# Patient Record
Sex: Female | Born: 1970
Health system: Southern US, Community
[De-identification: ages and names within clinical notes are randomized; demographics above are authoritative.]

## PROBLEM LIST (undated history)

## (undated) DIAGNOSIS — C801 Malignant (primary) neoplasm, unspecified: Secondary | ICD-10-CM

## (undated) DIAGNOSIS — Z1371 Encounter for nonprocreative screening for genetic disease carrier status: Secondary | ICD-10-CM

## (undated) DIAGNOSIS — I10 Essential (primary) hypertension: Secondary | ICD-10-CM

## (undated) DIAGNOSIS — K219 Gastro-esophageal reflux disease without esophagitis: Secondary | ICD-10-CM

## (undated) DIAGNOSIS — C50919 Malignant neoplasm of unspecified site of unspecified female breast: Secondary | ICD-10-CM

## (undated) DIAGNOSIS — Z720 Tobacco use: Secondary | ICD-10-CM

## (undated) HISTORY — DX: Essential (primary) hypertension: I10

## (undated) HISTORY — PX: MASTECTOMY: SHX3

## (undated) HISTORY — DX: Encounter for nonprocreative screening for genetic disease carrier status: Z13.71

## (undated) HISTORY — DX: Malignant (primary) neoplasm, unspecified: C80.1

---

## 2013-10-03 HISTORY — PX: BREAST EXCISIONAL BIOPSY: SUR124

## 2014-02-21 HISTORY — PX: BREAST BIOPSY: SHX20

## 2017-04-14 ENCOUNTER — Emergency Department: Payer: Self-pay

## 2017-04-14 ENCOUNTER — Inpatient Hospital Stay
Admission: EM | Admit: 2017-04-14 | Discharge: 2017-04-16 | DRG: 305 | Disposition: A | Discharge status: Home / Self Care | Payer: Self-pay | Attending: Internal Medicine | Admitting: Internal Medicine

## 2017-04-14 ENCOUNTER — Encounter: Payer: Self-pay | Admitting: Emergency Medicine

## 2017-04-14 DIAGNOSIS — R0600 Dyspnea, unspecified: Secondary | ICD-10-CM | POA: Diagnosis present

## 2017-04-14 DIAGNOSIS — I10 Essential (primary) hypertension: Secondary | ICD-10-CM | POA: Diagnosis present

## 2017-04-14 DIAGNOSIS — I200 Unstable angina: Secondary | ICD-10-CM

## 2017-04-14 DIAGNOSIS — F17200 Nicotine dependence, unspecified, uncomplicated: Secondary | ICD-10-CM | POA: Diagnosis present

## 2017-04-14 DIAGNOSIS — I161 Hypertensive emergency: Principal | ICD-10-CM | POA: Diagnosis present

## 2017-04-14 DIAGNOSIS — Z8249 Family history of ischemic heart disease and other diseases of the circulatory system: Secondary | ICD-10-CM

## 2017-04-14 DIAGNOSIS — Z823 Family history of stroke: Secondary | ICD-10-CM

## 2017-04-14 DIAGNOSIS — N289 Disorder of kidney and ureter, unspecified: Secondary | ICD-10-CM | POA: Diagnosis present

## 2017-04-14 LAB — BASIC METABOLIC PANEL
Anion gap: 6 (ref 5–15)
BUN: 22 mg/dL — AB (ref 6–20)
CHLORIDE: 103 mmol/L (ref 101–111)
CO2: 28 mmol/L (ref 22–32)
CREATININE: 1.24 mg/dL — AB (ref 0.44–1.00)
Calcium: 9.7 mg/dL (ref 8.9–10.3)
GFR calc Af Amer: 59 mL/min — ABNORMAL LOW (ref >60)
GFR, EST NON AFRICAN AMERICAN: 51 mL/min — AB (ref >60)
GLUCOSE: 94 mg/dL (ref 65–99)
POTASSIUM: 4.2 mmol/L (ref 3.5–5.1)
Sodium: 137 mmol/L (ref 135–145)

## 2017-04-14 LAB — CBC
HEMATOCRIT: 39.8 % (ref 35.0–47.0)
Hemoglobin: 13.3 g/dL (ref 12.0–16.0)
MCH: 29.6 pg (ref 26.0–34.0)
MCHC: 33.5 g/dL (ref 32.0–36.0)
MCV: 88.4 fL (ref 80.0–100.0)
Platelets: 188 10*3/uL (ref 150–440)
RBC: 4.5 MIL/uL (ref 3.80–5.20)
RDW: 13.9 % (ref 11.5–14.5)
WBC: 5.2 10*3/uL (ref 3.6–11.0)

## 2017-04-14 LAB — APTT: aPTT: >160 seconds (ref 24–36)

## 2017-04-14 LAB — GLUCOSE, CAPILLARY: Glucose-Capillary: 147 mg/dL — ABNORMAL HIGH (ref 65–99)

## 2017-04-14 LAB — MRSA PCR SCREENING: MRSA by PCR: NEGATIVE

## 2017-04-14 LAB — PROTIME-INR
INR: 1.19
Prothrombin Time: 15.2 seconds (ref 11.4–15.2)

## 2017-04-14 LAB — TROPONIN I: Troponin I: <0.03 ng/mL (ref <0.03)

## 2017-04-14 LAB — POCT PREGNANCY, URINE: PREG TEST UR: NEGATIVE

## 2017-04-14 LAB — HCG, QUANTITATIVE, PREGNANCY: hCG, Beta Chain, Quant, S: 1 m[IU]/mL (ref <5)

## 2017-04-14 MED ORDER — ASPIRIN 81 MG PO CHEW
324.0000 mg | CHEWABLE_TABLET | Freq: Once | ORAL | Status: AC
  Administered 2017-04-14: 324 mg via ORAL

## 2017-04-14 MED ORDER — NITROGLYCERIN IN D5W 200-5 MCG/ML-% IV SOLN
0.0000 ug/min | Freq: Once | INTRAVENOUS | Status: AC
  Administered 2017-04-14: 5 ug/min via INTRAVENOUS
  Filled 2017-04-14: qty 250

## 2017-04-14 MED ORDER — METOPROLOL TARTRATE 25 MG PO TABS: 25.0000 mg | ORAL_TABLET | Freq: Two times a day (BID) | ORAL | Status: DC

## 2017-04-14 MED ORDER — TRAZODONE HCL 50 MG PO TABS
25.0000 mg | ORAL_TABLET | Freq: Every evening | ORAL | Status: DC | PRN
  Administered 2017-04-14 – 2017-04-15 (×2): 25 mg via ORAL
  Filled 2017-04-14 (×2): qty 1

## 2017-04-14 MED ORDER — ACETAMINOPHEN 650 MG RE SUPP: 650.0000 mg | Freq: Four times a day (QID) | RECTAL | Status: DC | PRN

## 2017-04-14 MED ORDER — ACETAMINOPHEN 500 MG PO TABS
1000.0000 mg | ORAL_TABLET | Freq: Once | ORAL | Status: AC
  Administered 2017-04-14: 1000 mg via ORAL

## 2017-04-14 MED ORDER — ROSUVASTATIN CALCIUM 10 MG PO TABS
10.0000 mg | ORAL_TABLET | Freq: Every day | ORAL | Status: DC
  Administered 2017-04-14: 10 mg via ORAL
  Filled 2017-04-14: qty 1

## 2017-04-14 MED ORDER — MORPHINE SULFATE (PF) 2 MG/ML IV SOLN
INTRAVENOUS | Status: AC
  Filled 2017-04-14: qty 1

## 2017-04-14 MED ORDER — DOCUSATE SODIUM 100 MG PO CAPS
100.0000 mg | ORAL_CAPSULE | Freq: Two times a day (BID) | ORAL | Status: DC
  Administered 2017-04-15 – 2017-04-16 (×2): 100 mg via ORAL
  Filled 2017-04-14 (×2): qty 1

## 2017-04-14 MED ORDER — HYDRALAZINE HCL 20 MG/ML IJ SOLN
10.0000 mg | INTRAMUSCULAR | Status: DC | PRN
  Administered 2017-04-14: 10 mg via INTRAVENOUS
  Filled 2017-04-14: qty 1

## 2017-04-14 MED ORDER — ACETAMINOPHEN 325 MG PO TABS
650.0000 mg | ORAL_TABLET | Freq: Four times a day (QID) | ORAL | Status: DC | PRN
  Administered 2017-04-14 – 2017-04-15 (×2): 650 mg via ORAL
  Filled 2017-04-14 (×2): qty 2

## 2017-04-14 MED ORDER — ACETAMINOPHEN 500 MG PO TABS
ORAL_TABLET | ORAL | Status: AC
  Filled 2017-04-14: qty 2

## 2017-04-14 MED ORDER — NITROGLYCERIN 0.4 MG SL SUBL
0.4000 mg | SUBLINGUAL_TABLET | SUBLINGUAL | Status: DC | PRN
  Administered 2017-04-14: 0.4 mg via SUBLINGUAL

## 2017-04-14 MED ORDER — HEPARIN (PORCINE) IN NACL 100-0.45 UNIT/ML-% IJ SOLN
750.0000 [IU]/h | INTRAMUSCULAR | Status: DC
  Administered 2017-04-14: 650 [IU]/h via INTRAVENOUS
  Filled 2017-04-14: qty 250

## 2017-04-14 MED ORDER — ASPIRIN EC 81 MG PO TBEC
81.0000 mg | DELAYED_RELEASE_TABLET | Freq: Every day | ORAL | Status: DC
  Administered 2017-04-15 – 2017-04-16 (×2): 81 mg via ORAL
  Filled 2017-04-14 (×2): qty 1

## 2017-04-14 MED ORDER — KETOROLAC TROMETHAMINE 15 MG/ML IJ SOLN
15.0000 mg | Freq: Four times a day (QID) | INTRAMUSCULAR | Status: DC | PRN
  Administered 2017-04-14: 15 mg via INTRAVENOUS
  Filled 2017-04-14: qty 1

## 2017-04-14 MED ORDER — NITROGLYCERIN IN D5W 200-5 MCG/ML-% IV SOLN: 0.0000 ug/min | Freq: Once | INTRAVENOUS | Status: DC

## 2017-04-14 MED ORDER — MORPHINE SULFATE (PF) 2 MG/ML IV SOLN
2.0000 mg | Freq: Once | INTRAVENOUS | Status: AC
  Administered 2017-04-14: 2 mg via INTRAVENOUS

## 2017-04-14 MED ORDER — HEPARIN SODIUM (PORCINE) 1000 UNIT/ML IJ SOLN
4000.0000 [IU] | Freq: Once | INTRAMUSCULAR | Status: AC
  Administered 2017-04-14: 4000 [IU] via INTRAVENOUS

## 2017-04-14 MED ORDER — AMLODIPINE BESYLATE 10 MG PO TABS
10.0000 mg | ORAL_TABLET | Freq: Every day | ORAL | Status: DC
  Administered 2017-04-14: 10 mg via ORAL
  Filled 2017-04-14: qty 1

## 2017-04-14 MED ORDER — IOPAMIDOL (ISOVUE-370) INJECTION 76%
75.0000 mL | Freq: Once | INTRAVENOUS | Status: AC | PRN
  Administered 2017-04-14: 75 mL via INTRAVENOUS

## 2017-04-14 MED ORDER — HYDRALAZINE HCL 10 MG PO TABS
10.0000 mg | ORAL_TABLET | Freq: Three times a day (TID) | ORAL | Status: DC
  Administered 2017-04-14 – 2017-04-15 (×2): 10 mg via ORAL
  Filled 2017-04-14 (×2): qty 1

## 2017-04-14 MED ORDER — HEPARIN SODIUM (PORCINE) 5000 UNIT/ML IJ SOLN: 4000.0000 [IU] | Freq: Once | INTRAMUSCULAR | Status: DC

## 2017-04-14 NOTE — ED Notes (Addendum)
Repeat EKG performed by this tech and signed by MD Alfred Levins.

## 2017-04-14 NOTE — H&P (Signed)
[facility] [place] NAME: Cassandra Tran    MR#:  [id number]  DATE OF BIRTH:  October 16, 1970  DATE OF ADMISSION:  04/14/2017  PRIMARY CARE PHYSICIAN: Patient, No Pcp Per   REQUESTING/REFERRING PHYSICIAN:Dr,Veronese  CHIEF COMPLAINT: Chest pain    Chief Complaint  Patient presents with  . Chest Pain  . Shortness of Breath    HISTORY OF PRESENT ILLNESS:  Cassandra Tran  is a 46 y.o. female withPast medical history comes in with left-sided chest pain went to the left arm started this afternoon. Patient also has shortness of breath as well as with chest pain. Had some nausea but no spreading. No cough or fever. Patient has premature history of coronary artery disease, mother died at the age of 27 with massive heart attack. Patient's blood pressure very elevated when she came it was 210/129, ER physician did EKG and it was concerning for ST elevations in the anterior leads so code STEMI was activated, Dr.Arida s not convinced about MI, patient blood pressure is improved with treatment, and EKG repeated showed normal sinus rhythm, resolution of ST elevations in V2 and persistent in v3 a with normal T waves. Shunt is on nitro drip, heparin drip and is being admitted to ICU for chest pain, hypertensive emergency, possible acute coronary syndrome. First set of troponins are negative and second set of troponin needs to be drawn at around 8 PM. Patient is chest pain-free but has headache. No shortness of breath. Otherwise has no complaints now.  PAST MEDICAL HISToRY:  History reviewed. No pertinent past medical history.  PAST SURGICAL HISTOIRY:  History reviewed. No pertinent surgical history.  SOCIAL HISTORY:   Social History  Substance Use Topics  . Smoking status: Current Some Day Smoker  . Smokeless tobacco: Never Used  . Alcohol use No    FAMILY HISTORY:  No family history on file.  DRUG ALLERGIES:  No Known Allergies  REVIEW OF  SYSTEMS:  CONSTITUTIONAL: No fever, fatigue or weakness.  EYES: No blurred or double vision.  EARS, NOSE, AND THROAT: No tinnitus or ear pain.  RESPIRATORY: Left-sided chest pain today. CARDIOVASCULAR: No chest pain, orthopnea, edema.  GASTROINTESTINAL: No nausea, vomiting, diarrhea or abdominal pain.  GENITOURINARY: No dysuria, hematuria.  ENDOCRINE: No polyuria, nocturia,  HEMATOLOGY: No anemia, easy bruising or bleeding SKIN: No rash or lesion. MUSCULOSKELETAL: No joint pain or arthritis.   NEUROLOGIC: No tingling, numbness, weakness.  PSYCHIATRY: No anxiety or depression.   MEDICATIONS AT HOME:   Prior to Admission medications   Not on File      VITAL SIGNS:  Blood pressure (!) 151/113, pulse 65, temperature 99.8 F (37.7 C), temperature source Oral, resp. rate 13, height 5\' 3"  (1.6 m), weight 54.4 kg (120 lb), last menstrual period 04/03/2017, SpO2 100 %.  PHYSICAL EXAMINATION:  GENERAL:  46 y.o.-year-old patient lying in the bed with no acute distress.  EYES: Pupils equal, round, reactive to light and accommodation. No scleral icterus. Extraocular muscles intact.  HEENT: Head atraumatic, normocephalic. Oropharynx and nasopharynx clear.  NECK:  Supple, no jugular venous distention. No thyroid enlargement, no tenderness.  LUNGS: Normal breath sounds bilaterally, no wheezing, rales,rhonchi or crepitation. No use of accessory muscles of respiration.  CARDIOVASCULAR: S1, S2 normal. No murmurs, rubs, or gallops.  ABDOMEN: Soft, nontender, nondistended. Bowel sounds present. No organomegaly or mass.  EXTREMITIES: No pedal edema, cyanosis, or clubbing.  NEUROLOGIC: Cranial nerves II through XII are intact. Muscle strength  5/5 in all extremities. Sensation intact. Gait not checked.  PSYCHIATRIC: The patient is alert and oriented x 3.  SKIN: No obvious rash, lesion, or ulcer.   LABORATORY PANEL:   CBC  Recent Labs Lab 04/14/17 1604  WBC 5.2  HGB 13.3  HCT 39.8  PLT 188    ------------------------------------------------------------------------------------------------------------------  Chemistries   Recent Labs Lab 04/14/17 1604  NA 137  K 4.2  CL 103  CO2 28  GLUCOSE 94  BUN 22*  CREATININE 1.24*  CALCIUM 9.7   ------------------------------------------------------------------------------------------------------------------  Cardiac Enzymes  Recent Labs Lab 04/14/17 1604  TROPONINI <0.03   ------------------------------------------------------------------------------------------------------------------  RADIOLOGY:  Dg Chest Portable 1 View  Result Date: 04/14/2017 CLINICAL DATA:  Sharp chest pain, began this morning while watching television, worsened when taking a deep breath, pain radiating down LEFT arm, smoker EXAM: PORTABLE CHEST 1 VIEW COMPARISON:  Portable exam 1645 hours without priors for comparison FINDINGS: External pacing leads project over chest. Normal heart size, mediastinal contours, and pulmonary vascularity. Lungs clear. No pleural effusion or pneumothorax. Bones unremarkable. IMPRESSION: No acute abnormalities. Electronically Signed   By: Lavonia Dana M.D.   On: 04/14/2017 16:54   Ct Angio Chest Aorta W And/or Wo Contrast  Result Date: 04/14/2017 CLINICAL DATA:  Onset of severe chest pain this morning while watching television worse with taking a deep breath and radiating down into LEFT arm, severely hypertensive, smoker EXAM: CT ANGIOGRAPHY CHEST WITH CONTRAST TECHNIQUE: Multidetector CT imaging of the chest was performed using the standard protocol during bolus administration of intravenous contrast. Multiplanar CT image reconstructions and MIPs were obtained to evaluate the vascular anatomy. CONTRAST:  75 cc Isovue 370 IV COMPARISON:  None FINDINGS: Cardiovascular: On precontrast images, minimal atherosclerotic calcification of aorta is noted. No high attenuation mural crest since seen within aorta to suggest intramural  hemorrhage. Aorta normal caliber. Normal aortic enhancement following contrast without aneurysm or dissection. Pulmonary arteries appear patent without gross evidence of pulmonary embolism. Mediastinum/Nodes: Esophagus unremarkable. No thoracic adenopathy. Base of cervical region normal appearance. Lungs/Pleura: Lungs clear. No pulmonary infiltrate, pleural effusion or pneumothorax. Upper Abdomen: Hypervascular focus medial segment LEFT lobe liver 22 x 11 mm nonspecific but potentially an atypical hemangioma. Musculoskeletal: No acute osseous findings. Review of the MIP images confirms the above findings. IMPRESSION: No acute intrathoracic abnormalities. Hypervascular focus in the liver on arterial phase imaging, 22 x 11 mm, nonspecific but potentially could represent an atypical hemangioma ; recommend followup non emergent MR imaging with and without contrast to characterize. This recommendation follows ACR consensus guidelines: Management of Incidental Liver Lesions on CT: A White Paper of the ACR Incidental Findings Committee. J Am Coll Radiol 2017; 62:2297-9892. Aortic Atherosclerosis (ICD10-I70.0). Electronically Signed   By: Lavonia Dana M.D.   On: 04/14/2017 17:52    EKG:   Orders placed or performed during the hospital encounter of 04/14/17  . EKG 12-Lead  . EKG 12-Lead  . ED EKG within 10 minutes  . ED EKG within 10 minutes  . EKG 12-Lead  . EKG 12-Lead   EKG at 4 PM showed normal sinus rhythm with a rate of 62 bpm, incomplete right bundle branch block, right axis deviation, ST elevation in anterior leads with flattening of the inferior leads  Repeat EKG at 4:15 PM showed normal sinus rhythm with a rate of 87 bpm, right axis deviation, resolution of ST elevations in V2, persistent in V3 with normal T waves now in the inferior leads.  IMPRESSION AND PLAN:  46 year old female patient with no past medical history and has history of premature coronary artery disease comes in with chest pain,  shortness of breath with evidence of hypertensiveemergency: Code STEMI was called but the non-convincing EKG as per cardiology, admitted to ICU started on heparin drip, nitro drip, alert troponins. Continue aspirin, beta blockers, high intensity statins, check fasting LIPIDS. CT angio chest is done to rule out dissection because of elevated high blood pressure, patient CT chest is negative for dissection.  D/w husband  All the records are reviewed and case discussed with ED provider. Management plans discussed with the patient, family and they are in agreement.  CODE STATUS: full  TOTAL TIME TAKING CARE OF THIS PATIENT: 55 minutes.    Epifanio Lesches M.D on 04/14/2017 at 6:32 PM  Between 7am to 6pm - Pager - [phone]  After 6pm go to [email or web address] - password [email or web address] [facility]  Office  [phone]  CC: Primary care physician; Patient, No Pcp Per  Note: This dictation was prepared with Dragon dictation along with smaller phrase technology. Any transcriptional errors that result from this process are unintentional.

## 2017-04-14 NOTE — ED Triage Notes (Signed)
Pt with sharp chest pain started this am while watching TV worse when taking a deep breath in. Also radiates down into left arm.

## 2017-04-14 NOTE — ED Notes (Signed)
Patient has been determined to be telemetry appropriate.  Will recall report to telemetry when the room is ready.

## 2017-04-14 NOTE — ED Notes (Signed)
Patient transported to CT by this RN 

## 2017-04-14 NOTE — Progress Notes (Signed)
Chaplin received a page Code Stemi.  Chaplin Talked with patient and family patient complained of headache. Code stemi was canceled patient Was going to be admitted to the hospital and she stated she would call the [facility] if needed.

## 2017-04-14 NOTE — ED Provider Notes (Addendum)
[facility] Emergency Department Provider Note  ____________________________________________  Time seen: Approximately 4:25 PM  I have reviewed the triage vital signs and the nursing notes.   HISTORY  Chief Complaint Chest Pain and Shortness of Breath   HPI Cassandra Tran is a 46 y.o. female with a history of smoking or family history of MI who presents for evaluation of chest pain and shortness of breath. Patient reports the chest pain started this morning after she woke up she describes as a sharp pain located in the center of her chest that radiates down to her left arm. The pain has been constant and currently 7 out of 10. She is also complaining of shortness of breath. Patient reports that her mother had a heart attack in her 30s. Patient reports that she smokes. She denies drug use. She denies personal or family history blood clots, recent travel or immobilization, leg pain or swelling, hemoptysis, or exogenous hormones. She denies paresthesias, weakness or numbness of her extremities. She denies diaphoresis, nausea, vomiting, dizziness. She hasn't taken anything at home for the pain.  History reviewed. No pertinent past medical history.  There are no active problems to display for this patient.   History reviewed. No pertinent surgical history.  Prior to Admission medications   Not on File    Allergies Patient has no known allergies.  No family history on file.  Social History Social History  Substance Use Topics  . Smoking status: Current Some Day Smoker  . Smokeless tobacco: Never Used  . Alcohol use No    Review of Systems  Constitutional: Negative for fever. Eyes: Negative for visual changes. ENT: Negative for sore throat. Neck: No neck pain  Cardiovascular: + chest pain. Respiratory: + shortness of breath. Gastrointestinal: Negative for abdominal pain, vomiting or diarrhea. Genitourinary: Negative for dysuria. Musculoskeletal:  Negative for back pain. Skin: Negative for rash. Neurological: Negative for headaches, weakness or numbness. Psych: No SI or HI  ____________________________________________   PHYSICAL EXAM:  VITAL SIGNS: ED Triage Vitals  Enc Vitals Group     BP 04/14/17 1600 (!) 210/129     Pulse Rate 04/14/17 1600 66     Resp 04/14/17 1600 18     Temp 04/14/17 1600 99.8 F (37.7 C)     Temp Source 04/14/17 1600 Oral     SpO2 04/14/17 1600 100 %     Weight 04/14/17 1601 120 lb (54.4 kg)     Height 04/14/17 1601 5\' 3"  (1.6 m)     Head Circumference --      Peak Flow --      Pain Score 04/14/17 1604 7     Pain Loc --      Pain Edu? --      Excl. in [place]? --     Constitutional: Alert and oriented. Well appearing and in no apparent distress. HEENT:      Head: Normocephalic and atraumatic.         Eyes: Conjunctivae are normal. Sclera is non-icteric.       Mouth/Throat: Mucous membranes are moist.       Neck: Supple with no signs of meningismus. Cardiovascular: Regular rate and rhythm. No murmurs, gallops, or rubs. 2+ symmetrical distal pulses are present in all extremities. No JVD. Respiratory: Normal respiratory effort. Lungs are clear to auscultation bilaterally. No wheezes, crackles, or rhonchi.  Gastrointestinal: Soft, non tender, and non distended with positive bowel sounds. No rebound or guarding. Musculoskeletal: Nontender with normal range  of motion in all extremities. No edema, cyanosis, or erythema of extremities. Neurologic: Normal speech and language. Face is symmetric. Moving all extremities. No gross focal neurologic deficits are appreciated. Skin: Skin is warm, dry and intact. No rash noted. Psychiatric: Mood and affect are normal. Speech and behavior are normal.  ____________________________________________   LABS (all labs ordered are listed, but only abnormal results are displayed)  Labs Reviewed  BASIC METABOLIC PANEL - Abnormal; Notable for the following:        Result Value   BUN 22 (*)    Creatinine, Ser 1.24 (*)    GFR calc non Af Amer 51 (*)    GFR calc Af Amer 59 (*)    All other components within normal limits  APTT - Abnormal; Notable for the following:    aPTT >160 (*)    All other components within normal limits  CBC  TROPONIN I  PROTIME-INR  HCG, QUANTITATIVE, PREGNANCY  HEPARIN LEVEL (UNFRACTIONATED)   ____________________________________________  EKG  ED ECG REPORT I, Rudene Re, the attending physician, personally viewed and interpreted this ECG.  15:59 - normal sinus rhythm, rate of 62, incomplete right bundle branch block, normal QTc interval, right axis deviation, ST elevations on anterior leads with flattening on inferior leads.   16:14 - normal sinus rhythm, rate of 87, normal intervals, right axis deviation, resolution of ST elevation in V2 persistent in V3 with normal T waves now in inferior leads. ____________________________________________  RADIOLOGY  CXR: negative  CTA chest:  No acute intrathoracic abnormalities.  Hypervascular focus in the liver on arterial phase imaging, 22 x 11 mm, nonspecific but potentially could represent an atypical hemangioma ; recommend followup non emergent MR imaging with and without contrast to characterize. ____________________________________________   PROCEDURES  Procedure(s) performed: None Procedures Critical Care performed: yes  CRITICAL CARE Performed by: Rudene Re  ?  Total critical care time: 40 min  Critical care time was exclusive of separately billable procedures and treating other patients.  Critical care was necessary to treat or prevent imminent or life-threatening deterioration.  Critical care was time spent personally by me on the following activities: development of treatment plan with patient and/or surrogate as well as nursing, discussions with consultants, evaluation of patient's response to treatment, examination of patient,  obtaining history from patient or surrogate, ordering and performing treatments and interventions, ordering and review of laboratory studies, ordering and review of radiographic studies, pulse oximetry and re-evaluation of patient's condition.  ____________________________________________   INITIAL IMPRESSION / ASSESSMENT AND PLAN / ED COURSE  46 y.o. female with a history of smoking or family history of MI who presents for evaluation of chest pain and shortness of breath. Patient EKG upon arrival concerning for a STEMI with a ST elevations in V1 through V3 and flattening in the inferior leads. Patient severely hypertensive with systolics in the 299M. Code STEMI was called upon arrival. PERC negative. Intact pulses with no neuro deficits making dissection less likely.  Clinical Course as of Apr 14 1804  Fri Apr 14, 2017  1612 Spoke with Dr. Fletcher Anon who looked at EKGs and does not believe patient meets STEMI criteria. He recommended starting patient on heparin, patient had already received bolus and is waiting drip, control BP and as long as patient is pain free he will hold off on cath at this time. Patient pain free after 2 sublingual nitro. Will start on nitro drip due to significant elevated BP. Patient given ASA  [CV]  Clinical Course User Index [CV] Alfred Levins, [place], MD    _________________________ 6:04 PM on 04/14/2017 -----------------------------------------  Patient was sent for CT angiogram to rule out dissection due to her severely elevated blood pressure. Patient is on nitro drip at 25 g and has no pain at this time. First troponin is negative. CT is negative. Patient's inability admitted to the hospitalist service. Patient is currently on nitro drip and heparin.  Pertinent labs & imaging results that were available during my care of the patient were reviewed by me and considered in my medical decision making (see chart for  details).    ____________________________________________   FINAL CLINICAL IMPRESSION(S) / ED DIAGNOSES  Final diagnoses:  Unstable angina ([place])  Hypertensive emergency      NEW MEDICATIONS STARTED DURING THIS VISIT:  New Prescriptions   No medications on file     Note:  This document was prepared using Dragon voice recognition software and may include unintentional dictation errors.    Alfred Levins, [place], MD 04/14/17 [place], [place], [place] 04/14/17 [id number]

## 2017-04-14 NOTE — ED Notes (Signed)
Returned from CT.

## 2017-04-14 NOTE — Consult Note (Signed)
PULMONARY / CRITICAL CARE MEDICINE   Name: Cassandra Tran MRN: [id number] DOB: June 28, 1971    ADMISSION DATE:  04/14/2017   CONSULTATION DATE:  04/14/17  REFERRING MD: Vianne Bulls  REASON: Hypertensive emergency  CHIEF COMPLAINT:  Chest pain  HISTORY OF PRESENT ILLNESS:   This is a 46 year old African-American female with a history of tobacco use disorder and no other past medical history who presented to the ED with complaints of chest pain and shortness of breath. She was found to be severely hypertensive with a blood pressure of 210/129 and a heart rate of 66. She was admitted to the ICU for management of hypertensive emergency. Patient is currently awake and complaining of a headache which she rates as a 10 on 10, no associated symptoms. Reports significant improvement in chest pain and shortness of breath.   PAST MEDICAL HISTORY :  She  has no past medical history on file.  PAST SURGICAL HISTORY: She  has no past surgical history on file.  No Known Allergies  No current facility-administered medications on file prior to encounter.    No current outpatient prescriptions on file prior to encounter.    FAMILY HISTORY:  Her has no family status information on file.    SOCIAL HISTORY: She  reports that she has been smoking.  She has never used smokeless tobacco. She reports that she uses drugs, including Marijuana. She reports that she does not drink alcohol.  REVIEW OF SYSTEMS:   Constitutional: Negative for fever and chills.  HENT: Negative for congestion and rhinorrhea.  Eyes: Negative for redness and visual disturbance.  Respiratory: Negative for shortness of breath and wheezing.  Cardiovascular: Negative for chest pain and palpitations.  Gastrointestinal: Negative  for nausea , vomiting and abdominal pain and  Loose stools Genitourinary: Negative for dysuria and urgency.  Endocrine: Denies polyuria, polyphagia and heat intolerance Musculoskeletal: Negative for myalgias  and arthralgias.  Skin: Negative for pallor and wound.  Neurological: Negative for dizziness but positive for headaches   SUBJECTIVE:   VITAL SIGNS: BP (!) 149/108   Pulse 66   Temp 99.8 F (37.7 C) (Oral)   Resp (!) 21   Ht 5\' 3"  (1.6 m)   Wt 120 lb (54.4 kg)   LMP 04/03/2017   SpO2 99%   BMI 21.26 kg/m   HEMODYNAMICS:    VENTILATOR SETTINGS:    INTAKE / OUTPUT: No intake/output data recorded.  PHYSICAL EXAMINATION: General: No acute distress Neuro:  Alert and oriented 4, speech is normal, cranial nerves intact, grip strength in upper and lower extremities intact HEENT:  PERRLA, trachea midline, no JVD Cardiovascular:  RRR, S1, S2, no murmur, regurg or gallop Lungs:  Clear to auscultation bilaterally Abdomen: Nondistended, normal bowel sounds in all 4 quadrants, palpation reveals no organomegaly Musculoskeletal: Positive range of motion in upper and lower extremities Skin:  Skin is warm and dry  LABS:  BMET  Recent Labs Lab 04/14/17 1604  NA 137  K 4.2  CL 103  CO2 28  BUN 22*  CREATININE 1.24*  GLUCOSE 94    Electrolytes  Recent Labs Lab 04/14/17 1604  CALCIUM 9.7    CBC  Recent Labs Lab 04/14/17 1604  WBC 5.2  HGB 13.3  HCT 39.8  PLT 188    Coag's  Recent Labs Lab 04/14/17 1618  APTT >160*  INR 1.19    Sepsis Markers No results for input(s): LATICACIDVEN, PROCALCITON, O2SATVEN in the last 168 hours.  ABG No results for input(s): PHART,  PCO2ART, PO2ART in the last 168 hours.  Liver Enzymes No results for input(s): AST, ALT, ALKPHOS, BILITOT, ALBUMIN in the last 168 hours.  Cardiac Enzymes  Recent Labs Lab 04/14/17 1604 04/14/17 1956  TROPONINI <0.03 <0.03    Glucose  Recent Labs Lab 04/14/17 2050  GLUCAP 147*    Imaging Dg Chest Portable 1 View  Result Date: 04/14/2017 CLINICAL DATA:  Sharp chest pain, began this morning while watching television, worsened when taking a deep breath, pain radiating down  LEFT arm, smoker EXAM: PORTABLE CHEST 1 VIEW COMPARISON:  Portable exam 1645 hours without priors for comparison FINDINGS: External pacing leads project over chest. Normal heart size, mediastinal contours, and pulmonary vascularity. Lungs clear. No pleural effusion or pneumothorax. Bones unremarkable. IMPRESSION: No acute abnormalities. Electronically Signed   By: Lavonia Dana M.D.   On: 04/14/2017 16:54   Ct Angio Chest Aorta W And/or Wo Contrast  Result Date: 04/14/2017 CLINICAL DATA:  Onset of severe chest pain this morning while watching television worse with taking a deep breath and radiating down into LEFT arm, severely hypertensive, smoker EXAM: CT ANGIOGRAPHY CHEST WITH CONTRAST TECHNIQUE: Multidetector CT imaging of the chest was performed using the standard protocol during bolus administration of intravenous contrast. Multiplanar CT image reconstructions and MIPs were obtained to evaluate the vascular anatomy. CONTRAST:  75 cc Isovue 370 IV COMPARISON:  None FINDINGS: Cardiovascular: On precontrast images, minimal atherosclerotic calcification of aorta is noted. No high attenuation mural crest since seen within aorta to suggest intramural hemorrhage. Aorta normal caliber. Normal aortic enhancement following contrast without aneurysm or dissection. Pulmonary arteries appear patent without gross evidence of pulmonary embolism. Mediastinum/Nodes: Esophagus unremarkable. No thoracic adenopathy. Base of cervical region normal appearance. Lungs/Pleura: Lungs clear. No pulmonary infiltrate, pleural effusion or pneumothorax. Upper Abdomen: Hypervascular focus medial segment LEFT lobe liver 22 x 11 mm nonspecific but potentially an atypical hemangioma. Musculoskeletal: No acute osseous findings. Review of the MIP images confirms the above findings. IMPRESSION: No acute intrathoracic abnormalities. Hypervascular focus in the liver on arterial phase imaging, 22 x 11 mm, nonspecific but potentially could represent  an atypical hemangioma ; recommend followup non emergent MR imaging with and without contrast to characterize. This recommendation follows ACR consensus guidelines: Management of Incidental Liver Lesions on CT: A White Paper of the ACR Incidental Findings Committee. J Am Coll Radiol 2017; 05:3976-7341. Aortic Atherosclerosis (ICD10-I70.0). Electronically Signed   By: Lavonia Dana M.D.   On: 04/14/2017 17:52     STUDIES:  2-D echo  CULTURES: None  ANTIBIOTICS: None  SIGNIFICANT EVENTS: 04/14/2017: ED with hypertensive crisis  LINES/TUBES: Peripheral IVs  DISCUSSION: 46 year old African-American female with an extensive family history of hypertension and sudden cardiac death presenting with new onset hypertension and hypertensive emergency  ASSESSMENT  Acute dyspnea.-No signs of volume overload Hypertensive emergency Chest pain-resolved PLAN Discontinue nitroglycerin infusion. Stat hydralazine 10 mg by mouth every 8 hours, hold for systolic blood pressure less than 120. Hydralazine 10 mg IV when necessary for systolic blood pressure greater than 170. Norvasc 10 mg by mouth daily 2-D echo Serial cardiac enzymes and EKG Smoking cessation and lifestyle modification advice given Check lipid panel and hemoglobin A1c. Hemodynamic monitoring per ICU protocol If blood pressure improved; Patient will be transferred out of the ICU in the morning  FAMILY  - Updates: Husband at bedside. Plan of care reviewed with patient and husband  - Inter-disciplinary family meet or Palliative Care meeting due by:  day [place]  Abran Duke ANP-BC Pulmonary and [place] Pager [phone] or [phone]  04/14/2017, 10:18 PM   Merton Border, MD PCCM service Mobile [phone] Pager [phone] 04/15/2017 10:26 AM

## 2017-04-14 NOTE — Progress Notes (Signed)
ANTICOAGULATION CONSULT NOTE - Initial Consult  Pharmacy Consult for heparin drip dosing and monitoring  Indication: chest pain/ACS  No Known Allergies  Patient Measurements: Height: 5\' 3"  (160 cm) Weight: 120 lb (54.4 kg) IBW/kg (Calculated) : 52.4  Vital Signs: Temp: 99.8 F (37.7 C) (07/13 1600) Temp Source: Oral (07/13 1600) BP: 210/129 (07/13 1600) Pulse Rate: 66 (07/13 1600)  Labs:  Recent Labs  04/14/17 1604  HGB 13.3  HCT 39.8  PLT 188    CrCl cannot be calculated (No order found.).   Medical History: History reviewed. No pertinent past medical history.   Assessment: 46 yo female with sharp chest pain radiating to left arm. Pharmacy consulted for heparin drip dosing and monitoring for ACS.   Goal of Therapy:  Heparin level 0.3-0.7 units/ml Monitor platelets by anticoagulation protocol: Yes   Plan:  4000 units bolus x 1 already given  Ordered baseline labs Start heparin infusion at 650 units/hr Check anti-Xa level in 6 hours and daily while on heparin Continue to monitor H&H and platelets  Pernell Dupre, PharmD, BCPS Clinical Pharmacist 04/14/2017 4:24 PM

## 2017-04-14 NOTE — ED Notes (Signed)
BP retaken 220/128. Monica,RN aware.

## 2017-04-14 NOTE — ED Notes (Signed)
Patient has been deemed ICU appropriate and is being taken there now.

## 2017-04-15 ENCOUNTER — Inpatient Hospital Stay (HOSPITAL_COMMUNITY)
Admit: 2017-04-15 | Discharge: 2017-04-15 | Disposition: A | Discharge status: Home / Self Care | Payer: Self-pay | Attending: Adult Health | Admitting: Adult Health

## 2017-04-15 ENCOUNTER — Encounter: Payer: Self-pay | Admitting: Adult Health

## 2017-04-15 DIAGNOSIS — I361 Nonrheumatic tricuspid (valve) insufficiency: Secondary | ICD-10-CM

## 2017-04-15 DIAGNOSIS — I161 Hypertensive emergency: Principal | ICD-10-CM

## 2017-04-15 DIAGNOSIS — I10 Essential (primary) hypertension: Secondary | ICD-10-CM

## 2017-04-15 LAB — MAGNESIUM: MAGNESIUM: 2 mg/dL (ref 1.7–2.4)

## 2017-04-15 LAB — CBC
HEMATOCRIT: 36.1 % (ref 35.0–47.0)
Hemoglobin: 12 g/dL (ref 12.0–16.0)
MCH: 28.9 pg (ref 26.0–34.0)
MCHC: 33.2 g/dL (ref 32.0–36.0)
MCV: 87.1 fL (ref 80.0–100.0)
PLATELETS: 171 10*3/uL (ref 150–440)
RBC: 4.14 MIL/uL (ref 3.80–5.20)
RDW: 14.1 % (ref 11.5–14.5)
WBC: 5.6 10*3/uL (ref 3.6–11.0)

## 2017-04-15 LAB — BASIC METABOLIC PANEL
Anion gap: 7 (ref 5–15)
Anion gap: 6 (ref 5–15)
BUN: 15 mg/dL (ref 6–20)
BUN: 13 mg/dL (ref 6–20)
CHLORIDE: 103 mmol/L (ref 101–111)
CO2: 24 mmol/L (ref 22–32)
CO2: 27 mmol/L (ref 22–32)
Calcium: 9.2 mg/dL (ref 8.9–10.3)
Calcium: 9.4 mg/dL (ref 8.9–10.3)
Chloride: 103 mmol/L (ref 101–111)
Creatinine, Ser: 0.91 mg/dL (ref 0.44–1.00)
Creatinine, Ser: 1 mg/dL (ref 0.44–1.00)
GFR calc non Af Amer: >60 mL/min (ref >60)
Glucose, Bld: 126 mg/dL — ABNORMAL HIGH (ref 65–99)
Glucose, Bld: 96 mg/dL (ref 65–99)
POTASSIUM: 3.8 mmol/L (ref 3.5–5.1)
POTASSIUM: 3.5 mmol/L (ref 3.5–5.1)
SODIUM: 136 mmol/L (ref 135–145)
SODIUM: 134 mmol/L — AB (ref 135–145)

## 2017-04-15 LAB — ECHOCARDIOGRAM COMPLETE
Height: 63 in
Weight: 2127 oz

## 2017-04-15 LAB — TROPONIN I: Troponin I: <0.03 ng/mL (ref <0.03)

## 2017-04-15 LAB — HEPARIN LEVEL (UNFRACTIONATED)
HEPARIN UNFRACTIONATED: 0.31 [IU]/mL (ref 0.30–0.70)
HEPARIN UNFRACTIONATED: 0.23 [IU]/mL — AB (ref 0.30–0.70)

## 2017-04-15 LAB — GLUCOSE, CAPILLARY: GLUCOSE-CAPILLARY: 91 mg/dL (ref 65–99)

## 2017-04-15 LAB — LIPID PANEL
CHOL/HDL RATIO: 2.1 ratio
Cholesterol: 116 mg/dL (ref 0–200)
HDL: 54 mg/dL (ref >40)
LDL Cholesterol: 45 mg/dL (ref 0–99)
TRIGLYCERIDES: 84 mg/dL (ref <150)
VLDL: 17 mg/dL (ref 0–40)

## 2017-04-15 MED ORDER — HEPARIN BOLUS VIA INFUSION
800.0000 [IU] | Freq: Once | INTRAVENOUS | Status: AC
  Administered 2017-04-15: 800 [IU] via INTRAVENOUS
  Filled 2017-04-15: qty 800

## 2017-04-15 MED ORDER — METOPROLOL TARTRATE 25 MG PO TABS
25.0000 mg | ORAL_TABLET | Freq: Two times a day (BID) | ORAL | Status: DC
  Administered 2017-04-15 – 2017-04-16 (×3): 25 mg via ORAL
  Filled 2017-04-15 (×3): qty 1

## 2017-04-15 MED ORDER — AMLODIPINE BESYLATE 5 MG PO TABS: 5.0000 mg | ORAL_TABLET | Freq: Every day | ORAL | Status: DC

## 2017-04-15 MED ORDER — SODIUM CHLORIDE 0.9% FLUSH: 3.0000 mL | Freq: Two times a day (BID) | INTRAVENOUS | Status: DC

## 2017-04-15 MED ORDER — AMLODIPINE BESYLATE 5 MG PO TABS
5.0000 mg | ORAL_TABLET | Freq: Every day | ORAL | Status: DC
  Administered 2017-04-15 – 2017-04-16 (×2): 5 mg via ORAL
  Filled 2017-04-15 (×2): qty 1

## 2017-04-15 NOTE — Progress Notes (Signed)
ANTICOAGULATION CONSULT NOTE - Initial Consult  Pharmacy Consult for heparin drip dosing and monitoring  Indication: chest pain/ACS  No Known Allergies  Patient Measurements: Height: 5\' 3"  (160 cm) Weight: 120 lb (54.4 kg) IBW/kg (Calculated) : 52.4  Vital Signs: Temp: 98.3 F (36.8 C) (07/13 2200) Temp Source: Oral (07/13 2200) BP: 115/73 (07/14 0100) Pulse Rate: 73 (07/14 0100)  Labs:  Recent Labs  04/14/17 1604 04/14/17 1618 04/14/17 1956 04/15/17 0043  HGB 13.3  --   --  12.0  HCT 39.8  --   --  36.1  PLT 188  --   --  171  APTT  --  >160*  --   --   LABPROT  --  15.2  --   --   INR  --  1.19  --   --   HEPARINUNFRC  --   --   --  0.23*  CREATININE 1.24*  --   --   --   TROPONINI <0.03  --  <0.03  --     Estimated Creatinine Clearance: 46.4 mL/min (A) (by C-G formula based on SCr of 1.24 mg/dL (H)).   Medical History: History reviewed. No pertinent past medical history.   Assessment: 46 yo female with sharp chest pain radiating to left arm. Pharmacy consulted for heparin drip dosing and monitoring for ACS.   Goal of Therapy:  Heparin level 0.3-0.7 units/ml Monitor platelets by anticoagulation protocol: Yes   Plan:  4000 units bolus x 1 already given  Ordered baseline labs Start heparin infusion at 650 units/hr Check anti-Xa level in 6 hours and daily while on heparin Continue to monitor H&H and platelets  7/13 0100 heparin level 0.23. 800 unit bolus and increase rate to 750 units/hr. Recheck in 6 hours.  Eloise Harman, PharmD, BCPS Clinical Pharmacist 04/15/2017 1:16 AM

## 2017-04-15 NOTE — Progress Notes (Signed)
Pt new to antihypertensives. Metoprolol 25 given at 0800. With good response.  Norvasc 5mg  po at 1050. Pt and her husband have automatic bp cuff at home. Instructed to keep a log of her bp  and HR twice a day for a week. Instructed to get up slowly and sit for a few seconds before getting up from lying down. Pt needs a PCP for follow up. She currently does not have PCP. Iv right AC removed per pt request that" it hurts". Good blood return on #20 in LAC and flushes easily.  Report called to Richardson Landry RN on 2A.  Prepare for transfer per wch.

## 2017-04-15 NOTE — Progress Notes (Signed)
[facility] [place] NAME: Cassandra Tran    MR#:  [id number]  DATE OF BIRTH:  December 24, 1970  SUBJECTIVE: Admitted yesterday for chest pain, hypertensive emergency. Was on nitro drip, heparin drip, admitted to ICU. This morning she is off the nitro drip, denies any chest pain or headache. No shortness of breath. Blood pressure is better.   CHIEF COMPLAINT:   Chief Complaint  Patient presents with  . Chest Pain  . Shortness of Breath    REVIEW OF SYSTEMS:    Review of Systems  Constitutional: Negative for chills and fever.  HENT: Negative for hearing loss.   Eyes: Negative for blurred vision, double vision and photophobia.  Respiratory: Negative for cough, hemoptysis and shortness of breath.   Cardiovascular: Negative for palpitations, orthopnea and leg swelling.  Gastrointestinal: Negative for abdominal pain, diarrhea and vomiting.  Genitourinary: Negative for dysuria and urgency.  Musculoskeletal: Negative for myalgias and neck pain.  Skin: Negative for rash.  Neurological: Negative for dizziness, focal weakness, seizures, weakness and headaches.  Psychiatric/Behavioral: Negative for memory loss. The patient does not have insomnia.     Nutrition:  Tolerating Diet: Tolerating PT:      DRUG ALLERGIES:  No Known Allergies  VITALS:  Blood pressure (!) 157/93, pulse 72, temperature 97.7 F (36.5 C), temperature source Oral, resp. rate 12, height 5\' 3"  (1.6 m), weight 60.3 kg (132 lb 15 oz), last menstrual period 04/03/2017, SpO2 96 %.  PHYSICAL EXAMINATION:   Physical Exam  GENERAL:  46 y.o.-year-old patient lying in the bed with no acute distress.  EYES: Pupils equal, round, reactive to light and accommodation. No scleral icterus. Extraocular muscles intact.  HEENT: Head atraumatic, normocephalic. Oropharynx and nasopharynx clear.  NECK:  Supple, no jugular venous distention. No thyroid enlargement, no tenderness.  LUNGS:  Normal breath sounds bilaterally, no wheezing, rales,rhonchi or crepitation. No use of accessory muscles of respiration.  CARDIOVASCULAR: S1, S2 normal. No murmurs, rubs, or gallops.  ABDOMEN: Soft, nontender, nondistended. Bowel sounds present. No organomegaly or mass.  EXTREMITIES: No pedal edema, cyanosis, or clubbing.  NEUROLOGIC: Cranial nerves II through XII are intact. Muscle strength 5/5 in all extremities. Sensation intact. Gait not checked.  PSYCHIATRIC: The patient is alert and oriented x 3.  SKIN: No obvious rash, lesion, or ulcer.    LABORATORY PANEL:   CBC  Recent Labs Lab 04/15/17 0043  WBC 5.6  HGB 12.0  HCT 36.1  PLT 171   ------------------------------------------------------------------------------------------------------------------  Chemistries   Recent Labs Lab 04/15/17 0752  NA 136  K 3.8  CL 103  CO2 27  GLUCOSE 96  BUN 13  CREATININE 1.00  CALCIUM 9.4  MG 2.0   ------------------------------------------------------------------------------------------------------------------  Cardiac Enzymes  Recent Labs Lab 04/15/17 0752  TROPONINI <0.03   ------------------------------------------------------------------------------------------------------------------  RADIOLOGY:  Dg Chest Portable 1 View  Result Date: 04/14/2017 CLINICAL DATA:  Sharp chest pain, began this morning while watching television, worsened when taking a deep breath, pain radiating down LEFT arm, smoker EXAM: PORTABLE CHEST 1 VIEW COMPARISON:  Portable exam 1645 hours without priors for comparison FINDINGS: External pacing leads project over chest. Normal heart size, mediastinal contours, and pulmonary vascularity. Lungs clear. No pleural effusion or pneumothorax. Bones unremarkable. IMPRESSION: No acute abnormalities. Electronically Signed   By: Lavonia Dana M.D.   On: 04/14/2017 16:54   Ct Angio Chest Aorta W And/or Wo Contrast  Result Date: 04/14/2017 CLINICAL DATA:  Onset  of severe chest  pain this morning while watching television worse with taking a deep breath and radiating down into LEFT arm, severely hypertensive, smoker EXAM: CT ANGIOGRAPHY CHEST WITH CONTRAST TECHNIQUE: Multidetector CT imaging of the chest was performed using the standard protocol during bolus administration of intravenous contrast. Multiplanar CT image reconstructions and MIPs were obtained to evaluate the vascular anatomy. CONTRAST:  75 cc Isovue 370 IV COMPARISON:  None FINDINGS: Cardiovascular: On precontrast images, minimal atherosclerotic calcification of aorta is noted. No high attenuation mural crest since seen within aorta to suggest intramural hemorrhage. Aorta normal caliber. Normal aortic enhancement following contrast without aneurysm or dissection. Pulmonary arteries appear patent without gross evidence of pulmonary embolism. Mediastinum/Nodes: Esophagus unremarkable. No thoracic adenopathy. Base of cervical region normal appearance. Lungs/Pleura: Lungs clear. No pulmonary infiltrate, pleural effusion or pneumothorax. Upper Abdomen: Hypervascular focus medial segment LEFT lobe liver 22 x 11 mm nonspecific but potentially an atypical hemangioma. Musculoskeletal: No acute osseous findings. Review of the MIP images confirms the above findings. IMPRESSION: No acute intrathoracic abnormalities. Hypervascular focus in the liver on arterial phase imaging, 22 x 11 mm, nonspecific but potentially could represent an atypical hemangioma ; recommend followup non emergent MR imaging with and without contrast to characterize. This recommendation follows ACR consensus guidelines: Management of Incidental Liver Lesions on CT: A White Paper of the ACR Incidental Findings Committee. J Am Coll Radiol 2017; 35:5732-2025. Aortic Atherosclerosis (ICD10-I70.0). Electronically Signed   By: Lavonia Dana M.D.   On: 04/14/2017 17:52     ASSESSMENT AND PLAN:   Active Problems:   Hypertensive emergency   #1 chest  pain secondary to hypertensive emergency: Started on nitro drip and heparin drip, admitted to ICU. Patient troponins are negative for 3 times. Chest pain and headache are resolved. Blood pressure improved. Discontinue nitro drip, heparin drip, continue oral amlodipine, hydralazine, add small dose beta blockers, possible  discharge  next 24 hours, follow echocardiogram, also to telemetry, cardiology follow-up today, possible discharge tomorrow.   D/w husband,ICU RN  All the records are reviewed and case discussed with Care Management/Social Workerr. Management plans discussed with the patient, family and they are in agreement.  CODE STATUS: full  TOTAL TIME TAKING CARE OF THIS PATIENT: 35 minutes.   POSSIBLE D/C IN 1-2 DAYS, DEPENDING ON CLINICAL CONDITION.   Epifanio Lesches M.D on 04/15/2017 at 9:42 AM  Between 7am to 6pm - Pager - [phone]  After 6pm go to [email or web address] - password [email or web address] [facility]  Office  [phone]  CC: Primary care physician; Patient, No Pcp Per

## 2017-04-15 NOTE — Progress Notes (Signed)
PULMONARY / CRITICAL CARE MEDICINE   Name: Cassandra Tran MRN: [id number] DOB: March 21, 1971    ADMISSION DATE:  04/14/2017   CONSULTATION DATE:  04/14/17  REFERRING MD: Vianne Bulls  REASON: Hypertensive emergency  CHIEF COMPLAINT:  Chest pain  HISTORY OF PRESENT ILLNESS:   This is a 46 year old African-American female with a history of tobacco use disorder and no other past medical history who presented to the ED with complaints of chest pain and shortness of breath. She was found to be severely hypertensive with a blood pressure of 210/129 and a heart rate of 66. She was admitted to the ICU for management of hypertensive emergency. Patient is currently awake and complaining of a headache which she rates as a 10 on 10, no associated symptoms. Reports significant improvement in chest pain and shortness of breath.    SUBJECTIVE: Headache resolved. Blood pressure much controlled. Systolic blood pressure currently less than 865 and diastolic less than 784. Denies chest pain, palpitations, nausea, vomiting, diarrhea, dizziness and headache  REVIEW OF SYSTEMS:   Constitutional: Negative for fever and chills.  HENT: Negative for congestion and rhinorrhea.  Eyes: Negative for redness and visual disturbance.  Respiratory: Negative for shortness of breath and wheezing.  Cardiovascular: Negative for chest pain and palpitations.  Gastrointestinal: Negative  for nausea , vomiting and abdominal pain and  Loose stools Genitourinary: Negative for dysuria and urgency.  Endocrine: Denies polyuria, polyphagia and heat intolerance Musculoskeletal: Negative for myalgias and arthralgias.  Skin: Negative for pallor and wound.  Neurological: Negative for dizziness, headache resolved   VITAL SIGNS: BP (!) 129/91   Pulse 60   Temp 98.3 F (36.8 C) (Oral)   Resp 11   Ht 5\' 3"  (1.6 m)   Wt 132 lb 15 oz (60.3 kg)   LMP 04/03/2017   SpO2 98%   BMI 23.55 kg/m   HEMODYNAMICS:    VENTILATOR SETTINGS:     INTAKE / OUTPUT: I/O last 3 completed shifts: In: 155.5 [I.V.:155.5] Out: 950 [Urine:950]  PHYSICAL EXAMINATION: General: No acute distress Neuro:  Alert and oriented 4, speech is normal, cranial nerves intact, grip strength in upper and lower extremities intact HEENT:  PERRLA, trachea midline, no JVD Cardiovascular:  RRR, S1, S2, no murmur, regurg or gallop Lungs:  Clear to auscultation bilaterally Abdomen: Nondistended, normal bowel sounds in all 4 quadrants, palpation reveals no organomegaly Musculoskeletal: Positive range of motion in upper and lower extremities Skin:  Skin is warm and dry  LABS:  BMET  Recent Labs Lab 04/14/17 1604 04/15/17 0043  NA 137 134*  K 4.2 3.5  CL 103 103  CO2 28 24  BUN 22* 15  CREATININE 1.24* 0.91  GLUCOSE 94 126*    Electrolytes  Recent Labs Lab 04/14/17 1604 04/15/17 0043  CALCIUM 9.7 9.2    CBC  Recent Labs Lab 04/14/17 1604 04/15/17 0043  WBC 5.2 5.6  HGB 13.3 12.0  HCT 39.8 36.1  PLT 188 171    Coag's  Recent Labs Lab 04/14/17 1618  APTT >160*  INR 1.19    Sepsis Markers No results for input(s): LATICACIDVEN, PROCALCITON, O2SATVEN in the last 168 hours.  ABG No results for input(s): PHART, PCO2ART, PO2ART in the last 168 hours.  Liver Enzymes No results for input(s): AST, ALT, ALKPHOS, BILITOT, ALBUMIN in the last 168 hours.  Cardiac Enzymes  Recent Labs Lab 04/14/17 1604 04/14/17 1956 04/15/17 0043  TROPONINI <0.03 <0.03 <0.03    Glucose  Recent Labs Lab 04/14/17 2050  GLUCAP 147*    Imaging Dg Chest Portable 1 View  Result Date: 04/14/2017 CLINICAL DATA:  Sharp chest pain, began this morning while watching television, worsened when taking a deep breath, pain radiating down LEFT arm, smoker EXAM: PORTABLE CHEST 1 VIEW COMPARISON:  Portable exam 1645 hours without priors for comparison FINDINGS: External pacing leads project over chest. Normal heart size, mediastinal contours, and  pulmonary vascularity. Lungs clear. No pleural effusion or pneumothorax. Bones unremarkable. IMPRESSION: No acute abnormalities. Electronically Signed   By: Lavonia Dana M.D.   On: 04/14/2017 16:54   Ct Angio Chest Aorta W And/or Wo Contrast  Result Date: 04/14/2017 CLINICAL DATA:  Onset of severe chest pain this morning while watching television worse with taking a deep breath and radiating down into LEFT arm, severely hypertensive, smoker EXAM: CT ANGIOGRAPHY CHEST WITH CONTRAST TECHNIQUE: Multidetector CT imaging of the chest was performed using the standard protocol during bolus administration of intravenous contrast. Multiplanar CT image reconstructions and MIPs were obtained to evaluate the vascular anatomy. CONTRAST:  75 cc Isovue 370 IV COMPARISON:  None FINDINGS: Cardiovascular: On precontrast images, minimal atherosclerotic calcification of aorta is noted. No high attenuation mural crest since seen within aorta to suggest intramural hemorrhage. Aorta normal caliber. Normal aortic enhancement following contrast without aneurysm or dissection. Pulmonary arteries appear patent without gross evidence of pulmonary embolism. Mediastinum/Nodes: Esophagus unremarkable. No thoracic adenopathy. Base of cervical region normal appearance. Lungs/Pleura: Lungs clear. No pulmonary infiltrate, pleural effusion or pneumothorax. Upper Abdomen: Hypervascular focus medial segment LEFT lobe liver 22 x 11 mm nonspecific but potentially an atypical hemangioma. Musculoskeletal: No acute osseous findings. Review of the MIP images confirms the above findings. IMPRESSION: No acute intrathoracic abnormalities. Hypervascular focus in the liver on arterial phase imaging, 22 x 11 mm, nonspecific but potentially could represent an atypical hemangioma ; recommend followup non emergent MR imaging with and without contrast to characterize. This recommendation follows ACR consensus guidelines: Management of Incidental Liver Lesions on CT:  A White Paper of the ACR Incidental Findings Committee. J Am Coll Radiol 2017; 58:8502-7741. Aortic Atherosclerosis (ICD10-I70.0). Electronically Signed   By: Lavonia Dana M.D.   On: 04/14/2017 17:52     STUDIES:  2-D echo  CULTURES: None  ANTIBIOTICS: None  SIGNIFICANT EVENTS: 04/14/2017: ED with hypertensive crisis  LINES/TUBES: Peripheral IVs  DISCUSSION: 46 year old African-American female with an extensive family history of hypertension and sudden cardiac death presenting with new onset hypertension, hypertensive emergency, chest pain, and acute dyspnea. Lipid panel normal  ASSESSMENT  Acute dyspnea.-No signs of volume overload Hypertensive emergency Chest pain-resolved PLAN Continue hydralazine 10 mg by mouth every 8 hours, hold for systolic blood pressure less than 120. Continue Hydralazine 10 mg IV when necessary for systolic blood pressure greater than 170. Continue Norvasc 10 mg by mouth daily Follow-up 2-D echo Serial cardiac enzymes and EKG Smoking cessation and lifestyle modification advice given Follow up hemoglobin A1c. Discontinue statin as lipid panel is normal Hemodynamic monitoring per ICU protocol If blood pressure improved; Patient will be transferred out of the ICU in the morning Patient is stable for transfer out of the ICU  FAMILY  - Updates: Husband at bedside. Plan of care reviewed with patient and husband  - Inter-disciplinary family meet or Palliative Care meeting due by:  day 7   [facility] ANP-BC Pulmonary and [place] Pager [phone] or [phone]  04/15/2017, 7:22 AM   BP is well controlled on oral meds. Will continue  metoprolol, amlodipine and PRN hydralazine. Transfer to med-surg. After transfer, PCCM will sign off. Please call if we can be of further assistance   Merton Border, MD PCCM service Mobile [phone] Pager [phone] 04/15/2017 10:29 AM

## 2017-04-15 NOTE — Consult Note (Signed)
Cardiology Consult    Patient ID: Cassandra Tran MRN: [id number], DOB/AGE: Feb 13, 1971   Admit date: 04/14/2017 Date of Consult: 04/15/2017  Primary Physician: Patient, No Pcp Per Primary Cardiologist: New Requesting Provider: Governor Specking MD  Patient Profile    Cassandra Tran is a 46 y.o. female with a history of Presenting with hypertensive emergency, get about left-sided chest pain and dyspnea. She is being seen today for the evaluation of these symptoms at the request of Dr. Vianne Bulls.  Past Medical History   -Her only previous diagnosis was hypertension that was noted during a breast surgery several years ago. Was not diagnosed after. History reviewed. No pertinent past medical history.  History reviewed. No pertinent surgical history.   Allergies  No Known Allergies  History of Present Illness    Cassandra Tran is a 46 year old woman with no listed past medical history who was admitted on July 13 of roughly 634 left sided chest pain and dyspnea. In the emergency room she was noted to be hypertensive with blood pressure 210/129 mmHg.   She does have a family history of premature CAD with her mother having an MI at age 71 and died as a result. Her EKG on arrival was concerning for possible anterior ST elevations, Dr. Fletcher Anon did not lose persistent with STEMI. She was started on nitroglycerin drip as well as heparin and admitted to the ICU for likely hypertensive emergency and possible ACS. She is ruled out for MI now with 3 negative troponin levels. She had mild renal insufficiency upon arrival and is now resolved.  Nitro drip discontinued. No more chest pain or headache. No dyspnea. Blood pressure notably improved. She was started on amlodipine 5 mg as well as metoprolol 25 mg twice a day.  Cardiovascular ROS: positive for - chest pain, dyspnea on exertion and Chest discomfort with deep inspiration. Difficult to take a deep breath. negative for - edema, irregular heartbeat, loss of  consciousness, orthopnea, palpitations, paroxysmal nocturnal dyspnea, rapid heart rate, shortness of breath or No symptoms until presentation.   Inpatient Medications    . amLODipine  5 mg Oral Daily  . aspirin EC  81 mg Oral Daily  . docusate sodium  100 mg Oral BID  . metoprolol tartrate  25 mg Oral BID    Family History    Family History  Problem Relation Age of Onset  . Sudden Cardiac Death Mother 18       at age 54  . Stroke Father 64    Social History    Social History   Social History  . Marital status: Married    Spouse name: N/A  . Number of children: N/A  . Years of education: N/A   Occupational History  . Not on file.   Social History Main Topics  . Smoking status: Current Some Day Smoker  . Smokeless tobacco: Never Used  . Alcohol use No  . Drug use: Yes    Types: Marijuana  . Sexual activity: Not on file   Other Topics Concern  . Not on file   Social History Narrative  . No narrative on file     Review of Systems    General:  No chills, fever, night sweats or weight changes.  Cardiovascular:  ++ chest pain & Dyspnea = worse with exertion & deep inspiration.  No edema, orthopnea, palpitations, paroxysmal nocturnal dyspnea. Dermatological: No rash, lesions/masses Respiratory: No cough, dyspnea Urologic: No hematuria, dysuria Abdominal:   No nausea, vomiting, diarrhea, bright  red blood per rectum, melena, or hematemesis Neurologic:  No visual changes, wkns, changes in mental status. ** ? HA All other systems reviewed and are otherwise negative except as noted above.  Physical Exam    Blood pressure 136/89, pulse 70, temperature 97.8 F (36.6 C), resp. rate 14, height 5\' 3"  (1.6 m), weight 132 lb 15 oz (60.3 kg), last menstrual period 04/03/2017, SpO2 100 %.  General: Pleasant, NAD Psych: Normal affect. Neuro: Alert and oriented X 3. Moves all extremities spontaneously. CN II-XII grossly intact HEENT: [place]/AT, EOMI.  OP clear  Neck: Supple  without bruits or JVD. Lungs:  Resp regular and unlabored, CTAB Heart: RRR. Normal S1 and S2. No M/R/G. Nondisplaced PMI.  Abdomen: Soft, non-tender, non-distended, BS + x 4.  Extremities: No clubbing, cyanosis or edema. DP/PT/Radials 2+ and equal bilaterally.  Labs    Troponin (Point of Care Test) No results for input(s): TROPIPOC in the last 72 hours.  Recent Labs  04/14/17 1604 04/14/17 1956 04/15/17 0043 04/15/17 0752  TROPONINI <0.03 <0.03 <0.03 <0.03   Lab Results  Component Value Date   WBC 5.6 04/15/2017   HGB 12.0 04/15/2017   HCT 36.1 04/15/2017   MCV 87.1 04/15/2017   PLT 171 04/15/2017     Recent Labs Lab 04/15/17 0752  NA 136  K 3.8  CL 103  CO2 27  BUN 13  CREATININE 1.00  CALCIUM 9.4  GLUCOSE 96   Lab Results  Component Value Date   CHOL 116 04/15/2017   HDL 54 04/15/2017   LDLCALC 45 04/15/2017   TRIG 84 04/15/2017   No results found for: [facility]   Radiology Studies    Dg Chest Portable 1 View  Result Date: 04/14/2017 CLINICAL DATA:  Sharp chest pain, began this morning while watching television, worsened when taking a deep breath, pain radiating down LEFT arm, smoker EXAM: PORTABLE CHEST 1 VIEW COMPARISON:  Portable exam 1645 hours without priors for comparison FINDINGS: External pacing leads project over chest. Normal heart size, mediastinal contours, and pulmonary vascularity. Lungs clear. No pleural effusion or pneumothorax. Bones unremarkable. IMPRESSION: No acute abnormalities. Electronically Signed   By: Lavonia Dana M.D.   On: 04/14/2017 16:54   Ct Angio Chest Aorta W And/or Wo Contrast  Result Date: 04/14/2017 CLINICAL DATA:  Onset of severe chest pain this morning while watching television worse with taking a deep breath and radiating down into LEFT arm, severely hypertensive, smoker EXAM: CT ANGIOGRAPHY CHEST WITH CONTRAST TECHNIQUE: Multidetector CT imaging of the chest was performed using the standard protocol during bolus  administration of intravenous contrast. Multiplanar CT image reconstructions and MIPs were obtained to evaluate the vascular anatomy. CONTRAST:  75 cc Isovue 370 IV COMPARISON:  None FINDINGS: Cardiovascular: On precontrast images, minimal atherosclerotic calcification of aorta is noted. No high attenuation mural crest since seen within aorta to suggest intramural hemorrhage. Aorta normal caliber. Normal aortic enhancement following contrast without aneurysm or dissection. Pulmonary arteries appear patent without gross evidence of pulmonary embolism. Mediastinum/Nodes: Esophagus unremarkable. No thoracic adenopathy. Base of cervical region normal appearance. Lungs/Pleura: Lungs clear. No pulmonary infiltrate, pleural effusion or pneumothorax. Upper Abdomen: Hypervascular focus medial segment LEFT lobe liver 22 x 11 mm nonspecific but potentially an atypical hemangioma. Musculoskeletal: No acute osseous findings. Review of the MIP images confirms the above findings. IMPRESSION: No acute intrathoracic abnormalities. Hypervascular focus in the liver on arterial phase imaging, 22 x 11 mm, nonspecific but potentially could represent an atypical hemangioma ; recommend  followup non emergent MR imaging with and without contrast to characterize. This recommendation follows ACR consensus guidelines: Management of Incidental Liver Lesions on CT: A White Paper of the ACR Incidental Findings Committee. J Am Coll Radiol 2017; 99:2426-8341. Aortic Atherosclerosis (ICD10-I70.0). Electronically Signed   By: Lavonia Dana M.D.   On: 04/14/2017 17:52    ECG & Cardiac Imaging    EKG #1: Sinus rhythm. Rate 60 BPM. This 93 (rightward), nonspecific ST and T-wave abnormalities abnormalities with subtle ST changes in V2 and V3 (not consistent with STEMI). RSR prime in precordial leads suggestive of either incomplete RBBB or delayed RV repolarization.  EKG #2: Sinus rhythm with biatrial enlargement. Possible RVH. Rate 87 bpm -  nonischemic  Echo Pending:  Assessment & Plan    Principal Problem:   Hypertensive emergency  I suspect that the patient's chest tightness and dyspnea was related to hypertensive emergency/accelerated hypertension. She is ruled out for MI. Unfortunately echocardiogram is pending so we cannot determine any potential hypertensive heart disease. - No suggestion of significant coronary atherosclerosis or calcification on CT scan.  Blood pressure is notably improved on amlodipine. With a concern of potential hypertensive heart disease, would recommend converting from metoprolol tartrate to carvedilol at a dose of 6.25 twice a day.  In the absence continued chest pain and negative cardiac enzymes, would consider inpatient ischemic evaluation if the echocardiogram is abnormal. However, if it is normal, we can consider an outpatient Myoview Stress Test if she is otherwise ready for discharge.  We will also order renal artery Dopplers to evaluate for renal artery stenosis.   Signed, Leonie Man, M.D., M.S.  [employer]  [address] [place], [place] [address] [phone] Fax [phone] 04/15/2017, 5:37 PM

## 2017-04-15 NOTE — Progress Notes (Signed)
ANTICOAGULATION CONSULT NOTE - Follow up [place] for heparin drip dosing and monitoring  Indication: chest pain/ACS  No Known Allergies  Patient Measurements: Height: 5\' 3"  (160 cm) Weight: 132 lb 15 oz (60.3 kg) IBW/kg (Calculated) : 52.4  Vital Signs: Temp: 97.7 F (36.5 C) (07/14 0800) Temp Source: Oral (07/14 0800) BP: 157/93 (07/14 0800) Pulse Rate: 72 (07/14 0800)  Labs:  Recent Labs  04/14/17 1604 04/14/17 1618 04/14/17 1956 04/15/17 0043 04/15/17 0752  HGB 13.3  --   --  12.0  --   HCT 39.8  --   --  36.1  --   PLT 188  --   --  171  --   APTT  --  >160*  --   --   --   LABPROT  --  15.2  --   --   --   INR  --  1.19  --   --   --   HEPARINUNFRC  --   --   --  0.23* 0.31  CREATININE 1.24*  --   --  0.91 1.00  TROPONINI <0.03  --  <0.03 <0.03 <0.03    Estimated Creatinine Clearance: 57.5 mL/min (by C-G formula based on SCr of 1 mg/dL).   Medical History: History reviewed. No pertinent past medical history.   Assessment: 46 yo female with sharp chest pain radiating to left arm. Pharmacy consulted for heparin drip dosing and monitoring for ACS.   Goal of Therapy:  Heparin level 0.3-0.7 units/ml Monitor platelets by anticoagulation protocol: Yes   Plan:  4000 units bolus x 1 already given  Ordered baseline labs Start heparin infusion at 650 units/hr Check anti-Xa level in 6 hours and daily while on heparin Continue to monitor H&H and platelets  7/13 0100 heparin level 0.23. 800 unit bolus and increase rate to 750 units/hr. Recheck in 6 hours.  07/14 ~ 08:00   HL = 0.31.  Continue current drip rate.  Will recheck HL in 6 hours at 14:00.  Olivia Canter [facility] Clinical Pharmacist 04/15/2017 8:33 AM

## 2017-04-15 NOTE — Progress Notes (Signed)
*  PRELIMINARY RESULTS* Echocardiogram 2D Echocardiogram has been performed.  Cassandra Tran 04/15/2017, 3:38 PM

## 2017-04-15 NOTE — ED Notes (Signed)
Date and time results received: 04/14/2017 1715  Test: aPTT Critical Value: >160  Name of Provider Notified: Alfred Levins, MD  Orders Received? Or Actions Taken?: None

## 2017-04-15 NOTE — Care Management Note (Signed)
Case Management Note  Patient Details  Name: [name] MRN: [id number] Date of Birth: [birth year]  Subjective/Objective:     46yo Mrs Cassandra Tran was admitted with c/o chest pain and HTN. She has recently relocated here from [place] to reside with her husband in [place], [place]. Husband's cell phone is [phone] and he provides transportation. Ms Aundria Mems  Has no insurance, no PCP, no home equipment and no home oxygen. Ms Corrigan pharmacy of choice is [facility] [address], [place], [place]. Discussed discharge planning with Mrs Aundria Mems and husband who chose [facility]. Anticipate discharge home with [facility] program medication assistance. We discussed local clinics which serve the uninsured, and the importance of establishing herself with a local PCP so that she can obtain medical care and medication refills. Case management will follow for discharge planning.             Action/Plan:   Expected Discharge Date:    04/16/17              Expected Discharge Plan:     In-House Referral:     Discharge planning Services     Post Acute Care Choice:   Yes Choice offered to:   Patient and husband  DME Arranged:    DME Agency:     HH Arranged:    [place] Agency:     Status of Service:     If discussed at [employer] of [employer], dates discussed:    Additional Comments:  Serenitee Fuertes A, RN 04/15/2017, 3:29 PM

## 2017-04-15 NOTE — Clinical Social Work Note (Signed)
CSW received consult for "no insurance." CSW will assess when able.  Santiago Bumpers, MSW, Latanya Presser [phone]

## 2017-04-16 LAB — BASIC METABOLIC PANEL
Anion gap: 6 (ref 5–15)
BUN: 14 mg/dL (ref 6–20)
CHLORIDE: 105 mmol/L (ref 101–111)
CO2: 26 mmol/L (ref 22–32)
Calcium: 9.3 mg/dL (ref 8.9–10.3)
Creatinine, Ser: 1.08 mg/dL — ABNORMAL HIGH (ref 0.44–1.00)
GFR calc Af Amer: >60 mL/min (ref >60)
GFR calc non Af Amer: >60 mL/min (ref >60)
GLUCOSE: 95 mg/dL (ref 65–99)
POTASSIUM: 4.5 mmol/L (ref 3.5–5.1)
Sodium: 137 mmol/L (ref 135–145)

## 2017-04-16 LAB — GLUCOSE, CAPILLARY: Glucose-Capillary: 99 mg/dL (ref 65–99)

## 2017-04-16 MED ORDER — CARVEDILOL 3.125 MG PO TABS: 3.1250 mg | ORAL_TABLET | Freq: Two times a day (BID) | ORAL | 1 refills | Status: DC

## 2017-04-16 MED ORDER — AMLODIPINE BESYLATE 5 MG PO TABS: 5.0000 mg | ORAL_TABLET | Freq: Every day | ORAL | 0 refills | Status: DC

## 2017-04-16 MED ORDER — ASPIRIN 81 MG PO TBEC: 81.0000 mg | DELAYED_RELEASE_TABLET | Freq: Every day | ORAL | 0 refills | Status: DC

## 2017-04-16 MED ORDER — CARVEDILOL 6.25 MG PO TABS: 6.2500 mg | ORAL_TABLET | Freq: Two times a day (BID) | ORAL | 1 refills | Status: DC

## 2017-04-16 NOTE — Care Management Note (Signed)
Case Management Note  Patient Details  Name: Cassandra Tran MRN: [id number] Date of Birth: October 13, 1970  Subjective/Objective:    Provided Cassandra Tran with [name] assistance form. Discussed with her yesterday.                Action/Plan:   Expected Discharge Date:  04/16/17               Expected Discharge Plan:   04/16/17  In-House Referral:     Discharge planning Services     Post Acute Care Choice:   Yes Choice offered to:   Patient  DME Arranged:    DME Agency:     HH Arranged:    [place] Agency:     Status of Service:   Completed  If discussed at [employer] of [facility] Meetings, dates discussed:    Additional Comments:  Cassandra Mcgath A, RN 04/16/2017, 8:25 AM

## 2017-04-16 NOTE — Discharge Summary (Signed)
Cassandra Tran, is a 46 y.o. female  DOB 1970/10/10  MRN [id number].  Admission date:  04/14/2017  Admitting Physician  Epifanio Lesches, MD  Discharge Date:  04/16/2017   Primary MD  Patient, No Pcp Per  Recommendations for primary care physician for things to follow:   Follow-up with [facility] cardiology Dr. Glenetta Hew in 2 weeks for outpatient stress test   Admission Diagnosis  Unstable angina ([place]) [I20.0] Hypertensive emergency [I16.1]   Discharge Diagnosis  Unstable angina ([place]) [I20.0] Hypertensive emergency [I16.1]    Principal Problem:   Hypertensive emergency      History reviewed. No pertinent past medical history.  History reviewed. No pertinent surgical history.     History of present illness and  Hospital Course:     Kindly see H&P for history of present illness and admission details, please review complete Labs, Consult reports and Test reports for all details in brief  HPI  from the history and physical done on the day of admission 46 year old female without any past medical history came in because of left-sided chest pain, shortness of breath, found to have severely elevated blood pressure 210/120 in the emergency room. Please  Look at history and physical for full details. Initial EKG concerning for ST elevation in detail leads, code STEMI was called but cardiology felt it is not convincing for MI.   Hospital Course  Left side chest pain, shortness of breath secondary to hypertensive emergency; admitted to intensive care unit, started on nitro drip, heparin drip, continued on aspirin, high intensity statins, beta blockers. Fasting lipids are stable, LDL is less than 60. Patient blood. Improved with nitro drip. Started on Norvasc, metoprolol 25 MG twice a day blood pressures nicely improved,  today 124/70. Seen by cardiology Glenetta Hew, patient did have echocardiogram. Echocardiogram showed EF more than 60% with normal wall motion. Patient will be discharged home today with Norvasc 5 MG daily, Coreg 6.25 mg by mouth twice a day as per cardiology recommendation. Patient will follow with the day with Dr. Glenetta Hew  [facility] cardiology for outpatient stress test, renal ultrasound.     Discharge Condition: Stable   Follow UP      Discharge Instructions  and  Discharge Medications   Low sodium diet.   Allergies as of 04/16/2017   No Known Allergies     Medication List    TAKE these medications   amLODipine 5 MG tablet Commonly known as:  NORVASC Take 1 tablet (5 mg total) by mouth daily.   aspirin 81 MG EC tablet Take 1 tablet (81 mg total) by mouth daily.   carvedilol 6.25 MG tablet Commonly known as:  COREG Take 1 tablet (6.25 mg total) by mouth 2 (two) times daily.         Diet and Activity recommendation: See Discharge Instructions above   Consults obtained -cardiology, pulmonary and critical care   Major procedures and Radiology Reports - PLEASE review detailed and final reports for all details, in brief -      Dg Chest Portable 1 View  Result Date: 04/14/2017 CLINICAL DATA:  Sharp chest pain, began this morning while watching television, worsened when taking a deep breath, pain radiating down LEFT arm, smoker EXAM: PORTABLE CHEST 1 VIEW COMPARISON:  Portable exam 1645 hours without priors for comparison FINDINGS: External pacing leads project over chest. Normal heart size, mediastinal contours, and pulmonary vascularity. Lungs clear. No pleural effusion or pneumothorax. Bones unremarkable. IMPRESSION: No acute abnormalities.  Electronically Signed   By: Lavonia Dana M.D.   On: 04/14/2017 16:54   Ct Angio Chest Aorta W And/or Wo Contrast  Result Date: 04/14/2017 CLINICAL DATA:  Onset of severe chest pain this morning while watching  television worse with taking a deep breath and radiating down into LEFT arm, severely hypertensive, smoker EXAM: CT ANGIOGRAPHY CHEST WITH CONTRAST TECHNIQUE: Multidetector CT imaging of the chest was performed using the standard protocol during bolus administration of intravenous contrast. Multiplanar CT image reconstructions and MIPs were obtained to evaluate the vascular anatomy. CONTRAST:  75 cc Isovue 370 IV COMPARISON:  None FINDINGS: Cardiovascular: On precontrast images, minimal atherosclerotic calcification of aorta is noted. No high attenuation mural crest since seen within aorta to suggest intramural hemorrhage. Aorta normal caliber. Normal aortic enhancement following contrast without aneurysm or dissection. Pulmonary arteries appear patent without gross evidence of pulmonary embolism. Mediastinum/Nodes: Esophagus unremarkable. No thoracic adenopathy. Base of cervical region normal appearance. Lungs/Pleura: Lungs clear. No pulmonary infiltrate, pleural effusion or pneumothorax. Upper Abdomen: Hypervascular focus medial segment LEFT lobe liver 22 x 11 mm nonspecific but potentially an atypical hemangioma. Musculoskeletal: No acute osseous findings. Review of the MIP images confirms the above findings. IMPRESSION: No acute intrathoracic abnormalities. Hypervascular focus in the liver on arterial phase imaging, 22 x 11 mm, nonspecific but potentially could represent an atypical hemangioma ; recommend followup non emergent MR imaging with and without contrast to characterize. This recommendation follows ACR consensus guidelines: Management of Incidental Liver Lesions on CT: A White Paper of the ACR Incidental Findings Committee. J Am Coll Radiol 2017; 61:9509-3267. Aortic Atherosclerosis (ICD10-I70.0). Electronically Signed   By: Lavonia Dana M.D.   On: 04/14/2017 17:52    Micro Results    Recent Results (from the past 240 hour(s))  MRSA PCR Screening     Status: None   Collection Time: 04/14/17  9:23  PM  Result Value Ref Range Status   MRSA by PCR NEGATIVE NEGATIVE Final    Comment:        The GeneXpert MRSA Assay (FDA approved for NASAL specimens only), is one component of a comprehensive MRSA colonization surveillance program. It is not intended to diagnose MRSA infection nor to guide or monitor treatment for MRSA infections.        Today   Subjective:   Cassandra Tran today has no headache,no chest abdominal pain,no new weakness tingling or numbness, feels much better wants to go home today.   Objective:   Blood pressure 128/78, pulse 75, temperature 98.3 F (36.8 C), resp. rate 16, height 5\' 3"  (1.6 m), weight 60.4 kg (133 lb 3.2 oz), last menstrual period 04/03/2017, SpO2 99 %.   Intake/Output Summary (Last 24 hours) at 04/16/17 0808 Last data filed at 04/16/17 0403  Gross per 24 hour  Intake              240 ml  Output              500 ml  Net             -260 ml    Exam Awake Alert, Oriented x 3, No new F.N deficits, Normal affect [place].AT,PERRAL Supple Neck,No JVD, No cervical lymphadenopathy appriciated.  Symmetrical Chest wall movement, Good air movement bilaterally, CTAB RRR,No Gallops,Rubs or new Murmurs, No Parasternal Heave +ve B.Sounds, Abd Soft, Non tender, No organomegaly appriciated, No rebound -guarding or rigidity. No Cyanosis, Clubbing or edema, No new Rash or bruise  Data Review  CBC w Diff: Lab Results  Component Value Date   WBC 5.6 04/15/2017   HGB 12.0 04/15/2017   HCT 36.1 04/15/2017   PLT 171 04/15/2017    CMP: Lab Results  Component Value Date   NA 137 04/16/2017   K 4.5 04/16/2017   CL 105 04/16/2017   CO2 26 04/16/2017   BUN 14 04/16/2017   CREATININE 1.08 (H) 04/16/2017  .   Total Time in preparing paper work, data evaluation and todays exam - 3 minutes  Kristan Votta M.D on 04/16/2017 at 8:08 AM    Note: This dictation was prepared with Dragon dictation along with smaller phrase technology. Any  transcriptional errors that result from this process are unintentional.

## 2017-04-16 NOTE — Progress Notes (Signed)
Pt to be discharged today. Iv and tele removed. disch instructions and prescrips given to her. Discharged via w.c. Accompanied by husband.

## 2017-04-17 LAB — HIV ANTIBODY (ROUTINE TESTING W REFLEX): HIV SCREEN 4TH GENERATION: NONREACTIVE

## 2017-04-18 LAB — HEMOGLOBIN A1C
Hgb A1c MFr Bld: 5.3 % (ref 4.8–5.6)
Mean Plasma Glucose: 105 mg/dL

## 2017-04-25 ENCOUNTER — Ambulatory Visit: Payer: Self-pay | Admitting: Internal Medicine

## 2017-06-15 ENCOUNTER — Ambulatory Visit: Payer: Self-pay | Admitting: Cardiovascular Disease

## 2017-06-21 ENCOUNTER — Ambulatory Visit: Payer: Self-pay | Attending: Oncology

## 2017-10-18 ENCOUNTER — Ambulatory Visit: Payer: BC Managed Care – PPO | Admitting: Family Medicine

## 2017-10-18 ENCOUNTER — Encounter: Payer: Self-pay | Admitting: Family Medicine

## 2017-10-18 VITALS — BP 127/87 | HR 79 | Temp 97.9°F | Ht 65.5 in | Wt 134.0 lb

## 2017-10-18 DIAGNOSIS — Z23 Encounter for immunization: Secondary | ICD-10-CM | POA: Diagnosis not present

## 2017-10-18 DIAGNOSIS — I10 Essential (primary) hypertension: Secondary | ICD-10-CM

## 2017-10-18 DIAGNOSIS — Z87898 Personal history of other specified conditions: Secondary | ICD-10-CM

## 2017-10-18 DIAGNOSIS — Z7689 Persons encountering health services in other specified circumstances: Secondary | ICD-10-CM | POA: Diagnosis not present

## 2017-10-18 MED ORDER — AMLODIPINE BESYLATE 5 MG PO TABS: 5.0000 mg | ORAL_TABLET | Freq: Every day | ORAL | 2 refills | Status: DC

## 2017-10-18 NOTE — Patient Instructions (Addendum)
[place] [id number]     Tdap Vaccine (Tetanus, Diphtheria and Pertussis): What You Need to Know 1. Why get vaccinated? Tetanus, diphtheria and pertussis are very serious diseases. Tdap vaccine can protect [place] from these diseases. And, Tdap vaccine given to pregnant women can protect newborn babies against pertussis. TETANUS (Lockjaw) is rare in the [place] today. It causes painful muscle tightening and stiffness, usually all over the body.  It can lead to tightening of muscles in the head and neck so you can't open your mouth, swallow, or sometimes even breathe. Tetanus kills about 1 out of 10 people who are infected even after receiving the best medical care.  DIPHTHERIA is also rare in the [place] today. It can cause a thick coating to form in the back of the throat.  It can lead to breathing problems, heart failure, paralysis, and death.  PERTUSSIS (Whooping Cough) causes severe coughing spells, which can cause difficulty breathing, vomiting and disturbed sleep.  It can also lead to weight loss, incontinence, and rib fractures. Up to 2 in 100 adolescents and 5 in 100 adults with pertussis are hospitalized or have complications, which could include pneumonia or death.  These diseases are caused by bacteria. Diphtheria and pertussis are spread from person to person through secretions from coughing or sneezing. Tetanus enters the body through cuts, scratches, or wounds. Before vaccines, as many as 200,000 cases of diphtheria, 200,000 cases of pertussis, and hundreds of cases of tetanus, were reported in the [place] each year. Since vaccination began, reports of cases for tetanus and diphtheria have dropped by about 99% and for pertussis by about 80%. 2. Tdap vaccine Tdap vaccine can protect adolescents and adults from tetanus, diphtheria, and pertussis. One dose of Tdap is routinely given at age 13 or 46. People who did not get Tdap at that age should get it  as soon as possible. Tdap is especially important for healthcare professionals and anyone having close contact with a baby younger than 12 months. Pregnant women should get a dose of Tdap during every pregnancy, to protect the newborn from pertussis. Infants are most at risk for severe, life-threatening complications from pertussis. Another vaccine, called Td, protects against tetanus and diphtheria, but not pertussis. A Td booster should be given every 10 years. Tdap may be given as one of these boosters if you have never gotten Tdap before. Tdap may also be given after a severe cut or burn to prevent tetanus infection. Your doctor or the person giving you the vaccine can give you more information. Tdap may safely be given at the same time as other vaccines. 3. Some people should not get this vaccine  A person who has ever had a life-threatening allergic reaction after a previous dose of any diphtheria, tetanus or pertussis containing vaccine, OR has a severe allergy to any part of this vaccine, should not get Tdap vaccine. Tell the person giving the vaccine about any severe allergies.  Anyone who had coma or long repeated seizures within 7 days after a childhood dose of DTP or DTaP, or a previous dose of Tdap, should not get Tdap, unless a cause other than the vaccine was found. They can still get Td.  Talk to your doctor if you: ? have seizures or another nervous system problem, ? had severe pain or swelling after any vaccine containing diphtheria, tetanus or pertussis, ? ever had a condition called Guillain-Barr Syndrome (GBS), ? aren't feeling well on the day the  shot is scheduled. 4. Risks With any medicine, including vaccines, there is a chance of side effects. These are usually mild and go away on their own. Serious reactions are also possible but are rare. Most people who get Tdap vaccine do not have any problems with it. Mild problems following Tdap: (Did not interfere with  activities)  Pain where the shot was given (about 3 in 4 adolescents or 2 in 3 adults)  Redness or swelling where the shot was given (about 1 person in 5)  Mild fever of at least 100.33F (up to about 1 in 25 adolescents or 1 in 100 adults)  Headache (about 3 or 4 people in 10)  Tiredness (about 1 person in 3 or 4)  Nausea, vomiting, diarrhea, stomach ache (up to 1 in 4 adolescents or 1 in 10 adults)  Chills, sore joints (about 1 person in 10)  Body aches (about 1 person in 3 or 4)  Rash, swollen glands (uncommon)  Moderate problems following Tdap: (Interfered with activities, but did not require medical attention)  Pain where the shot was given (up to 1 in 5 or 6)  Redness or swelling where the shot was given (up to about 1 in 16 adolescents or 1 in 12 adults)  Fever over 102F (about 1 in 100 adolescents or 1 in 250 adults)  Headache (about 1 in 7 adolescents or 1 in 10 adults)  Nausea, vomiting, diarrhea, stomach ache (up to 1 or 3 people in 100)  Swelling of the entire arm where the shot was given (up to about 1 in 500).  Severe problems following Tdap: (Unable to perform usual activities; required medical attention)  Swelling, severe pain, bleeding and redness in the arm where the shot was given (rare).  Problems that could happen after any vaccine:  People sometimes faint after a medical procedure, including vaccination. Sitting or lying down for about 15 minutes can help prevent fainting, and injuries caused by a fall. Tell your doctor if you feel dizzy, or have vision changes or ringing in the ears.  Some people get severe pain in the shoulder and have difficulty moving the arm where a shot was given. This happens very rarely.  Any medication can cause a severe allergic reaction. Such reactions from a vaccine are very rare, estimated at fewer than 1 in a million doses, and would happen within a few minutes to a few hours after the vaccination. As with any  medicine, there is a very remote chance of a vaccine causing a serious injury or death. The safety of vaccines is always being monitored. For more information, visit: http://www.aguilar.org/ 5. What if there is a serious problem? What should I look for? Look for anything that concerns you, such as signs of a severe allergic reaction, very high fever, or unusual behavior. Signs of a severe allergic reaction can include hives, swelling of the face and throat, difficulty breathing, a fast heartbeat, dizziness, and weakness. These would usually start a few minutes to a few hours after the vaccination. What should I do?  If you think it is a severe allergic reaction or other emergency that can't wait, call 9-1-1 or get the person to the nearest hospital. Otherwise, call your doctor.  Afterward, the reaction should be reported to the Vaccine Adverse Event Reporting System (VAERS). Your doctor might file this report, or you can do it yourself through the VAERS web site at www.vaers.SamedayNews.es, or by calling (207)885-5937. ? VAERS does not give medical advice.  6. The National Vaccine Injury Compensation Program The [employer] Vaccine Injury Compensation Program (VICP) is a federal program that was created to compensate people who may have been injured by certain vaccines. Persons who believe they may have been injured by a vaccine can learn about the program and about filing a claim by calling 743-730-4178 or visiting the [place] website at GoldCloset.com.ee. There is a time limit to file a claim for compensation. 7. How can I learn more?  Ask your doctor. He or she can give you the vaccine package insert or suggest other sources of information.  Call your local or state health department.  Contact the Centers for Disease Control and Prevention (CDC): ? Call 312-014-3326 (1-800-CDC-INFO) or ? Visit CDC's website at http://hunter.com/ CDC Tdap Vaccine VIS (11/26/13) This information  is not intended to replace advice given to you by your health care provider. Make sure you discuss any questions you have with your health care provider. Document Released: 03/20/2012 Document Revised: 06/09/2016 Document Reviewed: 06/09/2016 Elsevier Interactive Patient Education  2017 [place]. Influenza (Flu) Vaccine (Inactivated or Recombinant): What You Need to Know 1. Why get vaccinated? Influenza ("flu") is a contagious disease that spreads around the [place] every year, usually between October and May. Flu is caused by influenza viruses, and is spread mainly by coughing, sneezing, and close contact. Anyone can get flu. Flu strikes suddenly and can last several days. Symptoms vary by age, but can include:  fever/chills  sore throat  muscle aches  fatigue  cough  headache  runny or stuffy nose  Flu can also lead to pneumonia and blood infections, and cause diarrhea and seizures in children. If you have a medical condition, such as heart or lung disease, flu can make it worse. Flu is more dangerous for some people. Infants and young children, people 3 years of age and older, pregnant women, and people with certain health conditions or a weakened immune system are at greatest risk. Each year thousands of people in the [place] die from flu, and many more are hospitalized. Flu vaccine can:  keep you from getting flu,  make flu less severe if you do get it, and  keep you from spreading flu to your family and other people. 2. Inactivated and recombinant flu vaccines A dose of flu vaccine is recommended every flu season. Children 6 months through 41 years of age may need two doses during the same flu season. Everyone else needs only one dose each flu season. Some inactivated flu vaccines contain a very small amount of a mercury-based preservative called thimerosal. Studies have not shown thimerosal in vaccines to be harmful, but flu vaccines that do not contain  thimerosal are available. There is no live flu virus in flu shots. They cannot cause the flu. There are many flu viruses, and they are always changing. Each year a new flu vaccine is made to protect against three or four viruses that are likely to cause disease in the upcoming flu season. But even when the vaccine doesn't exactly match these viruses, it may still provide some protection. Flu vaccine cannot prevent:  flu that is caused by a virus not covered by the vaccine, or  illnesses that look like flu but are not.  It takes about 2 weeks for protection to develop after vaccination, and protection lasts through the flu season. 3. Some people should not get this vaccine Tell the person who is giving you the vaccine:  If you have any severe,  life-threatening allergies. If you ever had a life-threatening allergic reaction after a dose of flu vaccine, or have a severe allergy to any part of this vaccine, you may be advised not to get vaccinated. Most, but not all, types of flu vaccine contain a small amount of egg protein.  If you ever had Guillain-Barr Syndrome (also called GBS). Some people with a history of GBS should not get this vaccine. This should be discussed with your doctor.  If you are not feeling well. It is usually okay to get flu vaccine when you have a mild illness, but you might be asked to come back when you feel better.  4. Risks of a vaccine reaction With any medicine, including vaccines, there is a chance of reactions. These are usually mild and go away on their own, but serious reactions are also possible. Most people who get a flu shot do not have any problems with it. Minor problems following a flu shot include:  soreness, redness, or swelling where the shot was given  hoarseness  sore, red or itchy eyes  cough  fever  aches  headache  itching  fatigue  If these problems occur, they usually begin soon after the shot and last 1 or 2 days. More serious  problems following a flu shot can include the following:  There may be a small increased risk of Guillain-Barre Syndrome (GBS) after inactivated flu vaccine. This risk has been estimated at 1 or 2 additional cases per million people vaccinated. This is much lower than the risk of severe complications from flu, which can be prevented by flu vaccine.  Young children who get the flu shot along with pneumococcal vaccine (PCV13) and/or DTaP vaccine at the same time might be slightly more likely to have a seizure caused by fever. Ask your doctor for more information. Tell your doctor if a child who is getting flu vaccine has ever had a seizure.  Problems that could happen after any injected vaccine:  People sometimes faint after a medical procedure, including vaccination. Sitting or lying down for about 15 minutes can help prevent fainting, and injuries caused by a fall. Tell your doctor if you feel dizzy, or have vision changes or ringing in the ears.  Some people get severe pain in the shoulder and have difficulty moving the arm where a shot was given. This happens very rarely.  Any medication can cause a severe allergic reaction. Such reactions from a vaccine are very rare, estimated at about 1 in a million doses, and would happen within a few minutes to a few hours after the vaccination. As with any medicine, there is a very remote chance of a vaccine causing a serious injury or death. The safety of vaccines is always being monitored. For more information, visit: http://www.aguilar.org/ 5. What if there is a serious reaction? What should I look for? Look for anything that concerns you, such as signs of a severe allergic reaction, very high fever, or unusual behavior. Signs of a severe allergic reaction can include hives, swelling of the face and throat, difficulty breathing, a fast heartbeat, dizziness, and weakness. These would start a few minutes to a few hours after the vaccination. What  should I do?  If you think it is a severe allergic reaction or other emergency that can't wait, call 9-1-1 and get the person to the nearest hospital. Otherwise, call your doctor.  Reactions should be reported to the Vaccine Adverse Event Reporting System (VAERS). Your doctor should file  this report, or you can do it yourself through the VAERS web site at www.vaers.SamedayNews.es, or by calling 4076153043. ? VAERS does not give medical advice. 6. The National Vaccine Injury Compensation Program The [employer] Vaccine Injury Compensation Program (VICP) is a federal program that was created to compensate people who may have been injured by certain vaccines. Persons who believe they may have been injured by a vaccine can learn about the program and about filing a claim by calling (807) 306-2535 or visiting the [place] website at GoldCloset.com.ee. There is a time limit to file a claim for compensation. 7. How can I learn more?  Ask your healthcare provider. He or she can give you the vaccine package insert or suggest other sources of information.  Call your local or state health department.  Contact the Centers for Disease Control and Prevention (CDC): ? Call 352-301-2673 (1-800-CDC-INFO) or ? Visit CDC's website at https://gibson.com/ Vaccine Information Statement, Inactivated Influenza Vaccine (05/09/2014) This information is not intended to replace advice given to you by your health care provider. Make sure you discuss any questions you have with your health care provider. Document Released: 07/14/2006 Document Revised: 06/09/2016 Document Reviewed: 06/09/2016 Elsevier Interactive Patient Education  2017 [employer].

## 2017-10-18 NOTE — Progress Notes (Signed)
BP 127/87 (BP Location: Left Arm, Patient Position: Sitting, Cuff Size: Normal)   Pulse 79   Temp 97.9 F (36.6 C) (Oral)   Ht 5' 5.5" (1.664 m)   Wt 134 lb (60.8 kg)   SpO2 100%   BMI 21.96 kg/m    Subjective:    Patient ID: Kendra Harrington, female    DOB: [birth year], 47 y.o.   MRN: [id number]  HPI: Kendra Harrington is a 47 y.o. female  Chief Complaint  Patient presents with  . Establish Care  . Hypertension  . Medication Refill   Pt here today to establish care. Has been out of her lisinopril for a little over a month and just taking the amlodipine. BPs have been WNL when checked at home. Denies side effects, CP, HAs, SOB, dizziness. Wanting to see about staying off lisinopril as she's been doing well off of it. Also been watching what she eats and exercising more.   Last CPE was last June, last mammogram was about 3 years ago. Had a cyst removed in right breast at that time at [facility]. Overdue for repeat imaging. Last pap was WNL last year.   Past Medical History:  Diagnosis Date  . Hypertension    Social History   Socioeconomic History  . Marital status: Married    Spouse name: Not on file  . Number of children: Not on file  . Years of education: Not on file  . Highest education level: Not on file  Social Needs  . Financial resource strain: Not on file  . Food insecurity - worry: Not on file  . Food insecurity - inability: Not on file  . Transportation needs - medical: Not on file  . Transportation needs - non-medical: Not on file  Occupational History  . Not on file  Tobacco Use  . Smoking status: Current Some Day Smoker  . Smokeless tobacco: Never Used  . Tobacco comment: Social  Substance and Sexual Activity  . Alcohol use: Yes    Alcohol/week: 1.8 oz    Types: 3 Glasses of wine per week  . Drug use: No  . Sexual activity: Yes  Other Topics Concern  . Not on file  Social History Narrative  . Not on file    Relevant past medical, surgical,  family and social history reviewed and updated as indicated. Interim medical history since our last visit reviewed. Allergies and medications reviewed and updated.  Review of Systems  Constitutional: Negative.   HENT: Negative.   Respiratory: Negative.   Cardiovascular: Negative.   Genitourinary: Negative.   Musculoskeletal: Negative.   Skin: Negative.   Neurological: Negative.   Psychiatric/Behavioral: Negative.    Per HPI unless specifically indicated above     Objective:    BP 127/87 (BP Location: Left Arm, Patient Position: Sitting, Cuff Size: Normal)   Pulse 79   Temp 97.9 F (36.6 C) (Oral)   Ht 5' 5.5" (1.664 m)   Wt 134 lb (60.8 kg)   SpO2 100%   BMI 21.96 kg/m   Wt Readings from Last 3 Encounters:  10/18/17 134 lb (60.8 kg)    Physical Exam  Constitutional: She is oriented to person, place, and time. She appears well-developed and well-nourished. No distress.  HENT:  Head: Atraumatic.  Eyes: Conjunctivae are normal. Pupils are equal, round, and reactive to light.  Neck: Normal range of motion. Neck supple.  Cardiovascular: Normal rate and normal heart sounds.  Pulmonary/Chest: Effort normal and breath sounds normal. No  respiratory distress.  Musculoskeletal: Normal range of motion.  Neurological: She is alert and oriented to person, place, and time.  Skin: Skin is warm and dry.  Psychiatric: She has a normal mood and affect. Her behavior is normal.  Nursing note and vitals reviewed.  No results found for this or any previous visit.    Assessment & Plan:   Problem List Items Addressed This Visit      Cardiovascular and Mediastinum   Essential hypertension - Primary    BP stable and WNL today. Ok to stay off lisinopril and continue 5 mg amlodipine. Continue home monitoring, DASH diet, exercise. Call with any persistent abnormal readings      Relevant Medications   amLODipine (NORVASC) 5 MG tablet    Other Visit Diagnoses    Encounter to establish  care       Pt requesting to get caught up on vaccines and mammogram today. Will schedule CPE and get records from breast procedure and past imaging   Need for influenza vaccination       Relevant Orders   Flu Vaccine QUAD 6+ mos PF IM (Fluarix Quad PF) (Completed)   Need for Tdap vaccination       Hx of abnormal mammogram       Relevant Orders   MM Digital Diagnostic Bilat      Follow up plan: Return in about 6 months (around 04/17/2018) for CPE, BP.

## 2017-10-20 DIAGNOSIS — I10 Essential (primary) hypertension: Secondary | ICD-10-CM | POA: Insufficient documentation

## 2017-10-20 NOTE — Assessment & Plan Note (Signed)
BP stable and WNL today. Ok to stay off lisinopril and continue 5 mg amlodipine. Continue home monitoring, DASH diet, exercise. Call with any persistent abnormal readings

## 2017-10-23 ENCOUNTER — Ambulatory Visit: Payer: Self-pay | Admitting: Family Medicine

## 2017-10-25 ENCOUNTER — Inpatient Hospital Stay
Admission: RE | Admit: 2017-10-25 | Discharge: 2017-10-25 | Disposition: A | Discharge status: Home / Self Care | Payer: Self-pay | Source: Ambulatory Visit | Attending: *Deleted | Admitting: *Deleted

## 2017-10-25 ENCOUNTER — Other Ambulatory Visit: Payer: Self-pay | Admitting: *Deleted

## 2017-10-25 DIAGNOSIS — Z9289 Personal history of other medical treatment: Secondary | ICD-10-CM

## 2017-10-26 ENCOUNTER — Other Ambulatory Visit: Payer: Self-pay | Admitting: *Deleted

## 2017-10-26 ENCOUNTER — Inpatient Hospital Stay
Admission: RE | Admit: 2017-10-26 | Discharge: 2017-10-26 | Disposition: A | Discharge status: Home / Self Care | Payer: Self-pay | Source: Ambulatory Visit | Attending: *Deleted | Admitting: *Deleted

## 2017-10-26 DIAGNOSIS — Z9289 Personal history of other medical treatment: Secondary | ICD-10-CM

## 2017-10-31 ENCOUNTER — Telehealth: Payer: Self-pay | Admitting: Family Medicine

## 2017-10-31 DIAGNOSIS — R921 Mammographic calcification found on diagnostic imaging of breast: Secondary | ICD-10-CM

## 2017-10-31 DIAGNOSIS — Z1239 Encounter for other screening for malignant neoplasm of breast: Secondary | ICD-10-CM

## 2017-10-31 NOTE — Telephone Encounter (Signed)
-----   Message from Kathaleen Bury, [place] sent at 10/30/2017  9:52 AM EST ----- Patient needs Diagnostic Bilateral Tomo Mammogram(IMG 5535) with Left Breast Ultrasound(IMG 5531) and Right Breast Ultrasound(IMG 5532) for Right Breast Calcifications Follow-up and Yearly.  Thanks!

## 2017-10-31 NOTE — Telephone Encounter (Signed)
Orders placed per recommendations

## 2017-11-10 ENCOUNTER — Ambulatory Visit
Admission: RE | Admit: 2017-11-10 | Discharge: 2017-11-10 | Disposition: A | Discharge status: Home / Self Care | Payer: BC Managed Care – PPO | Source: Ambulatory Visit | Attending: Family Medicine | Admitting: Family Medicine

## 2017-11-10 DIAGNOSIS — R921 Mammographic calcification found on diagnostic imaging of breast: Secondary | ICD-10-CM

## 2017-11-10 DIAGNOSIS — Z1231 Encounter for screening mammogram for malignant neoplasm of breast: Secondary | ICD-10-CM | POA: Diagnosis not present

## 2017-11-10 DIAGNOSIS — Z1239 Encounter for other screening for malignant neoplasm of breast: Secondary | ICD-10-CM

## 2017-11-13 ENCOUNTER — Other Ambulatory Visit: Payer: Self-pay | Admitting: Family Medicine

## 2017-11-13 ENCOUNTER — Telehealth: Payer: Self-pay

## 2017-11-13 DIAGNOSIS — R921 Mammographic calcification found on diagnostic imaging of breast: Secondary | ICD-10-CM

## 2017-11-13 DIAGNOSIS — R928 Other abnormal and inconclusive findings on diagnostic imaging of breast: Secondary | ICD-10-CM

## 2017-11-13 DIAGNOSIS — Z111 Encounter for screening for respiratory tuberculosis: Secondary | ICD-10-CM

## 2017-11-13 NOTE — Telephone Encounter (Signed)
Copied from [place]. Topic: Quick Communication - Lab Results >> Nov 13, 2017 12:25 PM Hewitt Shorts wrote: Pt is looking for mammogram results and speak to someone regarding getting a tb skin test   Best number [phone]

## 2017-11-13 NOTE — Telephone Encounter (Signed)
Order entered for quantiferon gold.   I haven't received anything that I'm aware of other than cosigning new orders that were sent over. I am happy to place a referral if they haven't, but it was my understanding that they usually handled all of that comprehensively. Let me know

## 2017-11-13 NOTE — Telephone Encounter (Signed)
I explained to patient the report wasn't ready and patient stated they handed her the result and stated that you will be sent the report to go over and for the referral to get a biopsy done. I explained to patient that we have received that yet but I would send back to [place].  Please enter in order for quantiferon gold lab. Patient is going to come in and get that done.

## 2017-11-13 NOTE — Telephone Encounter (Signed)
Kendra Harrington,  Could you give patient the results of her mammogram?

## 2017-11-13 NOTE — Telephone Encounter (Signed)
The report isn't available yet, they will reach out to her directly when the report is ready.

## 2017-11-14 ENCOUNTER — Other Ambulatory Visit: Payer: Self-pay | Admitting: Family Medicine

## 2017-11-15 NOTE — Telephone Encounter (Signed)
Kendra Harrington,  We're a little confused, patient stated that we were supposed to give her results and schedule biopsy. She stated that [name] just handed her the report and said talk to your PCP. Apolonio Schneiders never got a report. And I know we usually don't give MM results and everything gets done through you guys.  However I see biopsy procedures scheduled by you with patient this month. Have you spoken with patient about these?

## 2017-11-16 NOTE — Telephone Encounter (Signed)
Dellie Burns, [place], [place]        Delton See,  I got your message regarding Ms. Lovena Le. Unfortunately I don't have access to send messages back.   She is already scheduled, but If you don't mind on this patient, could you please give me a call. I think this case may be a little easier to talk about over the phone.   My direct # is [phone].   Thanks so much.

## 2017-11-16 NOTE — Telephone Encounter (Signed)
See below.   Spoke with [employer]. Patient is already scheduled for a biopsy and Aldona Bar has spoken to patient. Aldona Bar said the report was not handed to her to tell her contact her PCP.  Aldona Bar said that she thinks the patient got confused because she wanted sedation for her biopsy. She explained that they only did lidocaine to numb the area and most patients did not need anything else. Patient would have to contact her PCP if she wanted anything else prescribed. That's where patient must have became confused.   Patient was told that she could come in for the lab draw earlier and patient understood.

## 2017-11-20 ENCOUNTER — Ambulatory Visit
Admission: RE | Admit: 2017-11-20 | Discharge: 2017-11-20 | Disposition: A | Discharge status: Home / Self Care | Payer: BC Managed Care – PPO | Source: Ambulatory Visit | Attending: Family Medicine | Admitting: Family Medicine

## 2017-11-20 DIAGNOSIS — R921 Mammographic calcification found on diagnostic imaging of breast: Secondary | ICD-10-CM

## 2017-11-20 DIAGNOSIS — C801 Malignant (primary) neoplasm, unspecified: Secondary | ICD-10-CM

## 2017-11-20 DIAGNOSIS — N6082 Other benign mammary dysplasias of left breast: Secondary | ICD-10-CM | POA: Diagnosis not present

## 2017-11-20 DIAGNOSIS — R928 Other abnormal and inconclusive findings on diagnostic imaging of breast: Secondary | ICD-10-CM

## 2017-11-20 DIAGNOSIS — N6081 Other benign mammary dysplasias of right breast: Secondary | ICD-10-CM | POA: Insufficient documentation

## 2017-11-20 HISTORY — DX: Malignant (primary) neoplasm, unspecified: C80.1

## 2017-11-20 HISTORY — PX: BREAST BIOPSY: SHX20

## 2017-11-22 ENCOUNTER — Ambulatory Visit (INDEPENDENT_AMBULATORY_CARE_PROVIDER_SITE_OTHER): Payer: BC Managed Care – PPO | Admitting: Unknown Physician Specialty

## 2017-11-22 ENCOUNTER — Ambulatory Visit: Payer: BC Managed Care – PPO | Admitting: Family Medicine

## 2017-11-22 ENCOUNTER — Encounter: Payer: Self-pay | Admitting: Unknown Physician Specialty

## 2017-11-22 ENCOUNTER — Telehealth: Payer: Self-pay | Admitting: Family Medicine

## 2017-11-22 DIAGNOSIS — D0512 Intraductal carcinoma in situ of left breast: Secondary | ICD-10-CM

## 2017-11-22 NOTE — Telephone Encounter (Signed)
Received a call from [place] with [name] patients results are ready and listed under labs-pathology report. They are needing to know if Kendra Harrington needs the radiologist to contact the patient or if she would like to contact the patient with the results and discuss surgical referral.    [place] Go [phone]   Thanks

## 2017-11-22 NOTE — Telephone Encounter (Signed)
Routing to provider to advise.  

## 2017-11-22 NOTE — Progress Notes (Signed)
BP (!) 154/98   Pulse 97   Temp 99.3 F (37.4 C) (Oral)   Wt 137 lb 6.4 oz (62.3 kg)   LMP 11/10/2017   SpO2 98%   BMI 22.52 kg/m    Subjective:    Patient ID: Kendra Harrington, female    DOB: 1971/07/12, 47 y.o.   MRN: [id number]  HPI: Kendra Harrington is a 47 y.o. female  Chief Complaint  Patient presents with  . Follow-up   Pt is here to discuss results of breast biopsy.  Diagnosis of atypical ductal carcinoma of left breast.  Right breast with atypia  Relevant past medical, surgical, family and social history reviewed and updated as indicated. Interim medical history since our last visit reviewed. Allergies and medications reviewed and updated.  Review of Systems  Per HPI unless specifically indicated above     Objective:    BP (!) 154/98   Pulse 97   Temp 99.3 F (37.4 C) (Oral)   Wt 137 lb 6.4 oz (62.3 kg)   LMP 11/10/2017   SpO2 98%   BMI 22.52 kg/m   Wt Readings from Last 3 Encounters:  11/22/17 137 lb 6.4 oz (62.3 kg)  10/18/17 134 lb (60.8 kg)    Physical Exam  Results for orders placed or performed during the hospital encounter of 11/20/17  Surgical pathology  Result Value Ref Range   SURGICAL PATHOLOGY      Surgical Pathology CASE: [id number] PATIENT: Kendra Harrington Surgical Pathology Report     SPECIMEN SUBMITTED: A. Breast, right, posterior UOQ B. Breast, right, anterior UOQ C. Breast, left, lower posterior  CLINICAL HISTORY: Left breast post lower calcs-rule out cancer, right breast #1 post UOQ calcs-rule out cancer, right breast #2 ant UOQ calcs-rule out cancer  PRE-OPERATIVE DIAGNOSIS: None provided  POST-OPERATIVE DIAGNOSIS: None provided.     DIAGNOSIS: A. BREAST, RIGHT POSTERIOR UPPER OUTER QUADRANT; STEREOTACTIC BIOPSY: - FLAT EPITHELIAL ATYPIA. - COLUMNAR CELL CHANGE. - PSEUDO-ANGIOMATOUS STROMAL HYPERPLASIA. - CALCIFICATIONS ASSOCIATED WITH FLAT EPITHELIAL ATYPIA AND COLUMNAR CELL CHANGE.  B. BREAST, RIGHT  ANTERIOR UPPER OUTER QUADRANT; STEREOTACTIC BIOPSY: - ATYPICAL DUCTAL HYPERPLASIA AND FLAT EPITHELIAL ATYPIA. - APOCRINE METAPLASIA. - COLUMNAR CELL CHANGE. - PSEUDO-ANGIOMATOUS STROMAL HYPERPLASIA. - CALCIFICATIONS ASSOCIATED WITH ATY PICAL DUCTAL HYPERPLASIA, FLAT EPITHELIAL ATYPIA, AND COLUMNAR CELL CHANGE.  C. BREAST, LEFT LOWER POSTERIOR; STEREOTACTIC BIOPSY: - DUCTAL CARCINOMA IN SITU, LOW GRADE. - SEE COMMENT. - LOBULAR CARCINOMA IN SITU WITH PAGETOID EXTENSION OF DUCT. - COLUMNAR CELL CHANGE WITH HYPERPLASIA. - PSEUDO-ANGIOMATOUS STROMAL HYPERPLASIA. - APOCRINE METAPLASIA. - CALCIFICATIONS ASSOCIATED WITH DCIS.   Comment: DCIS is present in two blocks in specimen C. The largest focus of DCIS measures 4 mm. ER and PR have been ordered and will be reported in an addendum. These findings were communicated to [facility] in Dr. Aron Baba office on 11/22/2017. Read back procedure was performed.    GROSS DESCRIPTION:  A. The specimen is received in a formalin-filled Brevera collection device labeled with the patient's name and right breast stereo calcs upper outer quadrant posterior #1. Core pieces: Multiple Measurement: 2.0 x 1.0 x 0.3 cm, aggregate Comments: Tan-yellow, fibrofatty tis sue fragments, inked blue. The specimen is initially received in a Brevera collection device with the lid loosely floating in the specimen container. The specimen was re-imaged and placed in a CoreTrainer device. Accompanying specimen radiograph: Yes, CoreTrainer marked at 12, 3, and 9 Entirely submitted in cassette(s): 1-section 12 2-section 3 3-section 9 4-remainder of tissue Time/Date in fixative: Collected  at 8:56 AM and placed in formalin at 8:58 AM on 11/20/2017 Total fixation time: 12 hours  B. The specimen is received in a formalin-filled Brevera collection device labeled with the patient's name and right breast stereo upper outer quadrant posterior #2. Core pieces:  Multiple Measurement: 2.0 x 0.6 x 0.3 cm, aggregate Comments: Tan-yellow fibrofatty tissue fragments, inked black Accompanying specimen radiograph: Yes, marked at A, B Entirely submitted in cassette(s): 1-section A 2-section B 3-remainder of tissue Time/Date in fixative: Collected at 9:32 A M and placed in formalin at 9:35 AM on 11/20/2017 Total fixation time: 11.5 hours  C. The specimen is received in a formalin-filled Brevera collection device labeled with the patient's name and left breast stereo calcs left lower posterior. Core pieces: Multiple Measurement: 4.5 x 2.0 x 0.3 cm, aggregate Comments: Tan-yellow fibrofatty tissue fragments, inked green Accompanying specimen radiograph: Yes, marked at A, E, J Entirely submitted in cassette(s): 1-section A 2-section E 3-section J 4-6 -remainder of tissue Time/Date in fixative: Collected at 8:27 AM and placed in formalin at 8:35 AM on 11/20/2017 Total fixation time: 12.5 hours  Final Diagnosis performed by Quay Burow, MD.  Electronically signed 11/22/2017 9:50:59AM    The electronic signature indicates that the named Attending Pathologist has evaluated the specimen  Technical component performed at [facility], [address], [place], [place] [address] Lab: [phone] Dir: Rush Farmer, MD, MMM   Professional component performed at [facility], [facility], [place], [place], [place] [address] Lab: [phone] Dir: Dellia Nims. Reuel Derby, MD        Assessment & Plan:   Problem List Items Addressed This Visit      Unprioritized   Ductal carcinoma in situ (DCIS) of left breast    Discussed path report to best of my ability.  Atypia in right.  Discussed excellent prognosis.  Further treatment with Dr. Fleet Contras.  Appt made Monday at 4 p          Follow up plan: With Dr Fleet Contras

## 2017-11-22 NOTE — Telephone Encounter (Signed)
Patient is in a meeting until about 4- will come in right after that. Spoke to Dr. Jeananne Rama and Malachy Mood. Either will see her to discuss results.   Referral to Dr. Bary Castilla put in today. Please try to get her appointment scheduled so that she can have it when she leaves her appointment today.    FYI to MAC and CW- this is the patient I was telling you about.   Keri- I've got the referral in. Please try to set up appointment so we can give it to patient at her appointment this afternoon.

## 2017-11-22 NOTE — Telephone Encounter (Signed)
Attempting to set up appointment today to discuss results with patient. If we cannot get her in today, Please let Radiologist know that we would like them to discuss results, but I can get the referral placed if they need me to.

## 2017-11-22 NOTE — Telephone Encounter (Signed)
Patient has an appointment scheduled with Dr. Fleet Contras Monday 11/27/2017 at 4:00PM at [facility].

## 2017-11-22 NOTE — Assessment & Plan Note (Signed)
Discussed path report to best of my ability.  Atypia in right.  Discussed excellent prognosis.  Further treatment with Dr. Fleet Contras.  Appt made Monday at 4 p

## 2017-11-22 NOTE — Patient Instructions (Addendum)
We have scheduled an appointment for you at [facility].           That appointment is scheduled for Monday 11/27/2017 at 4:00PM with Dr. Fleet Contras.                              Address: [address] [place], [place] [address]       Phone: [phone]  Atypical ductal carcinoma in situ   Ductal Carcinoma in Situ Ductal carcinoma in situ is a growth of abnormal cells in the breast. The abnormal cells are located in the tubes that carry milk to the nipple (milk ducts) and have not spread to other areas. Ductal carcinoma in situ is the earliest form of breast cancer. What are the causes? The exact cause of ductal carcinoma in situ is not known. What increases the risk? Risk factors for ductal carcinoma in situ include:  Age. Your risk increases as you get older.  Family history of breast cancer.  Having prior radiation treatments to your breasts or chest area.  Being overweight.  Using or having used hormones, such as estrogen.  Prior history of: ? Breast cancer. ? Noncancerous breast conditions. ? Dense breasts.  Drinking more than one alcoholic beverage per day.  Starting menstruation before the age of 57.  Never having given birth.  Giving birth to your first child when you were over the age of 50.  Not breastfeeding, if you have given birth.  Not exercising consistently.  What are the signs or symptoms? Ductal carcinoma in situ does not cause any symptoms. How is this diagnosed? Ductal carcinoma in situ is usually discovered during a routine X-ray study of the breasts (mammogram). To diagnose the condition, your health care provider will take a tissue sample from your breast so it can be examined under a microscope (breast biopsy). How is this treated? Ductal carcinoma in situ treatment may include:  A lumpectomy. This is the removal of the area of abnormal cells, along with a ring of normal tissue.This may also be called breast-conserving  surgery.  A simple mastectomy. This is the removal of breast tissue, the nipple, and the circle of colored tissue around the nipple (areola). Sometimes, one or more lymph nodes from under the arm are also removed.  Preventative mastectomy. This is the removal of both breasts. This is usually done only if you have a very high risk of developing breast cancer.  Radiation. This is a treatment that uses X-rays to kill cancer cells.  Medicines to keep the cancer from spreading.  Follow these instructions at home:  Take medicines only as directed by your health care provider.  Keep all follow-up visits as directed by your health care provider. This is important.  Limit alcohol intake to no more than 1 drink per day for nonpregnant women. One drink equals 12 ounces of beer, 5 ounces of wine, or 1 ounces of hard liquor. Contact a health care provider if: You have a fever. Get help right away if: You have difficulty breathing. This information is not intended to replace advice given to you by your health care provider. Make sure you discuss any questions you have with your health care provider. Document Released: 04/16/2014 Document Revised: 02/25/2016 Document Reviewed: 02/20/2014 Elsevier Interactive Patient Education  [employer].

## 2017-11-23 ENCOUNTER — Encounter: Payer: Self-pay | Admitting: *Deleted

## 2017-11-23 ENCOUNTER — Other Ambulatory Visit: Payer: Self-pay

## 2017-11-23 LAB — SURGICAL PATHOLOGY

## 2017-11-27 ENCOUNTER — Inpatient Hospital Stay: Payer: Self-pay

## 2017-11-27 ENCOUNTER — Ambulatory Visit (INDEPENDENT_AMBULATORY_CARE_PROVIDER_SITE_OTHER): Payer: BC Managed Care – PPO | Admitting: General Surgery

## 2017-11-27 ENCOUNTER — Encounter: Payer: Self-pay | Admitting: General Surgery

## 2017-11-27 VITALS — BP 142/70 | HR 70 | Ht 65.0 in | Wt 137.0 lb

## 2017-11-27 DIAGNOSIS — N6321 Unspecified lump in the left breast, upper outer quadrant: Secondary | ICD-10-CM

## 2017-11-27 DIAGNOSIS — N6311 Unspecified lump in the right breast, upper outer quadrant: Secondary | ICD-10-CM

## 2017-11-27 DIAGNOSIS — N6312 Unspecified lump in the right breast, upper inner quadrant: Secondary | ICD-10-CM

## 2017-11-27 NOTE — Progress Notes (Signed)
Patient ID: Kendra Harrington, female   DOB: [birth year], 47 y.o.   MRN: [id number]  Chief Complaint  Patient presents with  . Breast Cancer    HPI Kendra Harrington is a 47 y.o. female who presents for a breast evaluation. The most recent mammogram was done on 10/26/2017 and added views 2/8/2018and left breast biopsy on 11/20/2017 Patient does perform regular self breast checks and gets regular mammograms done.   Husband of just under 1 year, Kendra Harrington, is present at visit. Sister , Kendra Harrington is on speaker phone.  The patient is a native of [place], [place].   HPI  Past Medical History:  Diagnosis Date  . Hypertension     Past Surgical History:  Procedure Laterality Date  . BREAST BIOPSY Right 02/21/2014   upper-outer calcs=atypical ductal hyperplasia and background of columnar cell change w/ calcifications. cytologic atypia per rad report from [place].  Marland Kitchen BREAST BIOPSY Right 02/21/2014   Central Right calcs=atypical ductal hyperplasia with atypical lobular hyperplasia in a background of columnar cell change with calcifications and papillary apocrine metaplasia per rad report from [place]  . BREAST BIOPSY Left 11/20/2017   Affirm Bx- path pending  . BREAST BIOPSY Right 11/20/2017   Affirm Bx of 2 areas- path pending  . BREAST EXCISIONAL BIOPSY Right 2015   "Benign" per pt  . CESAREAN SECTION      Family History  Problem Relation Age of Onset  . Hypertension Mother   . Stroke Father   . Breast cancer Neg Hx     Social History Social History   Tobacco Use  . Smoking status: Current Some Day Smoker  . Smokeless tobacco: Never Used  . Tobacco comment: Social  Substance Use Topics  . Alcohol use: Yes    Alcohol/week: 1.8 oz    Types: 3 Glasses of wine per week  . Drug use: No    No Known Allergies  Current Outpatient Medications  Medication Sig Dispense Refill  . amLODipine (NORVASC) 5 MG tablet Take 1 tablet (5 mg total) by mouth daily. 90 tablet 2   No current  facility-administered medications for this visit.     Review of Systems Review of Systems  Constitutional: Negative.   Respiratory: Negative.   Cardiovascular: Negative.     Blood pressure (!) 142/70, pulse 70, height 5\' 5"  (1.651 m), weight 137 lb (62.1 kg), last menstrual period 11/10/2017.  Physical Exam Physical Exam  Constitutional: She is oriented to person, place, and time. She appears well-developed and well-nourished.  Eyes: Conjunctivae are normal. No scleral icterus.  Neck: Neck supple.  Cardiovascular: Normal rate, regular rhythm and normal heart sounds.  Pulmonary/Chest: Effort normal and breath sounds normal. Right breast exhibits no inverted nipple, no mass, no nipple discharge, no skin change and no tenderness. Left breast exhibits no inverted nipple, no mass, no nipple discharge, no skin change and no tenderness.    Right breast  Incision upper outer quadrant  well healed.   Lymphadenopathy:    She has no cervical adenopathy.    She has no axillary adenopathy.  Neurological: She is alert and oriented to person, place, and time.  Skin: Skin is warm and dry.    Data Reviewed A. Breast, right, posterior UOQ  B. Breast, right, anterior UOQ  C. Breast, left, lower posterior   CLINICAL HISTORY:  Left breast post lower calcs-rule out cancer, right breast #1 post UOQ  calcs-rule out cancer, right breast #2 ant UOQ calcs-rule out cancer   PRE-OPERATIVE DIAGNOSIS:  None provided   POST-OPERATIVE DIAGNOSIS:  None provided.      DIAGNOSIS:  A. BREAST, RIGHT POSTERIOR UPPER OUTER QUADRANT; STEREOTACTIC BIOPSY:  - FLAT EPITHELIAL ATYPIA.  - COLUMNAR CELL CHANGE.  - PSEUDO-ANGIOMATOUS STROMAL HYPERPLASIA.  - CALCIFICATIONS ASSOCIATED WITH FLAT EPITHELIAL ATYPIA AND COLUMNAR  CELL CHANGE.   B. BREAST, RIGHT ANTERIOR UPPER OUTER QUADRANT; STEREOTACTIC BIOPSY:  - ATYPICAL DUCTAL HYPERPLASIA AND FLAT EPITHELIAL ATYPIA.  - APOCRINE METAPLASIA.  - COLUMNAR CELL  CHANGE.  - PSEUDO-ANGIOMATOUS STROMAL HYPERPLASIA.  - CALCIFICATIONS ASSOCIATED WITH ATYPICAL DUCTAL HYPERPLASIA, FLAT  EPITHELIAL ATYPIA, AND COLUMNAR CELL CHANGE.   C. BREAST, LEFT LOWER POSTERIOR; STEREOTACTIC BIOPSY:  - DUCTAL CARCINOMA IN SITU, LOW GRADE.  - SEE COMMENT.  - LOBULAR CARCINOMA IN SITU WITH PAGETOID EXTENSION OF DUCT.  - COLUMNAR CELL CHANGE WITH HYPERPLASIA.  - PSEUDO-ANGIOMATOUS STROMAL HYPERPLASIA.  - APOCRINE METAPLASIA.  - CALCIFICATIONS ASSOCIATED WITH DCIS.    Ultrasound examination of both breasts were completed to determine if preoperative wire localization would be required if surgical intervention is necessary.  The patient has very dense breast.  Examination of the left breast in the lower inner quadrant 7 o'clock position, 7 cm from the nipple suggest a biopsy cavity somewhat elongated measuring 0.7 x 1.4 x 1.9 cm.  BI-RADS-6.  Examination of the right breast in the upper outer quadrant where there is palpable thickening showed a 0.55 x 0.7 x 0.74 hypoechoic area suggestive of the biopsy site.  In the 10 o'clock position 2 cm the nipple a similar appearing area is noted measuring up to 0.77 cm in diameter.  At present, these findings are fairly soft for identification of the biopsy completed last week.   Assessment    Low-grade DCIS involving the left breast with incidental finding of LCIS.  ER/PR positive.  Atypical changes in the right breast in 2 locations.  Past history right breast biopsy for atypia.  Review of pathology pending.    Plan  The patient's case was presented at the [facility] tumor board this evening.  She is a likely candidate for genetic testing based on the recent diagnosis of DCIS,  Even though she has no family history of breast cancer.  The patient is scheduled to start a new job next week.  She is very concerned about being able to complete her training program and to get established in her new position.  With the findings of  low-grade DCIS I see no problems with the delay of intervention should this be best for her work requirements.  We discussed the COMET trial for low-grade DCIS.  She may or may not be a candidate based on the contralateral breast findings.  The need to review her 2015 biopsy results from [facility] were discussed, and the patient was amenable to signing a record release.  Should she not be a candidate for the clinical trial, she would be best served by wide excision followed by radiation and antiestrogen therapy.  The right breast would warrant reexcision of these areas to confirm no upstaging.  If her genetic testing is positive other treatment options might be more appropriate.  Of note, during the case reviewed this evening the patient did undergo an MRI in 2015 showing diffuse non-mass enhancement involving the diffuse area of microcalcification in the right breast.  Again previous biopsy was negative (by patient report).  It is little unclear why she was not offered chemoprevention if she had ADH in 2015.  We spent approximately 45  minutes reviewing options and establishing a treatment plan.  HPI, Physical Exam, Assessment and Plan have been scribed under the direction and in the presence of Hervey Ard, MD.  Gaspar Cola, CMA   I have completed the exam and reviewed the above documentation for accuracy and completeness.  I agree with the above.  Haematologist has been used and any errors in dictation or transcription are unintentional.  Hervey Ard, M.D., F.A.C.S.  Forest Gleason Traevon Meiring 11/27/2017, 8:57 PM

## 2017-11-28 ENCOUNTER — Telehealth: Payer: Self-pay | Admitting: *Deleted

## 2017-11-28 NOTE — Telephone Encounter (Signed)
-----   Message from Robert Bellow, MD sent at 11/28/2017 10:44 AM EST ----- Patient candidate for genetic testing. See if she is interested while we await records from [place] and determination if she can participate in the COMET trial.

## 2017-11-28 NOTE — Telephone Encounter (Signed)
I talked with the patient and she initially was ok with proceeding with testing but wanted authorization with insurance before having the test done.([employer]) She then called back with her spouse on the line and thought that the genetic testing had something to do with being a participate with the trial study. I explained that it was something separate, she still wanted to think about it before she had the genetic testing completed. Aware to call back for further questions or concerns and if and when she decides to proceed.

## 2017-11-28 NOTE — Telephone Encounter (Signed)
Patient called back and stated that she doesn't want to go through anymore testing or studies, she just wants to proceed with surgery.

## 2017-11-29 ENCOUNTER — Encounter: Payer: Self-pay | Admitting: *Deleted

## 2017-12-07 ENCOUNTER — Telehealth: Payer: Self-pay

## 2017-12-07 NOTE — Telephone Encounter (Signed)
-----   Message from Robert Bellow, MD sent at 12/07/2017  9:29 AM EST ----- Please notify the patient that I received her records from [facility].  We are to have a sit down to review options for management of her breast.  See if she is decided on whether to proceed with genetic testing.  Thank you

## 2017-12-08 ENCOUNTER — Encounter: Payer: Self-pay | Admitting: General Surgery

## 2017-12-08 NOTE — Progress Notes (Signed)
Pathology records been obtained from [facility] regarding the patient's stereotactic biopsy and subsequent surgical excision in spring 2015.  Stereotactic biopsy of 2 locations upper outer quadrant and right central breast showed atypical ductal hyperplasia with columnar cell changes in both locations.  2 sets of samples were submitted for each location the area of calcifications and then the surrounding tissue in 2 separate containers, 4 samples and all.  Three of 4 submitted samples showed atypical ductal hyperplasia.  This biopsy was done on Feb 21, 2014.  The patient subsequently underwent formal excision on March 12, 2014 with 2 samples submitted.  The upper outer quadrant at 10:00 showed benign breast parenchyma with focally hyalinized stroma and dystrophic microcalcifications and benign breast reveals.  Biopsy site was identified.  Columnar cell changes noted.  Of note, no additional atypical ductal hyperplasia.  Right breast lumpectomy from the 8 o'clock position (central) showed resolving biopsy site, dystrophic microcalcifications and benign breast lobules, columnar cell changes and a 0.1 cm foci of atypical ductal hyperplasia with micropapillary hyperplasia.

## 2017-12-12 ENCOUNTER — Ambulatory Visit (INDEPENDENT_AMBULATORY_CARE_PROVIDER_SITE_OTHER): Payer: BC Managed Care – PPO | Admitting: General Surgery

## 2017-12-12 ENCOUNTER — Encounter: Payer: Self-pay | Admitting: General Surgery

## 2017-12-12 VITALS — BP 136/84 | HR 80 | Ht 65.0 in | Wt 136.0 lb

## 2017-12-12 DIAGNOSIS — D0512 Intraductal carcinoma in situ of left breast: Secondary | ICD-10-CM

## 2017-12-12 DIAGNOSIS — N6091 Unspecified benign mammary dysplasia of right breast: Secondary | ICD-10-CM

## 2017-12-12 NOTE — Patient Instructions (Signed)
The patient is aware to call back for any questions or new concerns.  

## 2017-12-12 NOTE — Progress Notes (Signed)
Patient ID: Kendra Harrington, female   DOB: May 15, 1971, 47 y.o.   MRN: [id number]  Chief Complaint  Patient presents with  . Breast Problem    HPI Kendra Harrington is a 47 y.o. female.  Here to discuss right and left breast surgery.    HPI  Past Medical History:  Diagnosis Date  . Cancer ([place]) 11/20/2017   DUCTAL CARCINOMA IN SITU left breast  . Hypertension     Past Surgical History:  Procedure Laterality Date  . BREAST BIOPSY Right 02/21/2014   upper-outer calcs=atypical ductal hyperplasia and background of columnar cell change w/ calcifications. cytologic atypia per rad report from [place].  Marland Kitchen BREAST BIOPSY Right 02/21/2014   Central Right calcs=atypical ductal hyperplasia with atypical lobular hyperplasia in a background of columnar cell change with calcifications and papillary apocrine metaplasia per rad report from [place]  . BREAST BIOPSY Left 11/20/2017   Affirm Bx- DUCTAL CARCINOMA IN SITU  . BREAST BIOPSY Right 11/20/2017   Affirm Bx of 2 areas-  ATYPICAL DUCTAL HYPERPLASIA and PSEUDO-ANGIOMATOUS STROMAL HYPERPLASIA  . BREAST EXCISIONAL BIOPSY Right 2015   "Benign" per pt  . CESAREAN SECTION      Family History  Problem Relation Age of Onset  . Hypertension Mother   . Stroke Father   . Breast cancer Neg Hx     Social History Social History   Tobacco Use  . Smoking status: Current Some Day Smoker  . Smokeless tobacco: Never Used  . Tobacco comment: Social  Substance Use Topics  . Alcohol use: Yes    Alcohol/week: 1.8 oz    Types: 3 Glasses of wine per week  . Drug use: No    Allergies  Allergen Reactions  . Peanut-Containing Drug Products Anaphylaxis    Current Outpatient Medications  Medication Sig Dispense Refill  . amLODipine (NORVASC) 5 MG tablet Take 1 tablet (5 mg total) by mouth daily. 90 tablet 2   No current facility-administered medications for this visit.     Review of Systems Review of Systems  Constitutional: Negative.    Respiratory: Negative.   Cardiovascular: Negative.     Blood pressure 136/84, pulse 80, height _0  (1.651 m), weight 136 lb (61.7 kg), last menstrual period 11/28/2017.  Physical Exam Physical Exam  Constitutional: She is oriented to person, place, and time. She appears well-developed and well-nourished.  HENT:  Mouth/Throat: Oropharynx is clear and moist.  Eyes: Conjunctivae are normal. No scleral icterus.  Neck: Neck supple.  Cardiovascular: Normal rate, regular rhythm and normal heart sounds.  Pulmonary/Chest: Effort normal and breath sounds normal. Right breast exhibits no inverted nipple, no mass, no nipple discharge, no skin change and no tenderness. Left breast exhibits no inverted nipple, no mass, no nipple discharge, no skin change and no tenderness.  Lymphadenopathy:    She has no cervical adenopathy.    She has no axillary adenopathy.  Neurological: She is alert and oriented to person, place, and time.  Skin: Skin is warm and dry.  Psychiatric: Her behavior is normal.    Data Reviewed Records from her 2015 right breast stereotactic biopsy (x2) and local excision completed at [facility] in 2015 were reviewed.  Both core biopsy showed evidence of ADH.  Wide excision including both previously placed biopsy clip showed only a small foci of residual ADH.  Assessment    Multifocal ADH involving the right breast over the last 4 years.  DCIS involving the left breast, low-grade.    Plan  The patient reports that she was not offered chemoprevention in 2015.  This is unclear if she would certainly meet criteria based on age and pathology.  She has 2 new areas of atypical ductal hyperplasia involving the right breast.  Low-grade DCIS on the left.  Patient was strongly encouraged to consider genetic testing based on the pathology over the last 4 years as this might impact her surgical options.  If she was BRCA 1/2+, would strongly suggest bilateral mastectomy with  reconstruction.  She has "A" cup breasts, and repeat "wiggling" may lead to either significant asymmetry or deformity.  If she is BRCA negative, options would include participation in the COMET trial versus standard therapy (surgical excision/radiation/antiestrogen on the left, surgical excision on the right.  We spent a significant amount of time reviewing indications for genetic testing and pros and cons of electing to randomized into the Comet trial.  The patient is willing to discuss with the research group participation once her BRCA testing is available.        HPI, Physical Exam, Assessment and Plan have been scribed under the direction and in the presence of Robert Bellow, MD. Kendra Fetch, RN   I have completed the exam and reviewed the above documentation for accuracy and completeness.  I agree with the above.  Haematologist has been used and any errors in dictation or transcription are unintentional.  Hervey Ard, M.D., F.A.C.S.   Kendra Harrington 12/13/2017, 9:21 PM

## 2017-12-13 DIAGNOSIS — N6091 Unspecified benign mammary dysplasia of right breast: Secondary | ICD-10-CM | POA: Insufficient documentation

## 2017-12-14 ENCOUNTER — Ambulatory Visit: Payer: BC Managed Care – PPO

## 2017-12-21 ENCOUNTER — Telehealth: Payer: Self-pay | Admitting: *Deleted

## 2017-12-21 ENCOUNTER — Telehealth: Payer: Self-pay | Admitting: General Surgery

## 2017-12-21 NOTE — Telephone Encounter (Signed)
PATIENT CALLED TODAY QUESTIONING WHY SHE HAD AN APPOINTMENT ON 12-25-17(GENETIC TESTING).SHE SPOKE WITH YOU ON 12-07-17 & WAS WAITING TO HEAR BACK FROM WHAT HER INSURANCE SAID. SHE RECEIVED A REMINDER CALL TODAY & WANTED TO KNOW WHY SHE WAS SCHEDULED.PLEASE CALL HER.

## 2017-12-21 NOTE — Telephone Encounter (Signed)
Authorization letter was received from [facility]. They will call her once specimen has been sent if the out of pocket amount is over $100

## 2017-12-21 NOTE — Telephone Encounter (Signed)
Left detailed message, ok for appt on 12-26-17

## 2017-12-26 ENCOUNTER — Ambulatory Visit (INDEPENDENT_AMBULATORY_CARE_PROVIDER_SITE_OTHER): Payer: BC Managed Care – PPO | Admitting: *Deleted

## 2017-12-26 DIAGNOSIS — D0512 Intraductal carcinoma in situ of left breast: Secondary | ICD-10-CM

## 2017-12-26 DIAGNOSIS — Z1371 Encounter for nonprocreative screening for genetic disease carrier status: Secondary | ICD-10-CM

## 2017-12-26 HISTORY — DX: Encounter for nonprocreative screening for genetic disease carrier status: Z13.71

## 2017-12-26 NOTE — Progress Notes (Signed)
Patient ID: Kendra Harrington, female   DOB: [birth year], 47 y.o.   MRN: [id number]  Genetic testing completed. Aware testing takes 2-3 weeks.

## 2017-12-26 NOTE — Patient Instructions (Signed)
The patient is aware to call back for any questions or concerns.  

## 2018-01-09 ENCOUNTER — Telehealth: Payer: Self-pay | Admitting: General Surgery

## 2018-01-09 ENCOUNTER — Telehealth: Payer: Self-pay | Admitting: *Deleted

## 2018-01-09 NOTE — Telephone Encounter (Signed)
Left detailed message that no results available yet.

## 2018-01-09 NOTE — Telephone Encounter (Signed)
Patient called & would like to talk with Dr Dwyane Luo nurse.She did not give a reason.Please call.

## 2018-01-09 NOTE — Telephone Encounter (Signed)
Patient called and wants to know results for her genetic testing

## 2018-01-23 ENCOUNTER — Encounter: Payer: Self-pay | Admitting: *Deleted

## 2018-01-23 ENCOUNTER — Telehealth: Payer: Self-pay | Admitting: *Deleted

## 2018-01-23 NOTE — Telephone Encounter (Signed)
She needs follow up appointment to discuss treatment options per Dr Bary Castilla

## 2018-01-23 NOTE — Telephone Encounter (Signed)
Left detailed message that genetic testing was negative and that Dr Bary Castilla wants her to come in to discuss treatment options.

## 2018-01-23 NOTE — Telephone Encounter (Signed)
[facility] genetic testing results, were negative.

## 2018-01-24 ENCOUNTER — Encounter: Payer: Self-pay | Admitting: General Surgery

## 2018-02-06 ENCOUNTER — Ambulatory Visit: Payer: BC Managed Care – PPO | Admitting: General Surgery

## 2018-04-10 ENCOUNTER — Encounter: Payer: Self-pay | Admitting: General Surgery

## 2018-04-10 ENCOUNTER — Ambulatory Visit (INDEPENDENT_AMBULATORY_CARE_PROVIDER_SITE_OTHER): Payer: BC Managed Care – PPO | Admitting: General Surgery

## 2018-04-10 VITALS — BP 140/72 | HR 78 | Resp 14 | Ht 63.0 in | Wt 148.0 lb

## 2018-04-10 DIAGNOSIS — D0512 Intraductal carcinoma in situ of left breast: Secondary | ICD-10-CM | POA: Diagnosis not present

## 2018-04-10 DIAGNOSIS — N6091 Unspecified benign mammary dysplasia of right breast: Secondary | ICD-10-CM | POA: Diagnosis not present

## 2018-04-10 NOTE — Patient Instructions (Addendum)
Patient to go talk Kendra Harrington for the study. The patient is aware to call back for any questions or concerns.

## 2018-04-10 NOTE — Progress Notes (Signed)
Patient ID: Kendra Harrington, female   DOB: 1971-07-02, 47 y.o.   MRN: [id number]  Chief Complaint  Patient presents with  . Follow-up    HPI Kendra Harrington is a 47 y.o. female here today to discuss surgery options. Husband, Christie Beckers is present at visit.  HPI  Past Medical History:  Diagnosis Date  . BRCA negative 12/26/2017   [facility]   . Cancer ([place]) 11/20/2017   DUCTAL CARCINOMA IN SITU left breast  . Hypertension     Past Surgical History:  Procedure Laterality Date  . BREAST BIOPSY Right 02/21/2014   upper-outer calcs=atypical ductal hyperplasia and background of columnar cell change w/ calcifications. cytologic atypia per rad report from [place].  Marland Kitchen BREAST BIOPSY Right 02/21/2014   Central Right calcs=atypical ductal hyperplasia with atypical lobular hyperplasia in a background of columnar cell change with calcifications and papillary apocrine metaplasia per rad report from [place]  . BREAST BIOPSY Left 11/20/2017   Affirm Bx- DUCTAL CARCINOMA IN SITU  . BREAST BIOPSY Right 11/20/2017   Affirm Bx of 2 areas-  ATYPICAL DUCTAL HYPERPLASIA and PSEUDO-ANGIOMATOUS STROMAL HYPERPLASIA  . BREAST EXCISIONAL BIOPSY Right 2015   "Benign" per pt  . CESAREAN SECTION      Family History  Problem Relation Age of Onset  . Hypertension Mother   . Stroke Father   . Breast cancer Neg Hx     Social History Social History   Tobacco Use  . Smoking status: Current Some Day Smoker  . Smokeless tobacco: Never Used  . Tobacco comment: Social  Substance Use Topics  . Alcohol use: Yes    Alcohol/week: 1.8 oz    Types: 3 Glasses of wine per week  . Drug use: No    Allergies  Allergen Reactions  . Peanut-Containing Drug Products Anaphylaxis    Current Outpatient Medications  Medication Sig Dispense Refill  . amLODipine (NORVASC) 5 MG tablet Take 1 tablet (5 mg total) by mouth daily. 90 tablet 2   No current facility-administered medications for this visit.     Review of  Systems Review of Systems  Constitutional: Negative.   Respiratory: Negative.   Cardiovascular: Negative.     Blood pressure 140/72, pulse 78, resp. rate 14, height 5' 3"  (1.6 m), weight 148 lb (67.1 kg).  Physical Exam Physical Exam  Data Reviewed DIAGNOSIS:  A. BREAST, RIGHT POSTERIOR UPPER OUTER QUADRANT; STEREOTACTIC BIOPSY:  - FLAT EPITHELIAL ATYPIA.  - COLUMNAR CELL CHANGE.  - PSEUDO-ANGIOMATOUS STROMAL HYPERPLASIA.  - CALCIFICATIONS ASSOCIATED WITH FLAT EPITHELIAL ATYPIA AND COLUMNAR  CELL CHANGE.   B. BREAST, RIGHT ANTERIOR UPPER OUTER QUADRANT; STEREOTACTIC BIOPSY:  - ATYPICAL DUCTAL HYPERPLASIA AND FLAT EPITHELIAL ATYPIA.  - APOCRINE METAPLASIA.  - COLUMNAR CELL CHANGE.  - PSEUDO-ANGIOMATOUS STROMAL HYPERPLASIA.  - CALCIFICATIONS ASSOCIATED WITH ATYPICAL DUCTAL HYPERPLASIA, FLAT  EPITHELIAL ATYPIA, AND COLUMNAR CELL CHANGE.   C. BREAST, LEFT LOWER POSTERIOR; STEREOTACTIC BIOPSY:  - DUCTAL CARCINOMA IN SITU, LOW GRADE.  - SEE COMMENT.  - LOBULAR CARCINOMA IN SITU WITH PAGETOID EXTENSION OF DUCT.  - COLUMNAR CELL CHANGE WITH HYPERPLASIA.  - PSEUDO-ANGIOMATOUS STROMAL HYPERPLASIA.  - APOCRINE METAPLASIA.  - CALCIFICATIONS ASSOCIATED WITH DCIS.  BREAST BIOMARKER TESTS  Estrogen Receptor (ER) Status: Positive, greater than 90%    Average intensity of staining: Strong  Progesterone Receptor (PgR) Status: Positive, greater than 90%    Average intensity of staining: Strong   Assessment    Left breast DCIS, LCIS.  Atypical hyperplasia of the right breast x2.  Plan  The patient had been informed about the Comet trial at the time of her initial evaluation in February, pending decision after genetic testing results have been obtained.  She spoke with Sharyne Richters, RN by phone in February/March, and at that time was uncommitted to participation.  We reviewed the trial today, and she was interested.  I contacted Ms. Shoffner and find that the patient is  outside the 120-day window from diagnosis to enrollment, and is no longer eligible for participation.  This information was not available at the time of the patient's visit.  Were now left with standard therapy which would be wide excision with postoperative radiation and antiestrogen therapy for left breast management and consideration of reexcision of the 2 ADH sites in the right breast.  She has a modest breast volume, and is undergone 2 previous right biopsies.  We will need to have a long sit down regarding her surgical treatment now that, participation i in the Comet trial is no longer possible.     The patient is aware to call back for any questions or concerns.  HPI, Physical Exam, Assessment and Plan have been scribed under the direction and in the presence of Hervey Ard, MD.  Gaspar Cola, CMA  Forest Gleason Katelin Kutsch 04/11/2018, 5:47 PM

## 2018-04-13 ENCOUNTER — Telehealth: Payer: Self-pay

## 2018-04-13 NOTE — Telephone Encounter (Signed)
I spoke with the patient and let her know that she is outside the window for the comet trial. She would like to go ahead and schedule surgery. She did not feel a need to speak with you about this first as she said you had gone over this information with her already when you saw her. I let her know that you would probably still need to see her prior to surgery for an exam and she was fine with this. She would just like to go ahead and "get the ball rolling" with her treatment.

## 2018-04-13 NOTE — Telephone Encounter (Signed)
-----   Message from Robert Bellow, MD sent at 04/11/2018  5:47 PM EDT ----- Please notify the patient that I spoke with Ms. Shoffner from the cancer center regarding the patient's participation in the Comet trial.  She is unfortunately outside the 120-day window between biopsy and randomization, so this is no longer an option.  Will need to have a detailed sit down visit about surgical options at her convenience.

## 2018-04-17 ENCOUNTER — Encounter: Payer: BC Managed Care – PPO | Admitting: Family Medicine

## 2018-04-17 NOTE — Telephone Encounter (Signed)
Spoke with the patient about scheduling her surgery. She is aware that she will have two locations on her right breast and one location on her left breast that will need to have wire localization. She is amendable to this and wants to get all areas removed. The patient is scheduled for surgery at [facility] on 05/18/18. She will pre admit by phone. We will call her with her arrival time for her surgery.

## 2018-04-18 ENCOUNTER — Telehealth: Payer: Self-pay | Admitting: *Deleted

## 2018-04-18 NOTE — Telephone Encounter (Signed)
She wanted to make sure that the surgery was removing the "bad part" and not the whole breast, so was confused when she got home and talked with her husband, this was confirmed, a breast biopsy scheduled.

## 2018-04-19 ENCOUNTER — Other Ambulatory Visit: Payer: Self-pay | Admitting: General Surgery

## 2018-04-19 ENCOUNTER — Other Ambulatory Visit: Payer: Self-pay

## 2018-04-19 ENCOUNTER — Telehealth: Payer: Self-pay

## 2018-04-19 ENCOUNTER — Ambulatory Visit: Payer: BC Managed Care – PPO

## 2018-04-19 DIAGNOSIS — D0512 Intraductal carcinoma in situ of left breast: Secondary | ICD-10-CM

## 2018-04-19 DIAGNOSIS — N6091 Unspecified benign mammary dysplasia of right breast: Secondary | ICD-10-CM

## 2018-04-20 NOTE — Telephone Encounter (Signed)
Error

## 2018-05-08 ENCOUNTER — Encounter: Payer: Self-pay | Admitting: General Surgery

## 2018-05-08 ENCOUNTER — Ambulatory Visit (INDEPENDENT_AMBULATORY_CARE_PROVIDER_SITE_OTHER): Payer: BC Managed Care – PPO | Admitting: General Surgery

## 2018-05-08 VITALS — BP 138/88 | HR 80 | Resp 12 | Ht 63.0 in | Wt 153.0 lb

## 2018-05-08 DIAGNOSIS — D0512 Intraductal carcinoma in situ of left breast: Secondary | ICD-10-CM | POA: Diagnosis not present

## 2018-05-08 NOTE — Patient Instructions (Addendum)
Know the size of your bra for surgery    The patient is aware to call back for any questions or new concerns.

## 2018-05-08 NOTE — Progress Notes (Signed)
Patient ID: Kendra Harrington, female   DOB: Nov 14, 1970, 47 y.o.   MRN: [id number]  Chief Complaint  Patient presents with  . Pre-op Exam    HPI Kendra Harrington is a 47 y.o. female.  Here for pre op left and right breast biopsy with needle localization on 05-18-18, for DCIS left breast and ADH right breast.  Husband, Christie Beckers is present at visit.    HPI  Past Medical History:  Diagnosis Date  . BRCA negative 12/26/2017   [facility]   . Cancer ([place]) 11/20/2017   DUCTAL CARCINOMA IN SITU left breast  . Hypertension     Past Surgical History:  Procedure Laterality Date  . BREAST BIOPSY Right 02/21/2014   upper-outer calcs=atypical ductal hyperplasia and background of columnar cell change w/ calcifications. cytologic atypia per rad report from [place].  Marland Kitchen BREAST BIOPSY Right 02/21/2014   Central Right calcs=atypical ductal hyperplasia with atypical lobular hyperplasia in a background of columnar cell change with calcifications and papillary apocrine metaplasia per rad report from [place]  . BREAST BIOPSY Left 11/20/2017   Affirm Bx- DUCTAL CARCINOMA IN SITU  . BREAST BIOPSY Right 11/20/2017   Affirm Bx of 2 areas-  ATYPICAL DUCTAL HYPERPLASIA and PSEUDO-ANGIOMATOUS STROMAL HYPERPLASIA  . BREAST EXCISIONAL BIOPSY Right 2015   "Benign" per pt  . CESAREAN SECTION      Family History  Problem Relation Age of Onset  . Hypertension Mother   . Stroke Father   . Breast cancer Neg Hx     Social History Social History   Tobacco Use  . Smoking status: Current Some Day Smoker  . Smokeless tobacco: Never Used  . Tobacco comment: Social  Substance Use Topics  . Alcohol use: Yes    Alcohol/week: 1.8 oz    Types: 3 Glasses of wine per week  . Drug use: No    Allergies  Allergen Reactions  . Peanut-Containing Drug Products Anaphylaxis    Current Outpatient Medications  Medication Sig Dispense Refill  . amLODipine (NORVASC) 5 MG tablet Take 1 tablet (5 mg total) by mouth daily. 90  tablet 2   No current facility-administered medications for this visit.     Review of Systems Review of Systems  Constitutional: Negative.   Respiratory: Negative.   Cardiovascular: Negative.     Blood pressure 138/88, pulse 80, resp. rate 12, height 5' 3"  (1.6 m), weight 153 lb (69.4 kg), last menstrual period 04/27/2018, SpO2 100 %.  Physical Exam Physical Exam  Constitutional: She is oriented to person, place, and time. She appears well-developed and well-nourished.  HENT:  Mouth/Throat: Oropharynx is clear and moist.  Eyes: Conjunctivae are normal. No scleral icterus.  Neck: Neck supple.  Cardiovascular: Normal rate, regular rhythm and normal heart sounds.  Pulmonary/Chest: Effort normal and breath sounds normal.  Neurological: She is alert and oriented to person, place, and time.  Skin: Skin is warm and dry.  Psychiatric: Her behavior is normal.    Data Reviewed 2 foci of ADH on the right, 1 foci of DCIS/LCIS on the left. The patient had gone outside the 120-day window to participate in the [place] trial.  Assessment    Once again, multiple foci of ADH in the right breast.  New foci of LCIS/DCIS in the left breast.  Patient desires breast conservation.    Plan    Indications for wire localization of all the lesions and that this should be better tolerated than the 3 biopsies was reviewed.  Aware to know her bra  size day of surgery  Surgery as scheduled     HPI, Physical Exam, Assessment and Plan have been scribed under the direction and in the presence of Robert Bellow, MD. Karie Fetch, RN  I have completed the exam and reviewed the above documentation for accuracy and completeness.  I agree with the above.  Haematologist has been used and any errors in dictation or transcription are unintentional.  Hervey Ard, M.D., F.A.C.S.  Forest Gleason Zandyr Barnhill 05/09/2018, 6:42 PM

## 2018-05-11 ENCOUNTER — Inpatient Hospital Stay
Admission: RE | Admit: 2018-05-11 | Discharge: 2018-05-11 | Disposition: A | Discharge status: Home / Self Care | Payer: BC Managed Care – PPO | Source: Ambulatory Visit

## 2018-05-11 NOTE — Patient Instructions (Signed)
Your procedure is scheduled on:  Friday, May 18, 2018 Report to Day Surgery on the 2nd floor of the [facility]. To find out your arrival time, please call [phone] between 1PM - 3PM on: Thursday, May 17, 2018  REMEMBER: Instructions that are not followed completely may result in serious medical risk, up to and including death; or upon the discretion of your surgeon and anesthesiologist your surgery may need to be rescheduled.  Do not eat food after midnight the night before surgery.  No gum chewing, lozengers or hard candies.  You may however, drink CLEAR liquids up to 2 hours before you are scheduled to arrive for your surgery. Do not drink anything within 2 hours of the start of your surgery.  Clear liquids include: - water  - apple juice without pulp - gatorade - black coffee or tea (Do NOT add milk or creamers to the coffee or tea) Do NOT drink anything that is not on this list.  No Alcohol for 24 hours before or after surgery.  No Smoking including e-cigarettes for 24 hours prior to surgery.  No chewable tobacco products for at least 6 hours prior to surgery.  No nicotine patches on the day of surgery.  On the morning of surgery brush your teeth with toothpaste and water, you may rinse your mouth with mouthwash if you wish. Do not swallow any toothpaste or mouthwash.  Notify your doctor if there is any change in your medical condition (cold, fever, infection).  Do not wear jewelry, make-up, hairpins, clips or nail polish.  Do not wear lotions, powders, or perfumes. You may wear deodorant.  Do not shave 48 hours prior to surgery.   Contacts and dentures may not be worn into surgery.  Do not bring valuables to the hospital, including drivers license, insurance or credit cards.  [facility] is not responsible for any belongings or valuables.   TAKE THESE MEDICATIONS THE MORNING OF SURGERY:  1.  Amlodipine  Use CHG Soap as directed on instruction  sheet.  NOW!  Stop Anti-inflammatories (NSAIDS) such as Advil, Aleve, Ibuprofen, Motrin, Naproxen, Naprosyn and Aspirin based products such as Excedrin, Goodys Powder, BC Powder. (May take Tylenol or Acetaminophen if needed.)  NOW!  Stop ANY OVER THE COUNTER supplements until after surgery. (May continue Vitamin D, Vitamin B, and multivitamin.)  Wear comfortable clothing (specific to your surgery type) to the hospital.  Plan for stool softeners for home use.  If you are being discharged the day of surgery, you will not be allowed to drive home. You will need a responsible adult to drive you home and stay with you that night.   If you are taking public transportation, you will need to have a responsible adult with you. Please confirm with your physician that it is acceptable to use public transportation.   Please call [phone] if you have any questions about these instructions.

## 2018-05-14 ENCOUNTER — Inpatient Hospital Stay: Admission: RE | Admit: 2018-05-14 | Payer: BC Managed Care – PPO | Source: Ambulatory Visit

## 2018-05-14 ENCOUNTER — Telehealth: Payer: Self-pay | Admitting: General Surgery

## 2018-05-14 NOTE — Telephone Encounter (Signed)
Patient called in wanting to know if she had answered all the questions she needed to prior to surgery on Friday with Dr Bary Castilla. She said someone called her last week. I don't know who it was there isn't a telephone message in the system. Please call her to make sure she's ready for surgery.

## 2018-05-15 ENCOUNTER — Encounter
Admission: RE | Admit: 2018-05-15 | Discharge: 2018-05-15 | Disposition: A | Discharge status: Home / Self Care | Payer: BC Managed Care – PPO | Source: Ambulatory Visit | Attending: General Surgery | Admitting: General Surgery

## 2018-05-15 ENCOUNTER — Other Ambulatory Visit: Payer: Self-pay

## 2018-05-15 HISTORY — DX: Gastro-esophageal reflux disease without esophagitis: K21.9

## 2018-05-15 NOTE — Patient Instructions (Signed)
Your procedure is scheduled on: 05-18-18 FRIDAY Report to [facility] @ 7:45 AM  Remember: Instructions that are not followed completely may result in serious medical risk, up to and including death, or upon the discretion of your surgeon and anesthesiologist your surgery may need to be rescheduled.    _x___ 1. Do not eat food after midnight the night before your procedure. You may drink clear liquids up to 2 hours before you are scheduled to arrive at the hospital for your procedure.  Do not drink clear liquids within 2 hours of your scheduled arrival to the hospital.  Clear liquids include  --Water or Apple juice without pulp  --Clear carbohydrate beverage such as ClearFast or Gatorade  --Black Coffee or Clear Tea (No milk, no creamers, do not add anything to the coffee or Tea   ____Ensure clear carbohydrate drink on the way to the hospital for bariatric patients  ____Ensure clear carbohydrate drink 3 hours before surgery for Dr Dwyane Luo patients if physician instructed.   No gum chewing or hard candies.     __x__ 2. No Alcohol for 24 hours before or after surgery.   __x__3. No Smoking or e-cigarettes for 24 prior to surgery.  Do not use any chewable tobacco products for at least 6 hour prior to surgery   ____  4. Bring all medications with you on the day of surgery if instructed.    __x__ 5. Notify your doctor if there is any change in your medical condition     (cold, fever, infections).    x___6. On the morning of surgery brush your teeth with toothpaste and water.  You may rinse your mouth with mouth wash if you wish.  Do not swallow any toothpaste or mouthwash.   Do not wear jewelry, make-up, hairpins, clips or nail polish.  Do not wear lotions, powders, or perfumes. You may wear deodorant.  Do not shave 48 hours prior to surgery. Men may shave face and neck.  Do not bring valuables to the hospital.    [facility] is not responsible for any belongings or  valuables.               Contacts, dentures or bridgework may not be worn into surgery.  Leave your suitcase in the car. After surgery it may be brought to your room.  For patients admitted to the hospital, discharge time is determined by your treatment team.  _  Patients discharged the day of surgery will not be allowed to drive home.  You will need someone to drive you home and stay with you the night of your procedure.    Please read over the following fact sheets that you were given:   [facility] Preparing for Surgery    _x___ TAKE THE FOLLOWING MEDICATION THE MORNING OF SURGERY WITH A SMALL SIP OF WATER. These include:  1. AMLODIPINE (NORVASC)  2.  3.  4.  5.  6.  ____Fleets enema or Magnesium Citrate as directed.   _x___ Use CHG Soap or sage wipes as directed on instruction sheet   ____ Use inhalers on the day of surgery and bring to hospital day of surgery  ____ Stop Metformin and Janumet 2 days prior to surgery.    ____ Take 1/2 of usual insulin dose the night before surgery and none on the morning surgery.   ____ Follow recommendations from Cardiologist, Pulmonologist or PCP regarding stopping Aspirin, Coumadin, Plavix ,Eliquis, Effient, or Pradaxa, and Pletal.  ____Stop Anti-inflammatories  such as Advil, Aleve, Ibuprofen, Motrin, Naproxen, Naprosyn, Goodies powders or aspirin products. OK to take Tylenol    ____ Stop supplements until after surgery.     ____ Bring C-Pap to the hospital.

## 2018-05-16 ENCOUNTER — Encounter
Admission: RE | Admit: 2018-05-16 | Discharge: 2018-05-16 | Disposition: A | Discharge status: Home / Self Care | Payer: BC Managed Care – PPO | Source: Ambulatory Visit | Attending: General Surgery | Admitting: General Surgery

## 2018-05-16 DIAGNOSIS — I10 Essential (primary) hypertension: Secondary | ICD-10-CM | POA: Insufficient documentation

## 2018-05-18 ENCOUNTER — Ambulatory Visit
Admission: RE | Admit: 2018-05-18 | Discharge: 2018-05-18 | Disposition: A | Discharge status: Home / Self Care | Payer: BC Managed Care – PPO | Source: Ambulatory Visit | Attending: General Surgery | Admitting: General Surgery

## 2018-05-18 ENCOUNTER — Ambulatory Visit: Payer: BC Managed Care – PPO

## 2018-05-18 ENCOUNTER — Encounter: Payer: Self-pay | Admitting: *Deleted

## 2018-05-18 ENCOUNTER — Encounter
Admission: RE | Disposition: A | Discharge status: Home / Self Care | Payer: Self-pay | Source: Ambulatory Visit | Attending: General Surgery

## 2018-05-18 ENCOUNTER — Ambulatory Visit: Payer: BC Managed Care – PPO | Admitting: Anesthesiology

## 2018-05-18 ENCOUNTER — Encounter: Payer: BC Managed Care – PPO | Admitting: Family Medicine

## 2018-05-18 ENCOUNTER — Other Ambulatory Visit: Payer: Self-pay

## 2018-05-18 DIAGNOSIS — D0512 Intraductal carcinoma in situ of left breast: Secondary | ICD-10-CM

## 2018-05-18 DIAGNOSIS — C50411 Malignant neoplasm of upper-outer quadrant of right female breast: Secondary | ICD-10-CM | POA: Insufficient documentation

## 2018-05-18 DIAGNOSIS — Z79899 Other long term (current) drug therapy: Secondary | ICD-10-CM | POA: Insufficient documentation

## 2018-05-18 DIAGNOSIS — Z170 Estrogen receptor positive status [ER+]: Secondary | ICD-10-CM | POA: Insufficient documentation

## 2018-05-18 DIAGNOSIS — F17200 Nicotine dependence, unspecified, uncomplicated: Secondary | ICD-10-CM | POA: Insufficient documentation

## 2018-05-18 DIAGNOSIS — Z8249 Family history of ischemic heart disease and other diseases of the circulatory system: Secondary | ICD-10-CM | POA: Diagnosis not present

## 2018-05-18 DIAGNOSIS — I10 Essential (primary) hypertension: Secondary | ICD-10-CM | POA: Insufficient documentation

## 2018-05-18 DIAGNOSIS — C50112 Malignant neoplasm of central portion of left female breast: Secondary | ICD-10-CM

## 2018-05-18 DIAGNOSIS — Z823 Family history of stroke: Secondary | ICD-10-CM | POA: Insufficient documentation

## 2018-05-18 DIAGNOSIS — N6091 Unspecified benign mammary dysplasia of right breast: Secondary | ICD-10-CM

## 2018-05-18 DIAGNOSIS — D0502 Lobular carcinoma in situ of left breast: Secondary | ICD-10-CM | POA: Diagnosis not present

## 2018-05-18 DIAGNOSIS — D493 Neoplasm of unspecified behavior of breast: Secondary | ICD-10-CM

## 2018-05-18 HISTORY — PX: BREAST EXCISIONAL BIOPSY: SUR124

## 2018-05-18 HISTORY — PX: BREAST BIOPSY: SHX20

## 2018-05-18 LAB — POCT PREGNANCY, URINE: Preg Test, Ur: NEGATIVE

## 2018-05-18 SURGERY — BREAST BIOPSY WITH NEEDLE LOCALIZATION
Anesthesia: General | Laterality: Bilateral | Wound class: Clean

## 2018-05-18 MED ORDER — ACETAMINOPHEN 10 MG/ML IV SOLN
INTRAVENOUS | Status: DC | PRN
  Administered 2018-05-18: 1000 mg via INTRAVENOUS

## 2018-05-18 MED ORDER — MIDAZOLAM HCL 2 MG/2ML IJ SOLN
INTRAMUSCULAR | Status: AC
  Filled 2018-05-18: qty 2

## 2018-05-18 MED ORDER — ONDANSETRON HCL 4 MG/2ML IJ SOLN
INTRAMUSCULAR | Status: AC
  Filled 2018-05-18: qty 2

## 2018-05-18 MED ORDER — CELECOXIB 200 MG PO CAPS
200.0000 mg | ORAL_CAPSULE | ORAL | Status: AC
  Administered 2018-05-18: 200 mg via ORAL

## 2018-05-18 MED ORDER — LACTATED RINGERS IV SOLN
INTRAVENOUS | Status: DC
  Administered 2018-05-18 (×3): via INTRAVENOUS

## 2018-05-18 MED ORDER — PROPOFOL 10 MG/ML IV BOLUS
INTRAVENOUS | Status: AC
  Filled 2018-05-18: qty 20

## 2018-05-18 MED ORDER — LIDOCAINE HCL (PF) 2 % IJ SOLN
INTRAMUSCULAR | Status: AC
  Filled 2018-05-18: qty 10

## 2018-05-18 MED ORDER — BUPIVACAINE-EPINEPHRINE 0.5% -1:200000 IJ SOLN
INTRAMUSCULAR | Status: DC | PRN
  Administered 2018-05-18: 15 mL

## 2018-05-18 MED ORDER — PROPOFOL 10 MG/ML IV BOLUS
INTRAVENOUS | Status: DC | PRN
  Administered 2018-05-18: 150 mg via INTRAVENOUS

## 2018-05-18 MED ORDER — GLYCOPYRROLATE 0.2 MG/ML IJ SOLN
INTRAMUSCULAR | Status: DC | PRN
  Administered 2018-05-18: 0.1 mg via INTRAVENOUS

## 2018-05-18 MED ORDER — OXYCODONE HCL 5 MG PO TABS: 5.0000 mg | ORAL_TABLET | Freq: Once | ORAL | Status: DC | PRN

## 2018-05-18 MED ORDER — DEXAMETHASONE SODIUM PHOSPHATE 10 MG/ML IJ SOLN
INTRAMUSCULAR | Status: DC | PRN
  Administered 2018-05-18: 10 mg via INTRAVENOUS

## 2018-05-18 MED ORDER — GABAPENTIN 300 MG PO CAPS
300.0000 mg | ORAL_CAPSULE | ORAL | Status: AC
  Administered 2018-05-18: 300 mg via ORAL

## 2018-05-18 MED ORDER — MIDAZOLAM HCL 2 MG/2ML IJ SOLN
INTRAMUSCULAR | Status: DC | PRN
  Administered 2018-05-18: 2 mg via INTRAVENOUS

## 2018-05-18 MED ORDER — FAMOTIDINE 20 MG PO TABS
ORAL_TABLET | ORAL | Status: AC
  Filled 2018-05-18: qty 1

## 2018-05-18 MED ORDER — EPHEDRINE SULFATE 50 MG/ML IJ SOLN
INTRAMUSCULAR | Status: AC
  Filled 2018-05-18: qty 1

## 2018-05-18 MED ORDER — LIDOCAINE HCL URETHRAL/MUCOSAL 2 % EX GEL
CUTANEOUS | Status: AC
  Filled 2018-05-18: qty 5

## 2018-05-18 MED ORDER — LIDOCAINE HCL (CARDIAC) PF 100 MG/5ML IV SOSY
PREFILLED_SYRINGE | INTRAVENOUS | Status: DC | PRN
  Administered 2018-05-18: 60 mg via INTRAVENOUS

## 2018-05-18 MED ORDER — FAMOTIDINE 20 MG PO TABS
20.0000 mg | ORAL_TABLET | Freq: Once | ORAL | Status: AC
  Administered 2018-05-18: 20 mg via ORAL

## 2018-05-18 MED ORDER — OXYCODONE HCL 5 MG/5ML PO SOLN: 5.0000 mg | Freq: Once | ORAL | Status: DC | PRN

## 2018-05-18 MED ORDER — BUPIVACAINE-EPINEPHRINE (PF) 0.5% -1:200000 IJ SOLN
INTRAMUSCULAR | Status: AC
  Filled 2018-05-18: qty 30

## 2018-05-18 MED ORDER — EPHEDRINE SULFATE 50 MG/ML IJ SOLN
INTRAMUSCULAR | Status: DC | PRN
  Administered 2018-05-18 (×2): 10 mg via INTRAVENOUS

## 2018-05-18 MED ORDER — HYDROMORPHONE HCL 1 MG/ML IJ SOLN
INTRAMUSCULAR | Status: DC | PRN
  Administered 2018-05-18: 1 mg via INTRAVENOUS

## 2018-05-18 MED ORDER — ACETAMINOPHEN 10 MG/ML IV SOLN
INTRAVENOUS | Status: AC
  Filled 2018-05-18: qty 100

## 2018-05-18 MED ORDER — MIDAZOLAM HCL 2 MG/2ML IJ SOLN
INTRAMUSCULAR | Status: AC
  Filled 2018-05-18: qty 4

## 2018-05-18 MED ORDER — GLYCOPYRROLATE 0.2 MG/ML IJ SOLN
INTRAMUSCULAR | Status: AC
  Filled 2018-05-18: qty 1

## 2018-05-18 MED ORDER — SEVOFLURANE IN SOLN
RESPIRATORY_TRACT | Status: AC
  Filled 2018-05-18: qty 250

## 2018-05-18 MED ORDER — DEXAMETHASONE SODIUM PHOSPHATE 10 MG/ML IJ SOLN
INTRAMUSCULAR | Status: AC
  Filled 2018-05-18: qty 1

## 2018-05-18 MED ORDER — GABAPENTIN 300 MG PO CAPS
ORAL_CAPSULE | ORAL | Status: AC
  Filled 2018-05-18: qty 1

## 2018-05-18 MED ORDER — HYDROMORPHONE HCL 1 MG/ML IJ SOLN
INTRAMUSCULAR | Status: AC
  Filled 2018-05-18: qty 1

## 2018-05-18 MED ORDER — FENTANYL CITRATE (PF) 100 MCG/2ML IJ SOLN: 25.0000 ug | INTRAMUSCULAR | Status: DC | PRN

## 2018-05-18 MED ORDER — ONDANSETRON HCL 4 MG/2ML IJ SOLN
INTRAMUSCULAR | Status: DC | PRN
  Administered 2018-05-18 (×2): 4 mg via INTRAVENOUS

## 2018-05-18 MED ORDER — HYDROCODONE-ACETAMINOPHEN 5-325 MG PO TABS: 1.0000 | ORAL_TABLET | ORAL | 0 refills | Status: DC | PRN

## 2018-05-18 MED ORDER — CELECOXIB 200 MG PO CAPS
ORAL_CAPSULE | ORAL | Status: AC
  Filled 2018-05-18: qty 1

## 2018-05-18 MED ORDER — PHENYLEPHRINE HCL 10 MG/ML IJ SOLN
INTRAMUSCULAR | Status: DC | PRN
  Administered 2018-05-18: 200 ug via INTRAVENOUS
  Administered 2018-05-18 (×3): 100 ug via INTRAVENOUS

## 2018-05-18 SURGICAL SUPPLY — 44 items
BINDER BREAST LRG (GAUZE/BANDAGES/DRESSINGS) ×3 IMPLANT
BINDER BREAST MEDIUM (GAUZE/BANDAGES/DRESSINGS) IMPLANT
BINDER BREAST XLRG (GAUZE/BANDAGES/DRESSINGS) IMPLANT
BINDER BREAST XXLRG (GAUZE/BANDAGES/DRESSINGS) IMPLANT
BLADE PHOTON ILLUMINATED (MISCELLANEOUS) ×3 IMPLANT
BLADE SURG 15 STRL SS SAFETY (BLADE) ×6 IMPLANT
CANISTER SUCT 1200ML W/VALVE (MISCELLANEOUS) ×3 IMPLANT
CHLORAPREP W/TINT 26ML (MISCELLANEOUS) ×6 IMPLANT
CLOSURE WOUND 1/2 X4 (GAUZE/BANDAGES/DRESSINGS) ×2
CNTNR SPEC 2.5X3XGRAD LEK (MISCELLANEOUS)
CONT SPEC 4OZ STER OR WHT (MISCELLANEOUS)
CONTAINER SPEC 2.5X3XGRAD LEK (MISCELLANEOUS) IMPLANT
COVER PROBE FLX POLY STRL (MISCELLANEOUS) IMPLANT
DEVICE DUBIN SPECIMEN MAMMOGRA (MISCELLANEOUS) ×6 IMPLANT
DRAPE CHEST BREAST 77X106 FENE (MISCELLANEOUS) ×3 IMPLANT
DRAPE LAPAROTOMY 100X77 ABD (DRAPES) ×3 IMPLANT
DRSG GAUZE FLUFF 36X18 (GAUZE/BANDAGES/DRESSINGS) ×3 IMPLANT
DRSG TELFA 4X3 1S NADH ST (GAUZE/BANDAGES/DRESSINGS) ×6 IMPLANT
ELECT CAUTERY BLADE TIP 2.5 (TIP) ×3
ELECT REM PT RETURN 9FT ADLT (ELECTROSURGICAL) ×3
ELECTRODE CAUTERY BLDE TIP 2.5 (TIP) ×1 IMPLANT
ELECTRODE REM PT RTRN 9FT ADLT (ELECTROSURGICAL) ×1 IMPLANT
GLOVE BIO SURGEON STRL SZ7.5 (GLOVE) ×9 IMPLANT
GLOVE INDICATOR 8.0 STRL GRN (GLOVE) ×3 IMPLANT
GOWN STRL REUS W/ TWL LRG LVL3 (GOWN DISPOSABLE) ×3 IMPLANT
GOWN STRL REUS W/TWL LRG LVL3 (GOWN DISPOSABLE) ×6
KIT TURNOVER KIT A (KITS) ×3 IMPLANT
LABEL OR SOLS (LABEL) ×3 IMPLANT
MARGIN MAP 10MM (MISCELLANEOUS) ×6 IMPLANT
NEEDLE HYPO 22GX1.5 SAFETY (NEEDLE) ×3 IMPLANT
NEEDLE HYPO 25X1 1.5 SAFETY (NEEDLE) ×3 IMPLANT
PACK BASIN MINOR ARMC (MISCELLANEOUS) ×3 IMPLANT
RETRACTOR RING XSMALL (MISCELLANEOUS) IMPLANT
RTRCTR WOUND ALEXIS 13CM XS SH (MISCELLANEOUS)
STRIP CLOSURE SKIN 1/2X4 (GAUZE/BANDAGES/DRESSINGS) ×4 IMPLANT
SUT ETHILON 3-0 FS-10 30 BLK (SUTURE) ×3
SUT VIC AB 2-0 CT1 27 (SUTURE) ×4
SUT VIC AB 2-0 CT1 TAPERPNT 27 (SUTURE) ×2 IMPLANT
SUT VIC AB 4-0 FS2 27 (SUTURE) ×3 IMPLANT
SUTURE EHLN 3-0 FS-10 30 BLK (SUTURE) ×1 IMPLANT
SWABSTK COMLB BENZOIN TINCTURE (MISCELLANEOUS) ×6 IMPLANT
SYR 10ML LL (SYRINGE) ×3 IMPLANT
TAPE TRANSPORE STRL 2 31045 (GAUZE/BANDAGES/DRESSINGS) ×3 IMPLANT
WATER STERILE IRR 1000ML POUR (IV SOLUTION) ×3 IMPLANT

## 2018-05-18 NOTE — Anesthesia Postprocedure Evaluation (Signed)
Anesthesia Post Note  Patient: Kendra Harrington  Procedure(s) Performed: BREAST BIOPSY WITH NEEDLE LOCALIZATION (Bilateral )  Patient location during evaluation: PACU Anesthesia Type: General Level of consciousness: awake and alert Pain management: pain level controlled Vital Signs Assessment: post-procedure vital signs reviewed and stable Respiratory status: spontaneous breathing and respiratory function stable Cardiovascular status: stable Anesthetic complications: no     Last Vitals:  Vitals:   05/18/18 1607 05/18/18 1630  BP:  (!) 167/91  Pulse:  73  Resp: 16 16  Temp: 36.7 C   SpO2: 93%     Last Pain:  Vitals:   05/18/18 1630  TempSrc:   PainSc: 1                  Tamie Minteer K

## 2018-05-18 NOTE — Discharge Instructions (Signed)

## 2018-05-18 NOTE — Anesthesia Preprocedure Evaluation (Addendum)
Anesthesia Evaluation  Patient identified by MRN, date of birth, ID band Patient awake    Reviewed: Allergy & Precautions, H&P , NPO status , Patient's Chart, lab work & pertinent test results  Airway Mallampati: III  TM Distance: >3 FB Neck ROM: full    Dental  (+) Teeth Intact   Pulmonary neg pulmonary ROS, Current Smoker,    breath sounds clear to auscultation       Cardiovascular hypertension, negative cardio ROS   Rhythm:regular Rate:Normal     Neuro/Psych negative neurological ROS  negative psych ROS   GI/Hepatic negative GI ROS, Neg liver ROS, GERD  ,  Endo/Other  negative endocrine ROS  Renal/GU negative Renal ROS  negative genitourinary   Musculoskeletal   Abdominal   Peds  Hematology negative hematology ROS (+)   Anesthesia Other Findings Past Medical History: 12/26/2017: BRCA negative     Comment:  [facility]  11/20/2017: Cancer ([place])     Comment:  DUCTAL CARCINOMA IN SITU left breast No date: GERD (gastroesophageal reflux disease)     Comment:  OCC-NO MEDS No date: Hypertension  Past Surgical History: 02/21/2014: BREAST BIOPSY; Right     Comment:  upper-outer calcs=atypical ductal hyperplasia and               background of columnar cell change w/ calcifications.               cytologic atypia per rad report from [place]. 02/21/2014: BREAST BIOPSY; Right     Comment:  Central Right calcs=atypical ductal hyperplasia with               atypical lobular hyperplasia in a background of columnar               cell change with calcifications and papillary apocrine               metaplasia per rad report from [place] 11/20/2017: BREAST BIOPSY; Left     Comment:  Affirm Bx- DUCTAL CARCINOMA IN SITU 11/20/2017: BREAST BIOPSY; Right     Comment:  Affirm Bx of 2 areas-  ATYPICAL DUCTAL HYPERPLASIA and               PSEUDO-ANGIOMATOUS STROMAL HYPERPLASIA 2015: BREAST EXCISIONAL BIOPSY; Right     Comment:   "Benign" per pt 05/18/2018: BREAST EXCISIONAL BIOPSY; Left     Comment:  NL for  lumpectomy - DCIS 05/18/2018: BREAST EXCISIONAL BIOPSY; Right     Comment:  NL for lumpectomy for 2 areas of atypical ductal               hyperplasia  No date: CESAREAN SECTION  BMI    Body Mass Index:  27.10 kg/m      Reproductive/Obstetrics negative OB ROS                           Anesthesia Physical Anesthesia Plan  ASA: II  Anesthesia Plan: General ETT   Post-op Pain Management:    Induction:   PONV Risk Score and Plan: Ondansetron and Dexamethasone  Airway Management Planned:   Additional Equipment:   Intra-op Plan:   Post-operative Plan:   Informed Consent: I have reviewed the patients History and Physical, chart, labs and discussed the procedure including the risks, benefits and alternatives for the proposed anesthesia with the patient or authorized representative who has indicated his/her understanding and acceptance.   Dental Advisory Given  Plan Discussed with: Anesthesiologist, CRNA and  Surgeon  Anesthesia Plan Comments:         Anesthesia Quick Evaluation

## 2018-05-18 NOTE — H&P (Signed)
No change in clinical history or exam.  For left breast wide excision, excision of extensive foci of microcalcifications in the right breast with ADH by previous biopsy.

## 2018-05-18 NOTE — Anesthesia Post-op Follow-up Note (Signed)
Anesthesia QCDR form completed.        

## 2018-05-18 NOTE — OR Nursing (Signed)
Discharge discussed with pt and husband. Both voice understanding.

## 2018-05-18 NOTE — Op Note (Signed)
Preoperative diagnosis 1): Multifocal atypical ductal hyperplasia of the right breast; 2) low-grade DCIS of the left breast.  Postoperative diagnosis: Same.  Operative procedure: 1) wire localization ultrasound-guided wide excision of the upper outer quadrant of the right breast for multifocal atypical ductal hyperplasia; 2) wire localization ultrasound-guided wide excision of a single foci of DCIS involving the central deep aspect of the left breast.  Operating Surgeon: Hervey Ard, MD.  Anesthesia: General by LMA, Marcaine 0.5% with 1 to 200,000 units of epinephrine: 30 cc.  Estimated blood loss: 30 cc.  Clinical note: This 47 year old woman has a history of prior biopsies of ADH involving the left breast in 2015.  Subsequent open excision showed no upstaging to DCIS.  There was a 4-year gap between 2015 and 2019 and recent mammogram showed extensive calcifications in the upper outer quadrant of the right breast which were biopsied and there are anterior and posterior extremes with findings of ADH but no DCIS or invasive cancer.  A foci of microcalcifications in the deep central left breast did confirm low-grade DCIS.  The patient underwent bracketing wires today from the subareolar area to the upper outer quadrant on the right and a single wire into the deep central portion of the left breast for excision of the above-mentioned lesions.  Operative note: The patient underwent general anesthesia without difficulty.  The breast chest and axilla was cleansed with ChloraPrep bilaterally.  Attention was turned to the right breast first.  Ultrasound was used to identify the tip of the wires to guide with the excision as there was 5-6 cm between the skin entry site and the area of clinical/radiologic concern.  It was found that the majority of the calcifications resided between the prior scar in the upper outer quadrant from her 2015 excisional biopsy the retroareolar area.  A curvilinear incision in  the upper outer quadrant of breast was chosen and the area infiltrated about 3 cm from the anticipated incision site with local anesthetic.  The skin was incised sharply and remaining dissection completed with electrocautery.  The adipose layer was divided and a extra small Alexis wound protector placed.  Both localizing wires were brought into the wound and a block of tissue measuring approximately 3 x 4 x 7 cm including both wires was excised and orientated.  The superior wire was tapped with the cautery with severing of the wire.  Specimen radiograph confirmed the tip of the wire was removed.  It was unclear in my review of the films at the ribbon clip which was immediately adjacent to the thick portion of the upper wire was included in the specimen although radiologically that portion of the breast was included in the specimen.  The inferior aspect with multiple clips and the wire tip was retrieved.  The specimen included the pectoralis fascia.  The breast was elevated off the underlying pectoralis fascia circumferentially and this was approximated with interrupted 2-0 Vicryl figure-of-eight sutures.  The superficial breast parenchyma was then approximated in a similar fashion with good restoration of volume.  The adipose layer was approximated with interrupted 2-0 Vicryl sutures and the skin closed with a running 4-0 Vicryl subcuticular suture.  Attention was turned to the left breast.  Ultrasound was used to identify the tip of the previously placed localizing wire essentially directly behind the nipple just above the level of the fascia.  Local anesthesia was infiltrated around a radial incision at the 4-5 o'clock position.  The incision went from the base of the  nipple for about 6 cm.  Skin was incised sharply and then the adipose layer elevated off the underlying breast parenchyma to allow placement of the [place] wound protector.  A 3 x 4 x 4 cm block of tissue was excised down to the pectoralis fascia  orientated and specimen radiograph confirmed the biopsy clip and tip of the wire in the center of the specimen. The pectoralis fascia was the posterior border of this resection.  The deep tissue was approximated with multiple layers of 2-0 Vicryl figure-of-eight sutures.  The adipose layer was approximated with interrupted 2-0 Vicryl sutures.  The skin was closed with 4-0 Vicryl subcuticular interrupted sutures under the areola and then a running suture along the skin in the lower outer quadrant.  Benzoin, Steri-Strips, Telfa, fluff gauze and a breast binder was applied.  Patient tolerated the procedure well was taken to recovery room in stable condition.

## 2018-05-18 NOTE — Transfer of Care (Signed)
Immediate Anesthesia Transfer of Care Note  Patient: Kendra Harrington  Procedure(s) Performed: BREAST BIOPSY WITH NEEDLE LOCALIZATION (Bilateral )  Patient Location: PACU  Anesthesia Type:General  Level of Consciousness: drowsy and patient cooperative  Airway & Oxygen Therapy: Patient Spontanous Breathing and Patient connected to face mask oxygen  Post-op Assessment: Report given to RN, Post -op Vital signs reviewed and stable and Patient moving all extremities  Post vital signs: Reviewed and stable  Last Vitals:  Vitals Value Taken Time  BP 109/81 05/18/2018  3:20 PM  Temp    Pulse 89 05/18/2018  3:21 PM  Resp 11 05/18/2018  3:21 PM  SpO2 99 % 05/18/2018  3:21 PM  Vitals shown include unvalidated device data.  Last Pain:  Vitals:   05/18/18 0859  TempSrc: Tympanic  PainSc: 0-No pain         Complications: No apparent anesthesia complications

## 2018-05-18 NOTE — Anesthesia Procedure Notes (Signed)
Procedure Name: LMA Insertion Date/Time: 05/18/2018 1:26 PM Performed by: Nile Riggs, CRNA Pre-anesthesia Checklist: Patient identified, Emergency Drugs available, Suction available, Patient being monitored and Timeout performed Patient Re-evaluated:Patient Re-evaluated prior to induction Oxygen Delivery Method: Circle system utilized Preoxygenation: Pre-oxygenation with 100% oxygen Induction Type: IV induction Ventilation: Mask ventilation without difficulty LMA: LMA inserted LMA Size: 3.0 Tube type: Oral Number of attempts: 1 Placement Confirmation: positive ETCO2,  CO2 detector and breath sounds checked- equal and bilateral Tube secured with: Tape Dental Injury: Teeth and Oropharynx as per pre-operative assessment  Comments: Atraumatic seating of AuraStraight LMA

## 2018-05-19 ENCOUNTER — Encounter: Payer: Self-pay | Admitting: General Surgery

## 2018-05-22 ENCOUNTER — Telehealth: Payer: Self-pay | Admitting: General Surgery

## 2018-05-22 NOTE — Telephone Encounter (Signed)
Message left that final path pending additional sections and will not be available until August 22.

## 2018-05-23 NOTE — Telephone Encounter (Signed)
Patient aware and is following up 05/24/18.

## 2018-05-24 ENCOUNTER — Encounter: Payer: Self-pay | Admitting: General Surgery

## 2018-05-24 ENCOUNTER — Ambulatory Visit (INDEPENDENT_AMBULATORY_CARE_PROVIDER_SITE_OTHER): Payer: BC Managed Care – PPO | Admitting: General Surgery

## 2018-05-24 VITALS — BP 138/82 | HR 84 | Resp 12 | Ht 64.0 in | Wt 151.0 lb

## 2018-05-24 DIAGNOSIS — Z170 Estrogen receptor positive status [ER+]: Secondary | ICD-10-CM

## 2018-05-24 DIAGNOSIS — C50411 Malignant neoplasm of upper-outer quadrant of right female breast: Secondary | ICD-10-CM

## 2018-05-24 DIAGNOSIS — D0512 Intraductal carcinoma in situ of left breast: Secondary | ICD-10-CM

## 2018-05-24 NOTE — Progress Notes (Signed)
Patient ID: Kendra Harrington, female   DOB: 11-18-1970, 47 y.o.   MRN: [id number]  Chief Complaint  Patient presents with  . Follow-up    HPI Kendra Harrington is a 47 y.o. female here today for her post op bilateral breast excsion done on 05/18/2018. Patient states she is doing well.  HPI  Past Medical History:  Diagnosis Date  . BRCA negative 12/26/2017   [facility]   . Cancer ([place]) 11/20/2017   DUCTAL CARCINOMA IN SITU left breast  . GERD (gastroesophageal reflux disease)    OCC-NO MEDS  . Hypertension     Past Surgical History:  Procedure Laterality Date  . BREAST BIOPSY Right 02/21/2014   upper-outer calcs=atypical ductal hyperplasia and background of columnar cell change w/ calcifications. cytologic atypia per rad report from [place].  Marland Kitchen BREAST BIOPSY Right 02/21/2014   Central Right calcs=atypical ductal hyperplasia with atypical lobular hyperplasia in a background of columnar cell change with calcifications and papillary apocrine metaplasia per rad report from [place]  . BREAST BIOPSY Left 11/20/2017   Affirm Bx- DUCTAL CARCINOMA IN SITU  . BREAST BIOPSY Right 11/20/2017   Affirm Bx of 2 areas-  ATYPICAL DUCTAL HYPERPLASIA and PSEUDO-ANGIOMATOUS STROMAL HYPERPLASIA  . BREAST BIOPSY Bilateral 05/18/2018   Procedure: BREAST BIOPSY WITH NEEDLE LOCALIZATION;  Surgeon: Robert Bellow, MD;  Location: [facility] ORS;  Service: General;  Laterality: Bilateral;  . BREAST EXCISIONAL BIOPSY Right 2015   "Benign" per pt  . BREAST EXCISIONAL BIOPSY Left 05/18/2018   NL for  lumpectomy - DCIS  . BREAST EXCISIONAL BIOPSY Right 05/18/2018   NL for lumpectomy for 2 areas of atypical ductal hyperplasia   . CESAREAN SECTION      Family History  Problem Relation Age of Onset  . Hypertension Mother   . Stroke Father   . Breast cancer Neg Hx     Social History Social History   Tobacco Use  . Smoking status: Current Some Day Smoker    Years: 7.00    Types: Cigarettes  . Smokeless tobacco:  Never Used  . Tobacco comment: Social  Substance Use Topics  . Alcohol use: Yes    Alcohol/week: 3.0 standard drinks    Types: 3 Glasses of wine per week  . Drug use: No    Allergies  Allergen Reactions  . Peanut-Containing Drug Products Anaphylaxis    Current Outpatient Medications  Medication Sig Dispense Refill  . amLODipine (NORVASC) 5 MG tablet Take 1 tablet (5 mg total) by mouth daily. (Patient taking differently: Take 5 mg by mouth every morning. ) 90 tablet 2  . Multiple Vitamin (MULTIVITAMIN) tablet Take 1 tablet by mouth daily.     No current facility-administered medications for this visit.     Review of Systems Review of Systems  Constitutional: Negative.   Respiratory: Negative.   Cardiovascular: Negative.     Blood pressure 138/82, pulse 84, resp. rate 12, height 5' 4"  (1.626 m), weight 151 lb (68.5 kg), last menstrual period 05/09/2018, SpO2 99 %.  Physical Exam Physical Exam  Constitutional: She is oriented to person, place, and time. She appears well-developed and well-nourished.  Cardiovascular: Normal rate, regular rhythm and normal heart sounds.  Pulmonary/Chest: Effort normal and breath sounds normal.  Right breast incision is clean and healing well.     Neurological: She is alert and oriented to person, place, and time.  Skin: Skin is warm and dry.    Data Reviewed A. BREAST, RIGHT UPPER OUTER QUADRANT; NEEDLE LOCALIZED WIDE  EXCISION:  - TWO FOCI OF INVASIVE MAMMARY CARCINOMA ARISING IN EXTENSIVE DUCTAL  CARCINOMA IN SITU.  - LOBULAR CARCINOMA IN SITU, FOCALLY PLEOMORPHIC.  - FLAT EPITHELIAL ATYPIA AND ATYPICAL DUCTAL HYPERPLASIA.  - SEE CANCER SUMMARY BELOW AND COMMENT.  - CYSTIC PAPILLARY APOCRINE METAPLASIA, AND PSEUDOANGIOMATOUS STROMAL  HYPERPLASIA.  Tumor Size: Largest invasive focus measures 3 mm  B. BREAST, LEFT RETROAREOLAR; NEEDLE LOCALIZED WIDE EXCISION:  - DUCTAL CARCINOMA IN SITU.  - LOBULAR CARCINOMA IN SITU, FOCALLY  PLEOMORPHIC.  - FLAT EPITHELIAL ATYPIA AND ATYPICAL DUCTAL HYPERPLASIA.  - SEE CANCER SUMMARY BELOW AND COMMENT.  - CYSTIC PAPILLARY APOCRINE METAPLASIA, PSEUDOANGIOMATOUS STROMAL  HYPERPLASIA, INTRADUCTAL PAPILLOMA, AND DUCT ECTASIA.  Size (Extent) of DCIS: at least 36 mm   Assessment    Upstaging to invasive cancer on the right, extensive DCIS on the left.    Plan  The patient has had the maximum volume of tissue removed on the right that possible without providing severe distortion.  I think with the finding of diffuse areas of DCIS, 2 foci of invasive cancer as well as lobular carcinoma in situ with pleomorphic features she would be best served at a minimum by mastectomy on the right and reexcision on the left.  Considering her breast volume and the extensive nature of the CIS on the left side, she probably would be best served by bilateral mastectomy.  I have suggested that she meet with plastic surgery for their input on options for reconstruction.  The patient is amenable to this.  The patient is aware to call back for any questions or concerns.   Kendra Harrington 05/25/2018, 5:04 PM

## 2018-05-24 NOTE — Patient Instructions (Signed)
Patient need to see DR. Dillingham. The patient is aware to call back for any questions or concerns.

## 2018-05-25 LAB — SURGICAL PATHOLOGY

## 2018-05-29 ENCOUNTER — Telehealth: Payer: Self-pay

## 2018-05-29 NOTE — Telephone Encounter (Signed)
Patient has been referred to Dr Marla Roe. Her schedule does not open until October. The office will be contacting the patient to schedule an appointment once her schedule has opened.

## 2018-06-13 ENCOUNTER — Encounter: Payer: BC Managed Care – PPO | Admitting: Family Medicine

## 2018-07-02 ENCOUNTER — Telehealth: Payer: Self-pay

## 2018-07-02 ENCOUNTER — Other Ambulatory Visit: Payer: Self-pay

## 2018-07-02 DIAGNOSIS — C50411 Malignant neoplasm of upper-outer quadrant of right female breast: Secondary | ICD-10-CM

## 2018-07-02 DIAGNOSIS — D0512 Intraductal carcinoma in situ of left breast: Secondary | ICD-10-CM

## 2018-07-02 DIAGNOSIS — Z170 Estrogen receptor positive status [ER+]: Principal | ICD-10-CM

## 2018-07-02 NOTE — Telephone Encounter (Signed)
Patient called back and she does want to see Dr Marla Roe for her reconstruction. Referral has been placed and they will contact the patient.

## 2018-07-02 NOTE — Telephone Encounter (Signed)
Message left for patient to call back. Would like to see if she still wants referral to Dr Audelia Hives for reconstruction or if she will be staying with [facility].

## 2018-07-08 ENCOUNTER — Other Ambulatory Visit: Payer: Self-pay | Admitting: Family Medicine

## 2018-07-09 NOTE — Telephone Encounter (Signed)
Requested medication (s) are due for refill today: Yes  Requested medication (s) are on the active medication list: Yes  Last refill:  10/18/17  Future visit scheduled: No  Notes to clinic:  Unable to refill due to no appointment in last 6 months per protocol.     Requested Prescriptions  Pending Prescriptions Disp Refills   amLODipine (NORVASC) 5 MG tablet [Pharmacy Med Name: AMLODIPINE BESYLATE 5 MG TAB] 90 tablet 2    Sig: TAKE 1 TABLET BY MOUTH EVERY DAY     Cardiovascular:  Calcium Channel Blockers Failed - 07/08/2018  9:14 AM      Failed - Valid encounter within last 6 months    Recent Outpatient Visits          7 months ago Ductal carcinoma in situ (DCIS) of left breast   [facility] Kathrine Haddock, NP   8 months ago Essential hypertension   [place], [place], [place]             Passed - Last BP in normal range    BP Readings from Last 1 Encounters:  05/24/18 138/82

## 2018-07-11 ENCOUNTER — Encounter: Payer: Self-pay | Admitting: Plastic Surgery

## 2018-07-11 ENCOUNTER — Ambulatory Visit (INDEPENDENT_AMBULATORY_CARE_PROVIDER_SITE_OTHER): Payer: BC Managed Care – PPO | Admitting: Plastic Surgery

## 2018-07-11 VITALS — BP 126/80 | HR 66 | Resp 13 | Ht 64.0 in | Wt 150.0 lb

## 2018-07-11 DIAGNOSIS — C50411 Malignant neoplasm of upper-outer quadrant of right female breast: Secondary | ICD-10-CM

## 2018-07-11 DIAGNOSIS — D0512 Intraductal carcinoma in situ of left breast: Secondary | ICD-10-CM

## 2018-07-11 DIAGNOSIS — Z170 Estrogen receptor positive status [ER+]: Secondary | ICD-10-CM | POA: Diagnosis not present

## 2018-07-11 DIAGNOSIS — N6091 Unspecified benign mammary dysplasia of right breast: Secondary | ICD-10-CM | POA: Diagnosis not present

## 2018-07-11 NOTE — H&P (View-Only) (Signed)
Patient ID: Cassandra Tran, female    DOB: 11-27-1970, 47 y.o.   MRN: [id number]   Chief Complaint  Patient presents with  . Breast Cancer    The patient is a 47 yrs old bf here with her husband for consultation for breast reconstruction.  She was found to have an abnormal mammogram in January 2019.  She went on for mammogram and stereotactic biopsies which showed RIGHT upper outer quadrant atypical ductal hyperplasia, ER / PR positive and LEFT lower quadrant DCIS and lobular carcinoma in-site with pagetoid extension of duct. She underwent a wire localization ultrasound-guided excision of the UOQ of the right breast and the central deep aspect of the left breast.  Pathology was consistent with Invasive breast cancer.  The patient states her genetics was negative.  She is 5 feet 4 inches tall and weight is 150 pounds.  Preop bra is ~ 36 B/C cup.  She would like to be around the same size.   Review of Systems  Constitutional: Negative.  Negative for activity change and appetite change.  HENT: Negative.  Negative for facial swelling.   Eyes: Negative.   Respiratory: Negative.  Negative for chest tightness and wheezing.   Cardiovascular: Negative.   Gastrointestinal: Negative.   Endocrine: Negative.   Genitourinary: Negative.   Musculoskeletal: Negative.   Skin: Negative.  Negative for color change, rash and wound.  Neurological: Negative.   Hematological: Negative.   Psychiatric/Behavioral: Negative.     Past Medical History:  Diagnosis Date  . BRCA negative 12/26/2017   [facility]   . Cancer ([place]) 11/20/2017   DUCTAL CARCINOMA IN SITU left breast  . GERD (gastroesophageal reflux disease)    OCC-NO MEDS  . Hypertension     Past Surgical History:  Procedure Laterality Date  . BREAST BIOPSY Right 02/21/2014   upper-outer calcs=atypical ductal hyperplasia and background of columnar cell change w/ calcifications. cytologic atypia per rad report from [place].  Marland Kitchen BREAST BIOPSY Right  02/21/2014   Central Right calcs=atypical ductal hyperplasia with atypical lobular hyperplasia in a background of columnar cell change with calcifications and papillary apocrine metaplasia per rad report from [place]  . BREAST BIOPSY Left 11/20/2017   Affirm Bx- DUCTAL CARCINOMA IN SITU  . BREAST BIOPSY Right 11/20/2017   Affirm Bx of 2 areas-  ATYPICAL DUCTAL HYPERPLASIA and PSEUDO-ANGIOMATOUS STROMAL HYPERPLASIA  . BREAST BIOPSY Bilateral 05/18/2018   Procedure: BREAST BIOPSY WITH NEEDLE LOCALIZATION;  Surgeon: Robert Bellow, MD;  Location: [facility] ORS;  Service: General;  Laterality: Bilateral;  . BREAST EXCISIONAL BIOPSY Right 2015   "Benign" per pt  . BREAST EXCISIONAL BIOPSY Left 05/18/2018   NL for  lumpectomy - DCIS  . BREAST EXCISIONAL BIOPSY Right 05/18/2018   NL for lumpectomy for 2 areas of atypical ductal hyperplasia   . CESAREAN SECTION        Current Outpatient Medications:  .  amLODipine (NORVASC) 5 MG tablet, TAKE 1 TABLET BY MOUTH EVERY DAY, Disp: 15 tablet, Rfl: 0 .  Multiple Vitamin (MULTIVITAMIN) tablet, Take 1 tablet by mouth daily., Disp: , Rfl:    Objective:   Vitals:   07/11/18 0933  BP: 126/80  Pulse: 66  Resp: 13  SpO2: 100%    Physical Exam  Constitutional: She is oriented to person, place, and time. She appears well-developed and well-nourished.  HENT:  Head: Normocephalic and atraumatic.  Eyes: Pupils are equal, round, and reactive to light. EOM are normal.  Neck: Normal range of  motion.  Cardiovascular: Normal rate and regular rhythm.  Pulmonary/Chest: No respiratory distress.    Abdominal: Soft. She exhibits no distension.  Musculoskeletal: She exhibits no edema or tenderness.  Neurological: She is oriented to person, place, and time.  Skin: Skin is warm.  Psychiatric: She has a normal mood and affect. Her behavior is normal. Judgment and thought content normal.    Assessment & Plan:  Ductal carcinoma in situ (DCIS) of left  breast  Atypical ductal hyperplasia of right breast  Malignant neoplasm of upper-outer quadrant of right breast in female, estrogen receptor positive (HCC)  Assessment and Plan:  A long, detailed conversation was had regarding the patient's options for breast reconstruction. Five main points, which are explained to all breast reconstruction patients, were discussed.  1. Breast reconstruction is an optional process.  2. Breast reconstruction is a multi-stage process which involves multiple surgeries spaced several months apart. The entire process can take over one year.  3. The major goal of breast reconstruction is to have the patient look normal in clothing. When naked, there will always be scars.  4. Asymmetries are often present during the reconstruction process. Several operations may be needed, including surgery to the non-cancerous breast, to achieve satisfactory results.  5. No matter the reconstructive method, there are ways that the reconstruction can fail and a secondary reconstructive plan would need to be created.   A general discussion regarding all available methods of breast reconstruction were discussed. The types of reconstructions described included.  1. Tissue expander and implant based reconstruction, both single and multi-stage approaches.  2. Autologous only reconstructions, including free abdominal-tissue based reconstructions.  3. Combination procedures, particularly latissismus dorsi flaps combined with either expanders or implants.  For each of the reconstruction methods mentioned above, the risks, benefits, alternatives, scarring, and recovery time were discussed in great detail. Specific risks detailed included bleeding, infection, hematoma, seroma, scarring, pain, wound healing complications, flap loss, fat necrosis, capsular contracture, need for implant removal, donor site complications, bulge, hernia, umbilical necrosis, need for urgent reoperation, and need for  dressing changes were discussed.   Assessment  Once all reconstruction options were presented, a focused discussion was had regarding the patient's suitability for each of these procedures.  A total of 50 minutes of face-to-face time was spent in this encounter, of which >50% was spent in counseling.  The patient would like to have bilateral mastectomies and understands the NAC won't be saved.  She will have immediate reconstruction with expander and Flex HD placement.  This will required 1 - 2 nights in the hospital.  She will get expanded over the following 3 months.  The exchange to implants will then be done as an outpatient.  If radiation is required the time line and plan will be altered.  I spoke with Dr. Bary Castilla.    [place], DO

## 2018-07-11 NOTE — Progress Notes (Signed)
Patient ID: Kendra Harrington, female    DOB: 1971/08/21, 47 y.o.   MRN: [id number]   Chief Complaint  Patient presents with  . Breast Cancer    The patient is a 47 yrs old bf here with her husband for consultation for breast reconstruction.  She was found to have an abnormal mammogram in January 2019.  She went on for mammogram and stereotactic biopsies which showed RIGHT upper outer quadrant atypical ductal hyperplasia, ER / PR positive and LEFT lower quadrant DCIS and lobular carcinoma in-site with pagetoid extension of duct. She underwent a wire localization ultrasound-guided excision of the UOQ of the right breast and the central deep aspect of the left breast.  Pathology was consistent with Invasive breast cancer.  The patient states her genetics was negative.  She is 5 feet 4 inches tall and weight is 150 pounds.  Preop bra is ~ 36 B/C cup.  She would like to be around the same size.   Review of Systems  Constitutional: Negative.  Negative for activity change and appetite change.  HENT: Negative.  Negative for facial swelling.   Eyes: Negative.   Respiratory: Negative.  Negative for chest tightness and wheezing.   Cardiovascular: Negative.   Gastrointestinal: Negative.   Endocrine: Negative.   Genitourinary: Negative.   Musculoskeletal: Negative.   Skin: Negative.  Negative for color change, rash and wound.  Neurological: Negative.   Hematological: Negative.   Psychiatric/Behavioral: Negative.     Past Medical History:  Diagnosis Date  . BRCA negative 12/26/2017   [facility]   . Cancer ([place]) 11/20/2017   DUCTAL CARCINOMA IN SITU left breast  . GERD (gastroesophageal reflux disease)    OCC-NO MEDS  . Hypertension     Past Surgical History:  Procedure Laterality Date  . BREAST BIOPSY Right 02/21/2014   upper-outer calcs=atypical ductal hyperplasia and background of columnar cell change w/ calcifications. cytologic atypia per rad report from [place].  Marland Kitchen BREAST BIOPSY Right  02/21/2014   Central Right calcs=atypical ductal hyperplasia with atypical lobular hyperplasia in a background of columnar cell change with calcifications and papillary apocrine metaplasia per rad report from [place]  . BREAST BIOPSY Left 11/20/2017   Affirm Bx- DUCTAL CARCINOMA IN SITU  . BREAST BIOPSY Right 11/20/2017   Affirm Bx of 2 areas-  ATYPICAL DUCTAL HYPERPLASIA and PSEUDO-ANGIOMATOUS STROMAL HYPERPLASIA  . BREAST BIOPSY Bilateral 05/18/2018   Procedure: BREAST BIOPSY WITH NEEDLE LOCALIZATION;  Surgeon: Robert Bellow, MD;  Location: [facility] ORS;  Service: General;  Laterality: Bilateral;  . BREAST EXCISIONAL BIOPSY Right 2015   "Benign" per pt  . BREAST EXCISIONAL BIOPSY Left 05/18/2018   NL for  lumpectomy - DCIS  . BREAST EXCISIONAL BIOPSY Right 05/18/2018   NL for lumpectomy for 2 areas of atypical ductal hyperplasia   . CESAREAN SECTION        Current Outpatient Medications:  .  amLODipine (NORVASC) 5 MG tablet, TAKE 1 TABLET BY MOUTH EVERY DAY, Disp: 15 tablet, Rfl: 0 .  Multiple Vitamin (MULTIVITAMIN) tablet, Take 1 tablet by mouth daily., Disp: , Rfl:    Objective:   Vitals:   07/11/18 0933  BP: 126/80  Pulse: 66  Resp: 13  SpO2: 100%    Physical Exam  Constitutional: She is oriented to person, place, and time. She appears well-developed and well-nourished.  HENT:  Head: Normocephalic and atraumatic.  Eyes: Pupils are equal, round, and reactive to light. EOM are normal.  Neck: Normal range of  motion.  Cardiovascular: Normal rate and regular rhythm.  Pulmonary/Chest: No respiratory distress.    Abdominal: Soft. She exhibits no distension.  Musculoskeletal: She exhibits no edema or tenderness.  Neurological: She is oriented to person, place, and time.  Skin: Skin is warm.  Psychiatric: She has a normal mood and affect. Her behavior is normal. Judgment and thought content normal.    Assessment & Plan:  Ductal carcinoma in situ (DCIS) of left  breast  Atypical ductal hyperplasia of right breast  Malignant neoplasm of upper-outer quadrant of right breast in female, estrogen receptor positive (HCC)  Assessment and Plan:  A long, detailed conversation was had regarding the patient's options for breast reconstruction. Five main points, which are explained to all breast reconstruction patients, were discussed.  1. Breast reconstruction is an optional process.  2. Breast reconstruction is a multi-stage process which involves multiple surgeries spaced several months apart. The entire process can take over one year.  3. The major goal of breast reconstruction is to have the patient look normal in clothing. When naked, there will always be scars.  4. Asymmetries are often present during the reconstruction process. Several operations may be needed, including surgery to the non-cancerous breast, to achieve satisfactory results.  5. No matter the reconstructive method, there are ways that the reconstruction can fail and a secondary reconstructive plan would need to be created.   A general discussion regarding all available methods of breast reconstruction were discussed. The types of reconstructions described included.  1. Tissue expander and implant based reconstruction, both single and multi-stage approaches.  2. Autologous only reconstructions, including free abdominal-tissue based reconstructions.  3. Combination procedures, particularly latissismus dorsi flaps combined with either expanders or implants.  For each of the reconstruction methods mentioned above, the risks, benefits, alternatives, scarring, and recovery time were discussed in great detail. Specific risks detailed included bleeding, infection, hematoma, seroma, scarring, pain, wound healing complications, flap loss, fat necrosis, capsular contracture, need for implant removal, donor site complications, bulge, hernia, umbilical necrosis, need for urgent reoperation, and need for  dressing changes were discussed.   Assessment  Once all reconstruction options were presented, a focused discussion was had regarding the patient's suitability for each of these procedures.  A total of 50 minutes of face-to-face time was spent in this encounter, of which >50% was spent in counseling.  The patient would like to have bilateral mastectomies and understands the NAC won't be saved.  She will have immediate reconstruction with expander and Flex HD placement.  This will required 1 - 2 nights in the hospital.  She will get expanded over the following 3 months.  The exchange to implants will then be done as an outpatient.  If radiation is required the time line and plan will be altered.  I spoke with Dr. Bary Castilla.    [place], DO

## 2018-07-20 ENCOUNTER — Other Ambulatory Visit: Payer: Self-pay | Admitting: Family Medicine

## 2018-07-20 NOTE — Telephone Encounter (Signed)
Telephone call to patient.  Informed her for the need for an appointment.  Scheduled an appointment for Tuesday 07/24/18  With Merrie Roof, PA for F/U.  Patient voiced understanding.

## 2018-07-23 ENCOUNTER — Telehealth: Payer: Self-pay | Admitting: Plastic Surgery

## 2018-07-23 NOTE — Telephone Encounter (Signed)
Patient calling to inquire about when her surgery would be scheduled.

## 2018-07-24 ENCOUNTER — Ambulatory Visit: Payer: Self-pay | Admitting: Family Medicine

## 2018-07-25 ENCOUNTER — Telehealth: Payer: Self-pay

## 2018-07-25 MED ORDER — LIDOCAINE-PRILOCAINE 2.5-2.5 % EX CREA: 1.0000 "application " | TOPICAL_CREAM | CUTANEOUS | 0 refills | Status: DC | PRN

## 2018-07-25 NOTE — Telephone Encounter (Signed)
Spoke with patient about having surgery with Dr Marla Roe and Dr Bary Castilla for bilateral mastectomy with reconstruction. She is scheduled for surgery at [facility] on 08/08/18. She will pre admit by phone. She will be seen in office by Dr Bary Castilla prior to surgery. We will call her with her surgery day arrival time and location. EMLA cream prescription has been sent into the patient's pharmacy. She will apply this to both areolas and cover with plastic wrap one hour prior to leaving home for surgery.  The patient is aware of date and instructions.

## 2018-07-26 ENCOUNTER — Other Ambulatory Visit: Payer: Self-pay

## 2018-07-26 ENCOUNTER — Other Ambulatory Visit: Payer: Self-pay | Admitting: General Surgery

## 2018-07-26 ENCOUNTER — Telehealth: Payer: Self-pay

## 2018-07-26 DIAGNOSIS — C50411 Malignant neoplasm of upper-outer quadrant of right female breast: Secondary | ICD-10-CM

## 2018-07-26 DIAGNOSIS — D0512 Intraductal carcinoma in situ of left breast: Secondary | ICD-10-CM

## 2018-07-26 DIAGNOSIS — Z170 Estrogen receptor positive status [ER+]: Principal | ICD-10-CM

## 2018-07-26 NOTE — Telephone Encounter (Signed)
The patient will report to the Radiology desk in the [place] at [facility] on 08/08/18 at 10:15 am the day of surgery. The patient is aware of date, time, and instructions.

## 2018-07-31 ENCOUNTER — Ambulatory Visit: Payer: BC Managed Care – PPO | Admitting: Family Medicine

## 2018-07-31 ENCOUNTER — Encounter: Payer: Self-pay | Admitting: Family Medicine

## 2018-07-31 VITALS — BP 124/80 | HR 67 | Temp 97.9°F | Ht 64.5 in | Wt 157.9 lb

## 2018-07-31 DIAGNOSIS — N951 Menopausal and female climacteric states: Secondary | ICD-10-CM

## 2018-07-31 DIAGNOSIS — R21 Rash and other nonspecific skin eruption: Secondary | ICD-10-CM

## 2018-07-31 DIAGNOSIS — Z23 Encounter for immunization: Secondary | ICD-10-CM

## 2018-07-31 DIAGNOSIS — I10 Essential (primary) hypertension: Secondary | ICD-10-CM

## 2018-07-31 MED ORDER — KETOCONAZOLE 2 % EX CREA: 1.0000 "application " | TOPICAL_CREAM | Freq: Every day | CUTANEOUS | 0 refills | Status: DC

## 2018-07-31 MED ORDER — AMLODIPINE BESYLATE 5 MG PO TABS
5.0000 mg | ORAL_TABLET | Freq: Every day | ORAL | 1 refills | Status: DC
Start: 2018-07-31 — End: 2019-02-16

## 2018-07-31 NOTE — Progress Notes (Signed)
BP 124/80 (BP Location: Left Arm, Patient Position: Sitting, Cuff Size: Normal)   Pulse 67   Temp 97.9 F (36.6 C) (Oral)   Ht 5' 4.5" (1.638 m)   Wt 157 lb 14.4 oz (71.6 kg)   LMP 07/29/2018 (Approximate)   SpO2 100%   BMI 26.68 kg/m    Subjective:    Patient ID: Kendra Harrington, female    DOB: [birth year], 47 y.o.   MRN: [id number]  HPI: Kendra Harrington is a 47 y.o. female  Chief Complaint  Patient presents with  . Hypertension   Here today for htn f/u. Taking 5 mg amlodipine faithfully wihtout side effects. 120s/80s at home when checked. Denies CP, SOB, HAs, dizziness. Trying to stay active and eat well.   Two isolated light circles, one on right arm and one on left mid-back. Not really itchy or painful. No new products, foods, medications. Has not tried anything on spots.   Wanting to know if she's still able to get pregnant. Not currently on any contraception. Having regular periods.   Past Medical History:  Diagnosis Date  . BRCA negative 12/26/2017   [facility]   . Cancer ([place]) 11/20/2017   DUCTAL CARCINOMA IN SITU left breast  . GERD (gastroesophageal reflux disease)    OCC-NO MEDS  . Hypertension    Social History   Socioeconomic History  . Marital status: Married    Spouse name: Not on file  . Number of children: Not on file  . Years of education: Not on file  . Highest education level: Not on file  Occupational History  . Not on file  Social Needs  . Financial resource strain: Not on file  . Food insecurity:    Worry: Not on file    Inability: Not on file  . Transportation needs:    Medical: Not on file    Non-medical: Not on file  Tobacco Use  . Smoking status: Current Some Day Smoker    Years: 7.00    Types: Cigars  . Smokeless tobacco: Never Used  . Tobacco comment: Social  Substance and Sexual Activity  . Alcohol use: Yes    Alcohol/week: 3.0 standard drinks    Types: 3 Glasses of wine per week  . Drug use: No  . Sexual activity: Yes    Lifestyle  . Physical activity:    Days per week: Not on file    Minutes per session: Not on file  . Stress: Not on file  Relationships  . Social connections:    Talks on phone: Not on file    Gets together: Not on file    Attends religious service: Not on file    Active member of club or organization: Not on file    Attends meetings of clubs or organizations: Not on file    Relationship status: Not on file  . Intimate partner violence:    Fear of current or ex partner: Not on file    Emotionally abused: Not on file    Physically abused: Not on file    Forced sexual activity: Not on file  Other Topics Concern  . Not on file  Social History Narrative  . Not on file   Relevant past medical, surgical, family and social history reviewed and updated as indicated. Interim medical history since our last visit reviewed. Allergies and medications reviewed and updated.  Review of Systems  Per HPI unless specifically indicated above     Objective:    BP 124/80 (  BP Location: Left Arm, Patient Position: Sitting, Cuff Size: Normal)   Pulse 67   Temp 97.9 F (36.6 C) (Oral)   Ht 5' 4.5" (1.638 m)   Wt 157 lb 14.4 oz (71.6 kg)   LMP 07/29/2018 (Approximate)   SpO2 100%   BMI 26.68 kg/m   Wt Readings from Last 3 Encounters:  07/31/18 157 lb 14.4 oz (71.6 kg)  07/11/18 150 lb (68 kg)  05/24/18 151 lb (68.5 kg)    Physical Exam  Constitutional: She is oriented to person, place, and time. She appears well-developed and well-nourished. No distress.  HENT:  Head: Atraumatic.  Eyes: Conjunctivae and EOM are normal.  Neck: Normal range of motion. Neck supple.  Cardiovascular: Normal rate, regular rhythm and normal heart sounds.  Pulmonary/Chest: Effort normal and breath sounds normal.  Musculoskeletal: Normal range of motion.  Neurological: She is alert and oriented to person, place, and time.  Skin: Skin is warm and dry. Rash (two isolated hypopigmented macules that are about 3-4  cm in diameter) noted.  Psychiatric: She has a normal mood and affect. Her behavior is normal.  Nursing note and vitals reviewed.   Results for orders placed or performed in visit on 85/92/92  Basic Metabolic Panel (BMET)  Result Value Ref Range   Glucose 86 65 - 99 mg/dL   BUN 17 6 - 24 mg/dL   Creatinine, Ser 1.01 (H) 0.57 - 1.00 mg/dL   GFR calc non Af Amer 66 >59 mL/min/1.73   GFR calc Af Amer 76 >59 mL/min/1.73   BUN/Creatinine Ratio 17 9 - 23   Sodium 142 134 - 144 mmol/L   Potassium 4.4 3.5 - 5.2 mmol/L   Chloride 104 96 - 106 mmol/L   CO2 24 20 - 29 mmol/L   Calcium 9.3 8.7 - 10.2 mg/dL  Estrogens, total  Result Value Ref Range   Estrogen WILL FOLLOW   FSH  Result Value Ref Range   FSH 44.5 mIU/mL      Assessment & Plan:   Problem List Items Addressed This Visit      Cardiovascular and Mediastinum   Essential hypertension - Primary    Stable and WNL, continue current regimen      Relevant Medications   amLODipine (NORVASC) 5 MG tablet   Other Relevant Orders   Basic Metabolic Panel (BMET) (Completed)    Other Visit Diagnoses    Peri-menopause       Will perform labs to see where she's at generally with menopause. Discussed that contraception should still be used since she's still regularly menstruating   Relevant Orders   Estrogens, total (Completed)   [place] (Completed)   Need for influenza vaccination       Relevant Orders   Flu Vaccine QUAD 6+ mos PF IM (Fluarix Quad PF) (Completed)   Rash       possibly tinea rash, trial ketoconazole cream. F/u if not improving       Follow up plan: Return in about 6 months (around 01/30/2019) for CPE.

## 2018-07-31 NOTE — Patient Instructions (Signed)

## 2018-08-01 ENCOUNTER — Encounter
Admission: RE | Admit: 2018-08-01 | Discharge: 2018-08-01 | Disposition: A | Discharge status: Home / Self Care | Payer: BC Managed Care – PPO | Source: Ambulatory Visit | Attending: General Surgery | Admitting: General Surgery

## 2018-08-01 ENCOUNTER — Other Ambulatory Visit: Payer: Self-pay

## 2018-08-01 ENCOUNTER — Other Ambulatory Visit: Payer: BC Managed Care – PPO

## 2018-08-01 NOTE — Patient Instructions (Signed)
Your procedure is scheduled on: 08-08-18 Report to RADIOLOGY DESK AT 10 AM PER PT  Remember: Instructions that are not followed completely may result in serious medical risk, up to and including death, or upon the discretion of your surgeon and anesthesiologist your surgery may need to be rescheduled.    _x___ 1. Do not eat food after midnight the night before your procedure. You may drink clear liquids up to 2 hours before you are scheduled to arrive at the hospital for your procedure.  Do not drink clear liquids within 2 hours of your scheduled arrival to the hospital.  Clear liquids include  --Water or Apple juice without pulp  --Clear carbohydrate beverage such as ClearFast or Gatorade  --Black Coffee or Clear Tea (No milk, no creamers, do not add anything to the coffee or Tea   ____Ensure clear carbohydrate drink on the way to the hospital for bariatric patients  ____Ensure clear carbohydrate drink 3 hours before surgery for Dr Dwyane Luo patients if physician instructed.   No gum chewing or hard candies.     __x__ 2. No Alcohol for 24 hours before or after surgery.   __x__3. No Smoking or e-cigarettes for 24 prior to surgery.  Do not use any chewable tobacco products for at least 6 hour prior to surgery   ____  4. Bring all medications with you on the day of surgery if instructed.    __x__ 5. Notify your doctor if there is any change in your medical condition     (cold, fever, infections).    x___6. On the morning of surgery brush your teeth with toothpaste and water.  You may rinse your mouth with mouth wash if you wish.  Do not swallow any toothpaste or mouthwash.   Do not wear jewelry, make-up, hairpins, clips or nail polish.  Do not wear lotions, powders, or perfumes. You may wear deodorant.  Do not shave 48 hours prior to surgery. Men may shave face and neck.  Do not bring valuables to the hospital.    [facility] is not responsible for any belongings or valuables.            Contacts, dentures or bridgework may not be worn into surgery.  Leave your suitcase in the car. After surgery it may be brought to your room.  For patients admitted to the hospital, discharge time is determined by your treatment team.  _  Patients discharged the day of surgery will not be allowed to drive home.  You will need someone to drive you home and stay with you the night of your procedure.    Please read over the following fact sheets that you were given:   [facility] Preparing for Surgery   _x___ TAKE THE FOLLOWING MEDICATION THE MORNING OF SURGERY WITH A SMALL SIP OF WATER. These include:  1. AMLODIPINE  2.  3.  4.  5.  6.  ____Fleets enema or Magnesium Citrate as directed.   ____ Use CHG Soap or sage wipes as directed on instruction sheet   ____ Use inhalers on the day of surgery and bring to hospital day of surgery  ____ Stop Metformin and Janumet 2 days prior to surgery.    ____ Take 1/2 of usual insulin dose the night before surgery and none on the morning     surgery.   ____ Follow recommendations from Cardiologist, Pulmonologist or PCP regarding          stopping Aspirin, Coumadin, Plavix ,Eliquis, Effient,  or Pradaxa, and Pletal.  ____Stop Anti-inflammatories such as Advil, Aleve, Ibuprofen, Motrin, Naproxen, Naprosyn, Goodies powders or aspirin products-OK to take Tylenol    ____ Stop supplements until after surgery.     ____ Bring C-Pap to the hospital.

## 2018-08-02 ENCOUNTER — Other Ambulatory Visit: Payer: Self-pay

## 2018-08-02 ENCOUNTER — Encounter: Payer: Self-pay | Admitting: General Surgery

## 2018-08-02 ENCOUNTER — Ambulatory Visit: Payer: BC Managed Care – PPO | Admitting: General Surgery

## 2018-08-02 ENCOUNTER — Telehealth: Payer: Self-pay | Admitting: Plastic Surgery

## 2018-08-02 VITALS — BP 138/91 | HR 90 | Temp 97.8°F | Ht 64.5 in | Wt 154.0 lb

## 2018-08-02 DIAGNOSIS — Z170 Estrogen receptor positive status [ER+]: Secondary | ICD-10-CM

## 2018-08-02 DIAGNOSIS — C50411 Malignant neoplasm of upper-outer quadrant of right female breast: Secondary | ICD-10-CM | POA: Insufficient documentation

## 2018-08-02 NOTE — Progress Notes (Signed)
Patient ID: Cassandra Tran, female   DOB: 1970-10-13, 47 y.o.   MRN: [id number]  Chief Complaint  Patient presents with  . Follow-up    HPI Cassandra Tran is a 47 y.o. female.  Patient is here to discuss her surgery for bilateral mastectomy scheduled on 08/08/2018. Patient stated that she had some discomfort on her left breast when she lays to sleep. Patient denied fever, chills, nausea, vomiting, constipation, diarrhea. HPI  Past Medical History:  Diagnosis Date  . BRCA negative 12/26/2017   [facility]   . Cancer ([place]) 11/20/2017   DUCTAL CARCINOMA IN SITU left breast  . GERD (gastroesophageal reflux disease)    OCC-NO MEDS  . Hypertension     Past Surgical History:  Procedure Laterality Date  . BREAST BIOPSY Right 02/21/2014   upper-outer calcs=atypical ductal hyperplasia and background of columnar cell change w/ calcifications. cytologic atypia per rad report from [place].  Marland Kitchen BREAST BIOPSY Right 02/21/2014   Central Right calcs=atypical ductal hyperplasia with atypical lobular hyperplasia in a background of columnar cell change with calcifications and papillary apocrine metaplasia per rad report from [place]  . BREAST BIOPSY Left 11/20/2017   Affirm Bx- DUCTAL CARCINOMA IN SITU  . BREAST BIOPSY Right 11/20/2017   Affirm Bx of 2 areas-  ATYPICAL DUCTAL HYPERPLASIA and PSEUDO-ANGIOMATOUS STROMAL HYPERPLASIA  . BREAST BIOPSY Bilateral 05/18/2018   Procedure: BREAST BIOPSY WITH NEEDLE LOCALIZATION;  Surgeon: Robert Bellow, MD;  Location: [facility] ORS;  Service: General;  Laterality: Bilateral;  . BREAST EXCISIONAL BIOPSY Right 2015   "Benign" per pt  . BREAST EXCISIONAL BIOPSY Left 05/18/2018   NL for  lumpectomy - DCIS  . BREAST EXCISIONAL BIOPSY Right 05/18/2018   NL for lumpectomy for 2 areas of atypical ductal hyperplasia   . CESAREAN SECTION      Family History  Problem Relation Age of Onset  . Hypertension Mother   . Stroke Father   . Breast cancer Neg Hx     Social  History Social History   Tobacco Use  . Smoking status: Current Some Day Smoker    Years: 7.00    Types: E-cigarettes  . Smokeless tobacco: Never Used  . Tobacco comment: Social  Substance Use Topics  . Alcohol use: Yes    Alcohol/week: 3.0 standard drinks    Types: 3 Glasses of wine per week  . Drug use: No    Allergies  Allergen Reactions  . Peanut-Containing Drug Products Anaphylaxis  . Carrot Oil Itching and Other (See Comments)    Throat itchs  . Other     Green Beans    Current Outpatient Medications  Medication Sig Dispense Refill  . amLODipine (NORVASC) 5 MG tablet Take 1 tablet (5 mg total) by mouth daily. (Patient taking differently: Take 5 mg by mouth every morning. ) 90 tablet 1  . ketoconazole (NIZORAL) 2 % cream Apply 1 application topically daily. 60 g 0  . lidocaine-prilocaine (EMLA) cream Apply 1 application topically as needed. Apply to both areolas and cover with plastic wrap one hour prior to leaving for surgery. 5 g 0   No current facility-administered medications for this visit.     Review of Systems Review of Systems  Constitutional: Negative.   Respiratory: Negative.   Cardiovascular: Negative.   Gastrointestinal: Negative.   Skin: Negative.   Neurological: Negative.   Psychiatric/Behavioral: Negative.     Blood pressure (!) 138/91, pulse 90, temperature 97.8 F (36.6 C), temperature source Skin, height 5' 4.5" (1.638 m),  weight 154 lb (69.9 kg), last menstrual period 07/29/2018.  Physical Exam Physical Exam  Constitutional: She is oriented to person, place, and time. She appears well-developed and well-nourished.  Neck: Normal range of motion.  Cardiovascular: Normal rate.  Pulmonary/Chest: Effort normal and breath sounds normal.    Musculoskeletal: Normal range of motion.  Lymphadenopathy:    She has no cervical adenopathy.    She has no axillary adenopathy.       Right: No supraclavicular adenopathy present.       Left: No  supraclavicular adenopathy present.  Neurological: She is alert and oriented to person, place, and time.  Skin: Skin is warm and dry.  Psychiatric: She has a normal mood and affect. Her behavior is normal. Judgment and thought content normal.    Data Reviewed May 18, 2018 pathology:  A. BREAST, RIGHT UPPER OUTER QUADRANT; NEEDLE LOCALIZED WIDE EXCISION:  - TWO FOCI OF INVASIVE MAMMARY CARCINOMA ARISING IN EXTENSIVE DUCTAL  CARCINOMA IN SITU.  - LOBULAR CARCINOMA IN SITU, FOCALLY PLEOMORPHIC.  - FLAT EPITHELIAL ATYPIA AND ATYPICAL DUCTAL HYPERPLASIA.  - SEE CANCER SUMMARY BELOW AND COMMENT.  - CYSTIC PAPILLARY APOCRINE METAPLASIA, AND PSEUDOANGIOMATOUS STROMAL  HYPERPLASIA.    B. BREAST, LEFT RETROAREOLAR; NEEDLE LOCALIZED WIDE EXCISION:  - DUCTAL CARCINOMA IN SITU.  - LOBULAR CARCINOMA IN SITU, FOCALLY PLEOMORPHIC.  - FLAT EPITHELIAL ATYPIA AND ATYPICAL DUCTAL HYPERPLASIA.  - SEE CANCER SUMMARY BELOW AND COMMENT.  - CYSTIC PAPILLARY APOCRINE METAPLASIA, PSEUDOANGIOMATOUS STROMAL  HYPERPLASIA, INTRADUCTAL PAPILLOMA, AND DUCT ECTASIA.   Serum FSH dated July 31, 2018 was 44.5, this would be consistent with postmenopausal state.  Assessment    Extensive DCIS involving the left breast, multifocal invasive carcinoma of the right breast.      Plan    Indications for bilateral mastectomy and sentinel node biopsy were reviewed.  Use of EMLA cream discussed.  The patient reports that she may want to be "a little bigger" and when she previously discussed breast size with plastics.  Dr. Marla Roe has been contacted in this regard and will contact the patient.    Patient is scheduled to have her surgery on 08/08/2018.  Plans for overnight observation reviewed.  Patient was instructed to apply the EMLA cream two hours prior to her surgery. HPI, Physical Exam, Assessment and Plan have been scribed under the direction and in the presence of Robert Bellow, MD. Wayna Chalet,  CMA  I have completed the exam and reviewed the above documentation for accuracy and completeness.  I agree with the above.  Haematologist has been used and any errors in dictation or transcription are unintentional.  Hervey Ard, M.D., F.A.C.S.  Forest Gleason Lilyth Lawyer 08/02/2018, 9:56 PM

## 2018-08-02 NOTE — Assessment & Plan Note (Signed)
Stable and WNL, continue current regimen 

## 2018-08-02 NOTE — Telephone Encounter (Signed)
Patient calling to state that she would like to have a D cup after reconstruction instead of staying the same size as discussed before

## 2018-08-02 NOTE — Patient Instructions (Addendum)
Please apply the EMLA cream on the nipples two hours prior to your scheduled surgery date (11/06/209).  Please give [place] a call once you know if you want your letter for work prior to your surgery or after.

## 2018-08-03 ENCOUNTER — Telehealth: Payer: Self-pay | Admitting: Family Medicine

## 2018-08-03 LAB — BASIC METABOLIC PANEL
BUN / CREAT RATIO: 17 (ref 9–23)
BUN: 17 mg/dL (ref 6–24)
CHLORIDE: 104 mmol/L (ref 96–106)
CO2: 24 mmol/L (ref 20–29)
CREATININE: 1.01 mg/dL — AB (ref 0.57–1.00)
Calcium: 9.3 mg/dL (ref 8.7–10.2)
GFR calc Af Amer: 76 mL/min/{1.73_m2} (ref >59)
GFR calc non Af Amer: 66 mL/min/{1.73_m2} (ref >59)
GLUCOSE: 86 mg/dL (ref 65–99)
POTASSIUM: 4.4 mmol/L (ref 3.5–5.2)
SODIUM: 142 mmol/L (ref 134–144)

## 2018-08-03 LAB — ESTROGENS, TOTAL: ESTROGEN: 158 pg/mL

## 2018-08-03 LAB — FOLLICLE STIMULATING HORMONE: FSH: 44.5 m[IU]/mL

## 2018-08-03 NOTE — Telephone Encounter (Signed)
Result note sent, looks like she's since been notified

## 2018-08-03 NOTE — Telephone Encounter (Signed)
Do you have the results of the recent lab? I did not see a letter or a result note.

## 2018-08-03 NOTE — Telephone Encounter (Signed)
Copied from [place] [phone]. Topic: Quick Communication - See Telephone Encounter >> Aug 03, 2018 11:29 AM Ahmed Prima L wrote: CRM for notification. See Telephone encounter for: 08/03/18.  Patient is requesting her blood work results from 10/29

## 2018-08-03 NOTE — Telephone Encounter (Signed)
Patient notified

## 2018-08-03 NOTE — Telephone Encounter (Signed)
Patient is requesting a call back to discuss her labs.  She states she could not understand the message.  Thanks

## 2018-08-03 NOTE — Telephone Encounter (Signed)
Patient would like to know if she can still get pregnant?

## 2018-08-03 NOTE — Telephone Encounter (Signed)
Very low chance, but still need to be careful until we can fully determine she's in menopause

## 2018-08-07 ENCOUNTER — Encounter: Payer: Self-pay | Admitting: Family Medicine

## 2018-08-08 ENCOUNTER — Encounter: Payer: Self-pay | Admitting: *Deleted

## 2018-08-08 ENCOUNTER — Other Ambulatory Visit: Payer: Self-pay

## 2018-08-08 ENCOUNTER — Observation Stay
Admission: RE | Admit: 2018-08-08 | Discharge: 2018-08-09 | Disposition: A | Discharge status: Home / Self Care | Payer: BC Managed Care – PPO | Source: Ambulatory Visit | Attending: General Surgery | Admitting: General Surgery

## 2018-08-08 ENCOUNTER — Encounter
Admission: RE | Disposition: A | Discharge status: Home / Self Care | Payer: Self-pay | Source: Ambulatory Visit | Attending: General Surgery

## 2018-08-08 ENCOUNTER — Ambulatory Visit: Payer: BC Managed Care – PPO | Admitting: Certified Registered Nurse Anesthetist

## 2018-08-08 ENCOUNTER — Ambulatory Visit
Admission: RE | Admit: 2018-08-08 | Discharge: 2018-08-08 | Disposition: A | Discharge status: Home / Self Care | Payer: BC Managed Care – PPO | Source: Ambulatory Visit | Attending: General Surgery | Admitting: General Surgery

## 2018-08-08 DIAGNOSIS — N6002 Solitary cyst of left breast: Secondary | ICD-10-CM | POA: Diagnosis not present

## 2018-08-08 DIAGNOSIS — D0512 Intraductal carcinoma in situ of left breast: Secondary | ICD-10-CM

## 2018-08-08 DIAGNOSIS — N6082 Other benign mammary dysplasias of left breast: Secondary | ICD-10-CM | POA: Insufficient documentation

## 2018-08-08 DIAGNOSIS — F17200 Nicotine dependence, unspecified, uncomplicated: Secondary | ICD-10-CM | POA: Diagnosis not present

## 2018-08-08 DIAGNOSIS — N6001 Solitary cyst of right breast: Secondary | ICD-10-CM | POA: Diagnosis not present

## 2018-08-08 DIAGNOSIS — Z170 Estrogen receptor positive status [ER+]: Secondary | ICD-10-CM | POA: Diagnosis not present

## 2018-08-08 DIAGNOSIS — N6081 Other benign mammary dysplasias of right breast: Secondary | ICD-10-CM | POA: Insufficient documentation

## 2018-08-08 DIAGNOSIS — C50411 Malignant neoplasm of upper-outer quadrant of right female breast: Secondary | ICD-10-CM

## 2018-08-08 DIAGNOSIS — N6022 Fibroadenosis of left breast: Secondary | ICD-10-CM | POA: Diagnosis not present

## 2018-08-08 DIAGNOSIS — N6091 Unspecified benign mammary dysplasia of right breast: Secondary | ICD-10-CM | POA: Insufficient documentation

## 2018-08-08 DIAGNOSIS — C50912 Malignant neoplasm of unspecified site of left female breast: Secondary | ICD-10-CM

## 2018-08-08 DIAGNOSIS — N6021 Fibroadenosis of right breast: Secondary | ICD-10-CM | POA: Insufficient documentation

## 2018-08-08 DIAGNOSIS — Z79899 Other long term (current) drug therapy: Secondary | ICD-10-CM | POA: Diagnosis not present

## 2018-08-08 DIAGNOSIS — L905 Scar conditions and fibrosis of skin: Secondary | ICD-10-CM | POA: Diagnosis not present

## 2018-08-08 DIAGNOSIS — I10 Essential (primary) hypertension: Secondary | ICD-10-CM | POA: Diagnosis not present

## 2018-08-08 DIAGNOSIS — C50919 Malignant neoplasm of unspecified site of unspecified female breast: Secondary | ICD-10-CM | POA: Diagnosis present

## 2018-08-08 DIAGNOSIS — C50512 Malignant neoplasm of lower-outer quadrant of left female breast: Secondary | ICD-10-CM

## 2018-08-08 HISTORY — PX: MASTECTOMY W/ SENTINEL NODE BIOPSY: SHX2001

## 2018-08-08 HISTORY — PX: BREAST RECONSTRUCTION WITH PLACEMENT OF TISSUE EXPANDER AND FLEX HD (ACELLULAR HYDRATED DERMIS): SHX6295

## 2018-08-08 LAB — POCT PREGNANCY, URINE: PREG TEST UR: NEGATIVE

## 2018-08-08 SURGERY — MASTECTOMY WITH SENTINEL LYMPH NODE BIOPSY
Anesthesia: General | Laterality: Bilateral

## 2018-08-08 MED ORDER — TECHNETIUM TC 99M SULFUR COLLOID FILTERED
0.7290 | Freq: Once | INTRAVENOUS | Status: AC | PRN
  Administered 2018-08-08: 0.729 via INTRADERMAL

## 2018-08-08 MED ORDER — FENTANYL CITRATE (PF) 100 MCG/2ML IJ SOLN: 25.0000 ug | INTRAMUSCULAR | Status: DC | PRN

## 2018-08-08 MED ORDER — HYDROCODONE-ACETAMINOPHEN 5-325 MG PO TABS
1.0000 | ORAL_TABLET | ORAL | Status: DC | PRN
  Filled 2018-08-08: qty 1

## 2018-08-08 MED ORDER — ENOXAPARIN SODIUM 40 MG/0.4ML ~~LOC~~ SOLN
40.0000 mg | SUBCUTANEOUS | Status: DC
  Administered 2018-08-09: 40 mg via SUBCUTANEOUS
  Filled 2018-08-08: qty 0

## 2018-08-08 MED ORDER — NEOMYCIN-POLYMYXIN B GU 40-200000 IR SOLN
Status: DC | PRN
  Administered 2018-08-08: 4 mL

## 2018-08-08 MED ORDER — MIDAZOLAM HCL 2 MG/2ML IJ SOLN
INTRAMUSCULAR | Status: DC | PRN
  Administered 2018-08-08: 2 mg via INTRAVENOUS

## 2018-08-08 MED ORDER — LACTATED RINGERS IV SOLN
INTRAVENOUS | Status: DC
  Administered 2018-08-08 – 2018-08-09 (×2): via INTRAVENOUS

## 2018-08-08 MED ORDER — ONDANSETRON HCL 4 MG PO TABS: 4.0000 mg | ORAL_TABLET | Freq: Every day | ORAL | 1 refills | Status: DC | PRN

## 2018-08-08 MED ORDER — SUCCINYLCHOLINE CHLORIDE 20 MG/ML IJ SOLN
INTRAMUSCULAR | Status: AC
  Filled 2018-08-08: qty 1

## 2018-08-08 MED ORDER — ONDANSETRON HCL 4 MG/2ML IJ SOLN
INTRAMUSCULAR | Status: DC | PRN
  Administered 2018-08-08: 4 mg via INTRAVENOUS

## 2018-08-08 MED ORDER — ROCURONIUM BROMIDE 100 MG/10ML IV SOLN
INTRAVENOUS | Status: DC | PRN
  Administered 2018-08-08: 5 mg via INTRAVENOUS
  Administered 2018-08-08: 10 mg via INTRAVENOUS
  Administered 2018-08-08: 15 mg via INTRAVENOUS
  Administered 2018-08-08: 20 mg via INTRAVENOUS

## 2018-08-08 MED ORDER — AMLODIPINE BESYLATE 5 MG PO TABS
5.0000 mg | ORAL_TABLET | ORAL | Status: DC
  Administered 2018-08-09: 5 mg via ORAL
  Filled 2018-08-08: qty 1

## 2018-08-08 MED ORDER — ROCURONIUM BROMIDE 50 MG/5ML IV SOLN
INTRAVENOUS | Status: AC
  Filled 2018-08-08: qty 1

## 2018-08-08 MED ORDER — SUGAMMADEX SODIUM 200 MG/2ML IV SOLN
INTRAVENOUS | Status: DC | PRN
  Administered 2018-08-08: 139.8 mg via INTRAVENOUS

## 2018-08-08 MED ORDER — FENTANYL CITRATE (PF) 100 MCG/2ML IJ SOLN
INTRAMUSCULAR | Status: AC
  Filled 2018-08-08: qty 2

## 2018-08-08 MED ORDER — CEFAZOLIN SODIUM-DEXTROSE 2-4 GM/100ML-% IV SOLN
INTRAVENOUS | Status: AC
  Filled 2018-08-08: qty 100

## 2018-08-08 MED ORDER — MIDAZOLAM HCL 2 MG/2ML IJ SOLN
INTRAMUSCULAR | Status: AC
  Filled 2018-08-08: qty 2

## 2018-08-08 MED ORDER — METOPROLOL TARTRATE 5 MG/5ML IV SOLN
INTRAVENOUS | Status: DC | PRN
  Administered 2018-08-08: 1 mg via INTRAVENOUS

## 2018-08-08 MED ORDER — METHYLENE BLUE 0.5 % INJ SOLN
INTRAVENOUS | Status: DC | PRN
  Administered 2018-08-08: 6 mL via SUBMUCOSAL

## 2018-08-08 MED ORDER — GLYCOPYRROLATE 0.2 MG/ML IJ SOLN
INTRAMUSCULAR | Status: DC | PRN
  Administered 2018-08-08: 0.2 mg via INTRAVENOUS

## 2018-08-08 MED ORDER — SUGAMMADEX SODIUM 200 MG/2ML IV SOLN
INTRAVENOUS | Status: AC
  Filled 2018-08-08: qty 4

## 2018-08-08 MED ORDER — ACETAMINOPHEN 10 MG/ML IV SOLN
INTRAVENOUS | Status: DC | PRN
  Administered 2018-08-08: 1000 mg via INTRAVENOUS

## 2018-08-08 MED ORDER — FAMOTIDINE 20 MG PO TABS
ORAL_TABLET | ORAL | Status: AC
  Administered 2018-08-08: 20 mg
  Filled 2018-08-08: qty 1

## 2018-08-08 MED ORDER — ACETAMINOPHEN 325 MG PO TABS
650.0000 mg | ORAL_TABLET | ORAL | Status: DC
  Administered 2018-08-08 – 2018-08-09 (×4): 650 mg via ORAL
  Filled 2018-08-08 (×4): qty 2

## 2018-08-08 MED ORDER — PHENYLEPHRINE HCL 10 MG/ML IJ SOLN
INTRAMUSCULAR | Status: DC | PRN
  Administered 2018-08-08 (×4): 100 ug via INTRAVENOUS

## 2018-08-08 MED ORDER — DIAZEPAM 2 MG PO TABS: 2.0000 mg | ORAL_TABLET | Freq: Three times a day (TID) | ORAL | Status: DC | PRN

## 2018-08-08 MED ORDER — SODIUM CHLORIDE (PF) 0.9 % IJ SOLN
INTRAMUSCULAR | Status: AC
  Filled 2018-08-08: qty 10

## 2018-08-08 MED ORDER — PROPOFOL 10 MG/ML IV BOLUS
INTRAVENOUS | Status: AC
Start: 2018-08-08 — End: ?
  Filled 2018-08-08: qty 20

## 2018-08-08 MED ORDER — ONDANSETRON HCL 4 MG PO TABS: 4.0000 mg | ORAL_TABLET | ORAL | Status: DC | PRN

## 2018-08-08 MED ORDER — LACTATED RINGERS IV SOLN
INTRAVENOUS | Status: DC
  Administered 2018-08-08 (×2): via INTRAVENOUS

## 2018-08-08 MED ORDER — ONDANSETRON HCL 4 MG/2ML IJ SOLN
INTRAMUSCULAR | Status: AC
  Filled 2018-08-08: qty 2

## 2018-08-08 MED ORDER — ACETAMINOPHEN 10 MG/ML IV SOLN
INTRAVENOUS | Status: AC
  Filled 2018-08-08: qty 100

## 2018-08-08 MED ORDER — HYDROMORPHONE HCL 1 MG/ML IJ SOLN
0.5000 mg | INTRAMUSCULAR | Status: DC | PRN
  Administered 2018-08-08 (×2): 0.5 mg via INTRAVENOUS

## 2018-08-08 MED ORDER — LIDOCAINE HCL (CARDIAC) PF 100 MG/5ML IV SOSY
PREFILLED_SYRINGE | INTRAVENOUS | Status: DC | PRN
  Administered 2018-08-08: 100 mg via INTRAVENOUS

## 2018-08-08 MED ORDER — FENTANYL CITRATE (PF) 250 MCG/5ML IJ SOLN
INTRAMUSCULAR | Status: AC
  Filled 2018-08-08: qty 5

## 2018-08-08 MED ORDER — DEXAMETHASONE SODIUM PHOSPHATE 10 MG/ML IJ SOLN
INTRAMUSCULAR | Status: DC | PRN
  Administered 2018-08-08: 10 mg via INTRAVENOUS

## 2018-08-08 MED ORDER — GABAPENTIN 300 MG PO CAPS
ORAL_CAPSULE | ORAL | Status: AC
  Administered 2018-08-08: 300 mg
  Filled 2018-08-08: qty 1

## 2018-08-08 MED ORDER — DEXAMETHASONE SODIUM PHOSPHATE 10 MG/ML IJ SOLN
INTRAMUSCULAR | Status: AC
  Filled 2018-08-08: qty 1

## 2018-08-08 MED ORDER — TECHNETIUM TC 99M SULFUR COLLOID FILTERED
0.7780 | Freq: Once | INTRAVENOUS | Status: AC | PRN
  Administered 2018-08-08: 0.778 via INTRADERMAL

## 2018-08-08 MED ORDER — PROMETHAZINE HCL 25 MG/ML IJ SOLN: 6.2500 mg | INTRAMUSCULAR | Status: DC | PRN

## 2018-08-08 MED ORDER — CEFAZOLIN SODIUM-DEXTROSE 2-4 GM/100ML-% IV SOLN
2.0000 g | Freq: Three times a day (TID) | INTRAVENOUS | Status: DC
  Administered 2018-08-08: 2 g via INTRAVENOUS
  Filled 2018-08-08 (×7): qty 100

## 2018-08-08 MED ORDER — HYDROMORPHONE HCL 1 MG/ML IJ SOLN
INTRAMUSCULAR | Status: AC
  Administered 2018-08-08: 0.5 mg via INTRAVENOUS
  Filled 2018-08-08: qty 1

## 2018-08-08 MED ORDER — PROMETHAZINE HCL 25 MG/ML IJ SOLN: 12.5000 mg | INTRAMUSCULAR | Status: DC | PRN

## 2018-08-08 MED ORDER — LIDOCAINE HCL (PF) 2 % IJ SOLN
INTRAMUSCULAR | Status: AC
  Filled 2018-08-08: qty 10

## 2018-08-08 MED ORDER — HYDROCODONE-ACETAMINOPHEN 5-325 MG PO TABS: 1.0000 | ORAL_TABLET | ORAL | 0 refills | Status: DC | PRN

## 2018-08-08 MED ORDER — CEFAZOLIN SODIUM-DEXTROSE 1-4 GM/50ML-% IV SOLN
INTRAVENOUS | Status: DC | PRN
  Administered 2018-08-08: 1 g via INTRAVENOUS

## 2018-08-08 MED ORDER — DIAZEPAM 2 MG PO TABS: 2.0000 mg | ORAL_TABLET | Freq: Two times a day (BID) | ORAL | 0 refills | Status: DC | PRN

## 2018-08-08 MED ORDER — FENTANYL CITRATE (PF) 100 MCG/2ML IJ SOLN
INTRAMUSCULAR | Status: DC | PRN
  Administered 2018-08-08: 100 ug via INTRAVENOUS
  Administered 2018-08-08 (×2): 50 ug via INTRAVENOUS
  Administered 2018-08-08: 100 ug via INTRAVENOUS
  Administered 2018-08-08: 50 ug via INTRAVENOUS
  Administered 2018-08-08: 100 ug via INTRAVENOUS
  Administered 2018-08-08 (×2): 50 ug via INTRAVENOUS

## 2018-08-08 MED ORDER — KETOROLAC TROMETHAMINE 30 MG/ML IJ SOLN
INTRAMUSCULAR | Status: DC | PRN
  Administered 2018-08-08: 30 mg via INTRAVENOUS

## 2018-08-08 MED ORDER — PROPOFOL 10 MG/ML IV BOLUS
INTRAVENOUS | Status: DC | PRN
  Administered 2018-08-08: 150 mg via INTRAVENOUS

## 2018-08-08 MED ORDER — SUCCINYLCHOLINE CHLORIDE 20 MG/ML IJ SOLN
INTRAMUSCULAR | Status: DC | PRN
  Administered 2018-08-08: 100 mg via INTRAVENOUS

## 2018-08-08 MED ORDER — CEFAZOLIN SODIUM-DEXTROSE 2-4 GM/100ML-% IV SOLN
2.0000 g | INTRAVENOUS | Status: AC
  Administered 2018-08-08: 2 g via INTRAVENOUS

## 2018-08-08 MED ORDER — CEFAZOLIN SODIUM 1 G IJ SOLR
INTRAMUSCULAR | Status: AC
  Filled 2018-08-08: qty 10

## 2018-08-08 MED ORDER — FAMOTIDINE 20 MG PO TABS: 20.0000 mg | ORAL_TABLET | Freq: Once | ORAL | Status: DC

## 2018-08-08 MED ORDER — CEPHALEXIN 500 MG PO CAPS: 500.0000 mg | ORAL_CAPSULE | Freq: Four times a day (QID) | ORAL | 0 refills | Status: AC

## 2018-08-08 MED ORDER — MORPHINE SULFATE (PF) 2 MG/ML IV SOLN
2.0000 mg | INTRAVENOUS | Status: DC | PRN
  Administered 2018-08-08 – 2018-08-09 (×4): 2 mg via INTRAVENOUS
  Filled 2018-08-08 (×4): qty 1

## 2018-08-08 MED ORDER — KETOROLAC TROMETHAMINE 30 MG/ML IJ SOLN
INTRAMUSCULAR | Status: AC
  Filled 2018-08-08: qty 1

## 2018-08-08 MED ORDER — IBUPROFEN 400 MG PO TABS: 200.0000 mg | ORAL_TABLET | ORAL | Status: DC | PRN

## 2018-08-08 MED ORDER — GABAPENTIN 300 MG PO CAPS: 300.0000 mg | ORAL_CAPSULE | ORAL | Status: DC

## 2018-08-08 SURGICAL SUPPLY — 97 items
APPLIER CLIP 11 MED OPEN (CLIP)
APPLIER CLIP 13 LRG OPEN (CLIP)
BAG DECANTER FOR FLEXI CONT (MISCELLANEOUS) ×3 IMPLANT
BINDER BREAST LRG (GAUZE/BANDAGES/DRESSINGS) ×1 IMPLANT
BINDER BREAST MEDIUM (GAUZE/BANDAGES/DRESSINGS) ×1 IMPLANT
BINDER BREAST XLRG (GAUZE/BANDAGES/DRESSINGS) ×3 IMPLANT
BINDER BREAST XXLRG (GAUZE/BANDAGES/DRESSINGS) ×1 IMPLANT
BIOPATCH WHT 1IN DISK W/4.0 H (GAUZE/BANDAGES/DRESSINGS) ×6 IMPLANT
BLADE BOVIE TIP EXT 4 (BLADE) ×3 IMPLANT
BLADE PHOTON ILLUMINATED (MISCELLANEOUS) ×2 IMPLANT
BLADE SURG 15 STRL LF DISP TIS (BLADE) ×1 IMPLANT
BLADE SURG 15 STRL SS (BLADE) ×2
BLADE SURG 15 STRL SS SAFETY (BLADE) ×3 IMPLANT
BNDG GAUZE 4.5X4.1 6PLY STRL (MISCELLANEOUS) ×6 IMPLANT
BULB RESERV EVAC DRAIN JP 100C (MISCELLANEOUS) ×6 IMPLANT
CANISTER SUCT 1200ML W/VALVE (MISCELLANEOUS) ×4 IMPLANT
CHLORAPREP W/TINT 26ML (MISCELLANEOUS) ×7 IMPLANT
CLIP APPLIE 11 MED OPEN (CLIP) IMPLANT
CLIP APPLIE 13 LRG OPEN (CLIP) IMPLANT
CLOSURE WOUND 1/2 X4 (GAUZE/BANDAGES/DRESSINGS)
CNTNR SPEC 2.5X3XGRAD LEK (MISCELLANEOUS) ×3
CONT SPEC 4OZ STER OR WHT (MISCELLANEOUS) ×6
CONTAINER SPEC 2.5X3XGRAD LEK (MISCELLANEOUS) ×3 IMPLANT
COVER WAND RF STERILE (DRAPES) ×1 IMPLANT
DECANTER SPIKE VIAL GLASS SM (MISCELLANEOUS) IMPLANT
DERMABOND ADVANCED (GAUZE/BANDAGES/DRESSINGS) ×4
DERMABOND ADVANCED .7 DNX12 (GAUZE/BANDAGES/DRESSINGS) ×3 IMPLANT
DRAIN CHANNEL 19F RND (DRAIN) ×6 IMPLANT
DRAIN CHANNEL JP 15F RND 16 (MISCELLANEOUS) IMPLANT
DRAPE LAPAROTOMY TRNSV 106X77 (MISCELLANEOUS) ×3 IMPLANT
DRSG GAUZE FLUFF 36X18 (GAUZE/BANDAGES/DRESSINGS) ×1 IMPLANT
DRSG TELFA 3X8 NADH (GAUZE/BANDAGES/DRESSINGS) IMPLANT
ELECT CAUTERY BLADE 6.4 (BLADE) ×1 IMPLANT
ELECT CAUTERY BLADE TIP 2.5 (TIP)
ELECT REM PT RETURN 9FT ADLT (ELECTROSURGICAL) ×3
ELECTRODE CAUTERY BLDE TIP 2.5 (TIP) ×2 IMPLANT
ELECTRODE REM PT RTRN 9FT ADLT (ELECTROSURGICAL) ×1 IMPLANT
GAUZE SPONGE 4X4 12PLY STRL (GAUZE/BANDAGES/DRESSINGS) ×3 IMPLANT
GLOVE BIO SURGEON STRL SZ 6.5 (GLOVE) ×7 IMPLANT
GLOVE BIO SURGEON STRL SZ7.5 (GLOVE) ×3 IMPLANT
GLOVE BIO SURGEONS STRL SZ 6.5 (GLOVE) ×5
GLOVE INDICATOR 8.0 STRL GRN (GLOVE) ×5 IMPLANT
GOWN STRL REUS W/ TWL LRG LVL3 (GOWN DISPOSABLE) ×6 IMPLANT
GOWN STRL REUS W/TWL LRG LVL3 (GOWN DISPOSABLE) ×8
GRAFT FLEX HD 4X16 THICK (Tissue Mesh) ×6 IMPLANT
IMPL BREAST 300CC (Breast) IMPLANT
IMPLANT BREAST 300CC (Breast) ×6 IMPLANT
IV NS 1000ML (IV SOLUTION)
IV NS 1000ML BAXH (IV SOLUTION) ×1 IMPLANT
IV NS 500ML (IV SOLUTION)
IV NS 500ML BAXH (IV SOLUTION) ×1 IMPLANT
LABEL OR SOLS (LABEL) ×3 IMPLANT
NDL 21 GA WING INFUSION (NEEDLE) ×1 IMPLANT
NDL HYPO 25X1 1.5 SAFETY (NEEDLE) IMPLANT
NEEDLE 21 GA WING INFUSION (NEEDLE) ×3 IMPLANT
NEEDLE HYPO 25X1 1.5 SAFETY (NEEDLE) ×3 IMPLANT
PACK BASIN MAJOR ARMC (MISCELLANEOUS) ×3 IMPLANT
PACK BASIN MINOR ARMC (MISCELLANEOUS) ×3 IMPLANT
PACK UNIVERSAL (MISCELLANEOUS) ×3 IMPLANT
PAD ABD DERMACEA PRESS 5X9 (GAUZE/BANDAGES/DRESSINGS) ×8 IMPLANT
PAD DRESSING TELFA 3X8 NADH (GAUZE/BANDAGES/DRESSINGS) ×1 IMPLANT
PAD PREP 24X41 OB/GYN DISP (PERSONAL CARE ITEMS) ×2 IMPLANT
PIN SAFETY STRL (MISCELLANEOUS) ×4 IMPLANT
RETRACTOR RING XSMALL (MISCELLANEOUS) ×1 IMPLANT
RTRCTR WOUND ALEXIS 13CM XS SH (MISCELLANEOUS)
SET ASEPTIC TRANSFER (MISCELLANEOUS) ×5 IMPLANT
SHEARS FOC LG CVD HARMONIC 17C (MISCELLANEOUS) IMPLANT
SLEVE PROBE SENORX GAMMA FIND (MISCELLANEOUS) ×3 IMPLANT
SPONGE LAP 18X18 RF (DISPOSABLE) ×8 IMPLANT
STRIP CLOSURE SKIN 1/2X4 (GAUZE/BANDAGES/DRESSINGS) ×2 IMPLANT
SUT ETHILON 3-0 FS-10 30 BLK (SUTURE)
SUT MNCRL 3-0 UNDYED SH (SUTURE) IMPLANT
SUT MNCRL AB 4-0 PS2 18 (SUTURE) ×8 IMPLANT
SUT MNCRL+ 5-0 UNDYED PC-3 (SUTURE) ×3 IMPLANT
SUT MON AB 3-0 SH 27 (SUTURE) ×4 IMPLANT
SUT MONOCRYL 3-0 UNDYED (SUTURE) ×6
SUT MONOCRYL 5-0 (SUTURE) ×4
SUT PDS AB 1 CT1 27 (SUTURE) ×1 IMPLANT
SUT PDS II 3-0 (SUTURE) ×2 IMPLANT
SUT PDS PLUS 2 (SUTURE) ×10
SUT PDS PLUS AB 2-0 CT-1 (SUTURE) ×6 IMPLANT
SUT SILK 2 0 (SUTURE) ×2
SUT SILK 2-0 30XBRD TIE 12 (SUTURE) ×1 IMPLANT
SUT SILK 3 0 (SUTURE)
SUT SILK 3 0 SH 30 (SUTURE) ×6 IMPLANT
SUT SILK 3-0 18XBRD TIE 12 (SUTURE) ×1 IMPLANT
SUT VIC AB 2-0 CT1 27 (SUTURE)
SUT VIC AB 2-0 CT1 TAPERPNT 27 (SUTURE) ×4 IMPLANT
SUT VIC AB 3-0 SH 27 (SUTURE) ×2
SUT VIC AB 3-0 SH 27X BRD (SUTURE) ×2 IMPLANT
SUT VICRYL+ 3-0 144IN (SUTURE) ×1 IMPLANT
SUTURE EHLN 3-0 FS-10 30 BLK (SUTURE) ×1 IMPLANT
SWABSTK COMLB BENZOIN TINCTURE (MISCELLANEOUS) ×1 IMPLANT
SYR BULB IRRIG 60ML STRL (SYRINGE) ×3 IMPLANT
SYR CONTROL 10ML (SYRINGE) IMPLANT
TAPE TRANSPORE STRL 2 31045 (GAUZE/BANDAGES/DRESSINGS) ×3 IMPLANT
TOWEL OR 17X26 4PK STRL BLUE (TOWEL DISPOSABLE) ×3 IMPLANT

## 2018-08-08 NOTE — Anesthesia Post-op Follow-up Note (Signed)
Anesthesia QCDR form completed.        

## 2018-08-08 NOTE — Anesthesia Preprocedure Evaluation (Signed)
Anesthesia Evaluation  Patient identified by MRN, date of birth, ID band Patient awake    Reviewed: Allergy & Precautions, H&P , NPO status , Patient's Chart, lab work & pertinent test results  History of Anesthesia Complications Negative for: history of anesthetic complications  Airway Mallampati: III  TM Distance: >3 FB Neck ROM: full    Dental  (+) Teeth Intact, Dental Advidsory Given   Pulmonary neg shortness of breath, neg sleep apnea, neg COPD, neg recent URI, Current Smoker,           Cardiovascular Exercise Tolerance: Good hypertension, (-) angina(-) CAD, (-) Past MI, (-) Cardiac Stents and (-) CABG (-) dysrhythmias (-) Valvular Problems/Murmurs     Neuro/Psych negative neurological ROS  negative psych ROS   GI/Hepatic Neg liver ROS, GERD  ,  Endo/Other  negative endocrine ROS  Renal/GU negative Renal ROS  negative genitourinary   Musculoskeletal   Abdominal   Peds  Hematology negative hematology ROS (+)   Anesthesia Other Findings Past Medical History: 12/26/2017: BRCA negative     Comment:  [facility]  11/20/2017: Cancer ([place])     Comment:  DUCTAL CARCINOMA IN SITU left breast No date: GERD (gastroesophageal reflux disease)     Comment:  OCC-NO MEDS No date: Hypertension  Past Surgical History: 02/21/2014: BREAST BIOPSY; Right     Comment:  upper-outer calcs=atypical ductal hyperplasia and               background of columnar cell change w/ calcifications.               cytologic atypia per rad report from [place]. 02/21/2014: BREAST BIOPSY; Right     Comment:  Central Right calcs=atypical ductal hyperplasia with               atypical lobular hyperplasia in a background of columnar               cell change with calcifications and papillary apocrine               metaplasia per rad report from [place] 11/20/2017: BREAST BIOPSY; Left     Comment:  Affirm Bx- DUCTAL CARCINOMA IN SITU 11/20/2017:  BREAST BIOPSY; Right     Comment:  Affirm Bx of 2 areas-  ATYPICAL DUCTAL HYPERPLASIA and               PSEUDO-ANGIOMATOUS STROMAL HYPERPLASIA 2015: BREAST EXCISIONAL BIOPSY; Right     Comment:  "Benign" per pt 05/18/2018: BREAST EXCISIONAL BIOPSY; Left     Comment:  NL for  lumpectomy - DCIS 05/18/2018: BREAST EXCISIONAL BIOPSY; Right     Comment:  NL for lumpectomy for 2 areas of atypical ductal               hyperplasia  No date: CESAREAN SECTION  BMI    Body Mass Index:  27.10 kg/m      Reproductive/Obstetrics negative OB ROS                             Anesthesia Physical  Anesthesia Plan  ASA: II  Anesthesia Plan: General ETT   Post-op Pain Management:    Induction: Intravenous  PONV Risk Score and Plan: Ondansetron, Dexamethasone, Midazolam, Promethazine and Treatment may vary due to age or medical condition  Airway Management Planned: Oral ETT  Additional Equipment:   Intra-op Plan:   Post-operative Plan: Extubation in OR  Informed Consent: I have reviewed  the patients History and Physical, chart, labs and discussed the procedure including the risks, benefits and alternatives for the proposed anesthesia with the patient or authorized representative who has indicated his/her understanding and acceptance.   Dental Advisory Given  Plan Discussed with: Anesthesiologist, CRNA and Surgeon  Anesthesia Plan Comments:         Anesthesia Quick Evaluation

## 2018-08-08 NOTE — Transfer of Care (Signed)
Immediate Anesthesia Transfer of Care Note  Patient: Cassandra Tran  Procedure(s) Performed: MASTECTOMY WITH SENTINEL LYMPH NODE BIOPSY (Bilateral ) BREAST RECONSTRUCTION WITH PLACEMENT OF TISSUE EXPANDER AND FLEX HD (ACELLULAR HYDRATED DERMIS) (Bilateral )  Patient Location: PACU  Anesthesia Type:General  Level of Consciousness: sedated  Airway & Oxygen Therapy: Patient Spontanous Breathing and Patient connected to face mask oxygen  Post-op Assessment: Report given to RN and Post -op Vital signs reviewed and stable  Post vital signs: Reviewed and stable  Last Vitals:  Vitals Value Taken Time  BP    Temp    Pulse 90 08/08/2018  4:12 PM  Resp    SpO2 100 % 08/08/2018  4:12 PM  Vitals shown include unvalidated device data.  Last Pain:  Vitals:   08/08/18 1124  TempSrc: Temporal  PainSc: 0-No pain         Complications: No apparent anesthesia complications

## 2018-08-08 NOTE — Anesthesia Procedure Notes (Signed)
Procedure Name: Intubation Performed by: Demetrius Charity, CRNA Pre-anesthesia Checklist: Patient identified, Patient being monitored, Timeout performed, Emergency Drugs available and Suction available Patient Re-evaluated:Patient Re-evaluated prior to induction Oxygen Delivery Method: Circle system utilized Preoxygenation: Pre-oxygenation with 100% oxygen Induction Type: IV induction Ventilation: Mask ventilation without difficulty Laryngoscope Size: McGraph and 4 Grade View: Grade I Tube type: Oral Tube size: 7.0 mm Number of attempts: 1 Airway Equipment and Method: Stylet Placement Confirmation: ETT inserted through vocal cords under direct vision,  positive ETCO2 and breath sounds checked- equal and bilateral Secured at: 21 cm Tube secured with: Tape Dental Injury: Teeth and Oropharynx as per pre-operative assessment  Future Recommendations: Recommend- induction with short-acting agent, and alternative techniques readily available Comments: Unable to obtain view of cords with Mac 4, grade 1 view with McGraph 4.

## 2018-08-08 NOTE — Op Note (Signed)
Op report    DATE OF OPERATION:  08/08/2018  LOCATION: [facility]  SURGICAL DIVISION: Plastic Surgery  PREOPERATIVE DIAGNOSES:  1. Left Breast cancer.    POSTOPERATIVE DIAGNOSES:  1. Left Breast cancer.   PROCEDURE:  1. Left immediate breast reconstruction with placement of Acellular Dermal Matrix and tissue expanders. 2. Right immediate breast reconstruction with placement of Acellular Dermal Matrix and tissue expanders.  SURGEON: Sriman Tally Sanger Anisten Tomassi, DO  ANESTHESIA:  General.   COMPLICATIONS: None.   IMPLANTS: Left - Mentor 300 cc. Ref #[id number].  Serial Number [id number], 150 cc of injectable saline placed in the expander. Right - Mentor 300 cc. Ref #[id number].  Serial Number [id number], 100 cc of injectable saline placed in the expander. Acellular Dermal Matrix 4 x 16 cm Two  INDICATIONS FOR PROCEDURE:  The patient, Kendra Harrington, is a 47 y.o. female born on September 12, 1971, is here for immediate first stage breast reconstruction with placement of bilateral tissue expander and Acellular dermal matrix. MRN: [id number]  CONSENT:  Informed consent was obtained directly from the patient. Risks, benefits and alternatives were fully discussed. Specific risks including but not limited to bleeding, infection, hematoma, seroma, scarring, pain, implant infection, implant extrusion, capsular contracture, asymmetry, wound healing problems, and need for further surgery were all discussed. The patient did have an ample opportunity to have her questions answered to her satisfaction.   DESCRIPTION OF PROCEDURE:  The patient was taken to the operating room by the general surgery team. SCDs were placed and IV antibiotics were given. The patient's chest was prepped and draped in a sterile fashion. A time out was performed and the implants to be used were identified.  Bilateral mastectomies were performed.  Once the general surgery team had completed their portion of the  case the patient was rendered to the plastic and reconstructive surgery team.  Left:  The pectoralis major muscle was lifted from the chest wall with release of the lateral edge and lateral inframammary fold.  The pocket was irrigated with antibiotic solution and hemostasis was achieved with electrocautery.  The ADM was then prepared according to the manufacture guidelines and slits placed to help with postoperative fluid management.  The ADM was then sutured to the inferior and lateral edge of the inframammary fold with 2-0 PDS starting with an interrupted stitch and then a running stitch.  The lateral portion was sutured to with interrupted sutures after the expander was placed.  The expander was prepared according to the manufacture guidelines, the air evacuated and then it was placed under the ADM and pectoralis major muscle.  The inferior and lateral tabs were used to secure the expander to the chest wall with 2-0 PDS.  The drain was placed at the inframammary fold over the ADM and secured to the skin with 3-0 Silk.  The deep layers were closed with 3-0 Monocryl followed by 4-0 Monocryl.  The skin was closed with 5-0 Monocryl and then dermabond was applied.   Right:   The pectoralis major muscle was lifted from the chest wall with release of the lateral edge and lateral inframammary fold.  The pocket was irrigated with antibiotic solution and hemostasis was achieved with electrocautery.  The ADM was then prepared according to the manufacture guidelines and slits placed to help with postoperative fluid management.  The ADM was then sutured to the inferior and lateral edge of the inframammary fold with 2-0 PDS starting with an interrupted stitch and then a running stitch.  The lateral  portion was sutured to with interrupted sutures after the expander was placed.  The expander was prepared according to the manufacture guidelines, the air evacuated and then it was placed under the ADM and pectoralis major  muscle.  The inferior and lateral tabs were used to secure the expander to the chest wall with 2-0 PDS.  The drain was placed at the inframammary fold over the ADM and secured to the skin with 3-0 Silk.  The deep layers were closed with 3-0 Monocryl followed by 4-0 Monocryl.  The skin was closed with 5-0 Monocryl and then dermabond was applied.  The ABDs and breast binder were placed.  The patient tolerated the procedure well and there were no complications.  The patient was allowed to wake from anesthesia and taken to the recovery room in satisfactory condition.

## 2018-08-08 NOTE — H&P (Signed)
No change in clinical history or exam.  For bilateral mastectomy and reconstruction.

## 2018-08-08 NOTE — Anesthesia Postprocedure Evaluation (Signed)
Anesthesia Post Note  Patient: Kendra Harrington  Procedure(s) Performed: MASTECTOMY WITH SENTINEL LYMPH NODE BIOPSY (Bilateral ) BREAST RECONSTRUCTION WITH PLACEMENT OF TISSUE EXPANDER AND FLEX HD (ACELLULAR HYDRATED DERMIS) (Bilateral )  Patient location during evaluation: PACU Anesthesia Type: General Level of consciousness: awake and alert Pain management: pain level controlled Vital Signs Assessment: post-procedure vital signs reviewed and stable Respiratory status: spontaneous breathing, nonlabored ventilation, respiratory function stable and patient connected to nasal cannula oxygen Cardiovascular status: blood pressure returned to baseline and stable Postop Assessment: no apparent nausea or vomiting Anesthetic complications: no     Last Vitals:  Vitals:   08/08/18 1659 08/08/18 1714  BP: 136/76 128/73  Pulse: 82 81  Resp: 17 11  Temp:  36.6 C  SpO2: 90% 98%    Last Pain:  Vitals:   08/08/18 1714  TempSrc:   PainSc: Asleep                 Precious Haws Piscitello

## 2018-08-08 NOTE — Op Note (Addendum)
Preoperative diagnosis: Bilateral breast cancer.  Postoperative diagnosis: Same.  Operative procedure: Bilateral simple mastectomy, sentinel node biopsy (Wynne Rozak); bilateral expander placement (Dillingham).  Anesthesia: General endotracheal.  Estimated blood loss: 100 cc.  Clinical note: This 47 year old woman had originally undergone stereotactic biopsy with identification of DCIS in the left breast and multifocal atypical ductal hyperplasia and flat epithelial hyperplasia in the right.  She subsequently underwent surgical excision with findings of extensive DCIS and 2 foci of invasive mammary carcinoma on the right and DCIS on the left.  Considering the extent of disease she was felt to be a candidate for mastectomy.  She desired breast reconstruction.  Patient received Ancef prior to the procedure.  SCD stockings for DVT prevention.  The patient underwent technetium injection bilaterally the morning of the procedure.  Operative note: With the patient under general anesthesia the breast chest and axilla was cleansed with ChloraPrep and draped after injection of 5 cc of 0.5% methylene blue bilaterally.  The left breast was approached first through a elliptical incision centered on the nipple and to include the original excision site at the 4 o'clock position.  Skin flaps are outlined and the skin incised sharply the remaining dissection completed with a electrocautery.  The photon blade was used for j for dissection.  Hemostasis was with 3-0 Vicryl ties.  Flaps were elevated to the clavicle superiorly, sternum medially, rectus fascia inferiorly and serratus muscle laterally taking care to preserve the inframammary fold.  The node seeker device was used and identified to, hot blue nodes in the axilla.  These were sent in formalin for routine histology.  The breast was elevated off the underlying pectoralis muscle taking the fascia with the specimen.  This was labeled medial and lateral appropriately  and sent to pathology fresh per protocol.  At this time Dr. Marla Roe joined the procedure and began work on the left expander placement.  The plastic surgery portion of the procedure will be dictated separately.  Attention was turned back to the right breast.  Similar flaps were made to include the curvilinear incision in the 9 through 12 o'clock position.  Flaps were again raised as noted on the left side.  Scanning through the axilla showed no areas of increased uptake.  No palpable adenopathy.  No sentinel nodes extracted at this time.  The breast was elevated off the underlying pectoralis muscle, orientated and sent fresh for histology.  Both wounds were irrigated with sterile water at the end of the resection.  The patient tolerated the procedure well and was brought to the recovery room in stable condition.

## 2018-08-08 NOTE — Interval H&P Note (Signed)
History and Physical Interval Note:  08/08/2018 12:32 PM  Kendra Harrington  has presented today for surgery, with the diagnosis of bilateral breast cancer  The various methods of treatment have been discussed with the patient and family. After consideration of risks, benefits and other options for treatment, the patient has consented to  Procedure(s): MASTECTOMY WITH SENTINEL LYMPH NODE BIOPSY (Bilateral) BREAST RECONSTRUCTION WITH PLACEMENT OF TISSUE EXPANDER AND FLEX HD (ACELLULAR HYDRATED DERMIS) (Bilateral) as a surgical intervention .  The patient's history has been reviewed, patient examined, no change in status, stable for surgery.  I have reviewed the patient's chart and labs.  Questions were answered to the patient's satisfaction.     [name] Lelend Heinecke

## 2018-08-09 ENCOUNTER — Encounter: Payer: Self-pay | Admitting: General Surgery

## 2018-08-09 DIAGNOSIS — C50411 Malignant neoplasm of upper-outer quadrant of right female breast: Secondary | ICD-10-CM | POA: Diagnosis not present

## 2018-08-09 NOTE — Discharge Planning (Signed)
The patient has been discharged.  

## 2018-08-09 NOTE — Progress Notes (Signed)
AVSS. Sore, but tolerable. Lungs: Clear. Cardio: RR. Flaps: Healthy. JP drain: Sang/ sero sang.  Reviewed d/c instructions.

## 2018-08-11 MED ORDER — HYDROCODONE-ACETAMINOPHEN 5-325 MG PO TABS: 1.0000 | ORAL_TABLET | Freq: Four times a day (QID) | ORAL | 0 refills | Status: AC | PRN

## 2018-08-11 NOTE — Addendum Note (Signed)
Addended by: Wallace Going on: 08/11/2018 10:24 AM   Modules accepted: Orders

## 2018-08-13 LAB — SURGICAL PATHOLOGY

## 2018-08-14 ENCOUNTER — Encounter: Payer: Self-pay | Admitting: Plastic Surgery

## 2018-08-14 ENCOUNTER — Telehealth: Payer: Self-pay | Admitting: General Surgery

## 2018-08-14 ENCOUNTER — Ambulatory Visit (INDEPENDENT_AMBULATORY_CARE_PROVIDER_SITE_OTHER): Payer: BC Managed Care – PPO | Admitting: Plastic Surgery

## 2018-08-14 ENCOUNTER — Telehealth: Payer: Self-pay | Admitting: *Deleted

## 2018-08-14 VITALS — BP 134/76 | HR 71 | Ht 63.0 in | Wt 153.0 lb

## 2018-08-14 DIAGNOSIS — Z170 Estrogen receptor positive status [ER+]: Secondary | ICD-10-CM

## 2018-08-14 DIAGNOSIS — C50411 Malignant neoplasm of upper-outer quadrant of right female breast: Secondary | ICD-10-CM

## 2018-08-14 DIAGNOSIS — Z9889 Other specified postprocedural states: Secondary | ICD-10-CM | POA: Insufficient documentation

## 2018-08-14 DIAGNOSIS — D0512 Intraductal carcinoma in situ of left breast: Secondary | ICD-10-CM

## 2018-08-14 MED ORDER — KETOROLAC TROMETHAMINE 10 MG PO TABS: 10.0000 mg | ORAL_TABLET | Freq: Three times a day (TID) | ORAL | 0 refills | Status: DC | PRN

## 2018-08-14 MED ORDER — DIAZEPAM 2 MG PO TABS: 2.0000 mg | ORAL_TABLET | Freq: Two times a day (BID) | ORAL | 0 refills | Status: AC | PRN

## 2018-08-14 MED ORDER — NAPROXEN 500 MG PO TBEC: 500.0000 mg | DELAYED_RELEASE_TABLET | Freq: Two times a day (BID) | ORAL | 0 refills | Status: DC

## 2018-08-14 NOTE — Addendum Note (Signed)
Addended by: Wallace Going on: 08/14/2018 01:02 PM   Modules accepted: Orders

## 2018-08-14 NOTE — Progress Notes (Signed)
   Subjective:    Patient ID: Kendra Harrington, female    DOB: Apr 07, 1971, 47 y.o.   MRN: [id number]  The patient is a 47 year old black female here for follow-up after immediate breast reconstruction bilaterally with expanders and flex HD placement.  She has been doing well with her drain measurements.  Drain output is too high for removal but minimal.  It is serosanguineous in color.  No sign of infection, seroma or hematoma.  Her pain is well controlled.  Review of Systems  Constitutional: Negative.   HENT: Negative.   Eyes: Negative.   Respiratory: Negative.   Genitourinary: Negative.   Musculoskeletal: Negative.   Skin: Negative.   Neurological: Negative.        Objective:   Physical Exam  Constitutional: She is oriented to person, place, and time. She appears well-developed and well-nourished.  HENT:  Head: Atraumatic.  Cardiovascular: Normal rate.  Pulmonary/Chest: Effort normal.  Abdominal: Soft.  Neurological: She is alert and oriented to person, place, and time.  Skin: Skin is warm.  Psychiatric: She has a normal mood and affect. Her behavior is normal. Thought content normal.       Assessment & Plan:  Malignant neoplasm of upper-outer quadrant of right breast in female, estrogen receptor positive ([place])  Ductal carcinoma in situ (DCIS) of left breast  S/P breast reconstruction, bilateral Continue with binder and drain care.  Will call in more Valium. We placed injectable saline in the Expander using a sterile technique: Right: 50 cc for a total of 150 / 300 cc Left: 50 cc for a total of 200 / 300 cc

## 2018-08-14 NOTE — Telephone Encounter (Signed)
Notified pathology fine.   Still with significant pain.  Reviewed use of Valium to help with muscle spasms.

## 2018-08-14 NOTE — Telephone Encounter (Signed)
-----   Message from Robert Bellow, MD sent at 08/14/2018  1:00 PM EST ----- Please notify the patient that I got permission to have her use anti-inflammatories to help manage her pain. If she has Aleve at home, ask her to take two tabs, twice a day.  Continue valium.  If she doesnot have aleve, send in RX for naprosyn, 500 (550) po BID. #30. NRF.Thanks.

## 2018-08-14 NOTE — Telephone Encounter (Signed)
Notified patient as instructed, She does not have aleve at home and wants RX sent to pharmacy, done, pt agrees. Aware she can continue to take her Valium.

## 2018-08-20 NOTE — Discharge Summary (Signed)
Physician Discharge Summary  Patient ID: Cassandra Tran MRN: [id number] DOB/AGE: [birth year] 48 y.o.  Admit date: 08/08/2018 Discharge date: 08/20/2018  Admission Diagnoses:  Discharge Diagnoses:  Active Problems:   Breast cancer [facility])   Discharged Condition: good  Hospital Course: The patient underwent bilateral mastectomy and sentinel node biopsy followed by immediate expander placement.  She was observed overnight for pain control.  She did well.  Instructed on drain management.  Consults: None  Significant Diagnostic Studies: Pathology showed residual DCIS in lobular carcinoma in situ on the right, residual DCIS on the left.  Treatments: Surgery}  Discharge Exam: Blood pressure 130/86, pulse 60, temperature 98 F (36.7 C), temperature source Oral, resp. rate 18, height 5\' 4"  (1.626 m), weight 69.8 kg, last menstrual period 07/29/2018, SpO2 99 %. Chest wall: no tenderness, No evidence of flap necrosis.  Minimal bruising.  Disposition:   Discharge Instructions    Diet - low sodium heart healthy   Complete by:  As directed    Discharge instructions   Complete by:  As directed    Keep pink wrap dry. May wash under arms and use deodorant.  No driving until pain free.  Tylenol: 2-325 mg tablets every four hours while awake. Substitute 1 Norco (hydrocodone) and 1-325 mg tylenol if more pain than soreness at the four hour mark.  Laxative of choice if needed.  Valium if needed for muscle spasms.  Empty and record drainage 2-3 x /day. Bring drainage record to all office visits.  Follow up with Dr. Marla Roe on Tuesday, November 12 at 9: 30 AM at her [place] office.   Increase activity slowly   Complete by:  As directed      Allergies as of 08/09/2018      Reactions   Peanut-containing Drug Products Anaphylaxis   Carrot Oil Itching, Other (See Comments)   Throat itchs   Other    Green Beans      Medication List    STOP taking these medications    lidocaine-prilocaine cream Commonly known as:  EMLA     TAKE these medications   amLODipine 5 MG tablet Commonly known as:  NORVASC Take 1 tablet (5 mg total) by mouth daily. What changed:  when to take this   ketoconazole 2 % cream Commonly known as:  NIZORAL Apply 1 application topically daily.   ondansetron 4 MG tablet Commonly known as:  ZOFRAN Take 1 tablet (4 mg total) by mouth daily as needed for nausea or vomiting.     ASK your doctor about these medications   cephALEXin 500 MG capsule Commonly known as:  KEFLEX Take 1 capsule (500 mg total) by mouth 4 (four) times daily for 7 days. Ask about: Should I take this medication?      Follow-up Information    Dillingham, [name], DO. Go on 08/14/2018.   Specialty:  Plastic Surgery Contact information: [address] [place] [address] [phone]        Robert Bellow, MD. Go on 08/21/2018.   Specialties:  General Surgery, Radiology Why:  Tuesday November 19th at [facility] for a follow-up  Contact information: [place] [address] [phone]           Signed: Robert Bellow 08/20/2018, 8:27 PM

## 2018-08-21 ENCOUNTER — Encounter: Payer: Self-pay | Admitting: Plastic Surgery

## 2018-08-21 ENCOUNTER — Other Ambulatory Visit: Payer: Self-pay

## 2018-08-21 ENCOUNTER — Ambulatory Visit (INDEPENDENT_AMBULATORY_CARE_PROVIDER_SITE_OTHER): Payer: BC Managed Care – PPO | Admitting: General Surgery

## 2018-08-21 ENCOUNTER — Ambulatory Visit (INDEPENDENT_AMBULATORY_CARE_PROVIDER_SITE_OTHER): Payer: BC Managed Care – PPO | Admitting: Plastic Surgery

## 2018-08-21 ENCOUNTER — Encounter: Payer: Self-pay | Admitting: General Surgery

## 2018-08-21 VITALS — BP 144/93 | HR 62 | Temp 97.5°F | Resp 12 | Ht 63.0 in | Wt 162.0 lb

## 2018-08-21 DIAGNOSIS — C50411 Malignant neoplasm of upper-outer quadrant of right female breast: Secondary | ICD-10-CM

## 2018-08-21 DIAGNOSIS — Z170 Estrogen receptor positive status [ER+]: Secondary | ICD-10-CM

## 2018-08-21 DIAGNOSIS — Z9889 Other specified postprocedural states: Secondary | ICD-10-CM

## 2018-08-21 DIAGNOSIS — D0512 Intraductal carcinoma in situ of left breast: Secondary | ICD-10-CM

## 2018-08-21 NOTE — Progress Notes (Signed)
Patient ID: Kendra Harrington, female   DOB: Jun 10, 1971, 47 y.o.   MRN: [id number]  Chief Complaint  Patient presents with  . Routine Post Op    HPI Kendra Harrington is a 47 y.o. female.  Here for postoperative visit, bilateral mastectomy with tissue expanders. She is still having some pain, using tylenol, naprosyn.   HPI  Past Medical History:  Diagnosis Date  . BRCA negative 12/26/2017   [facility]   . Cancer ([place]) 11/20/2017   DUCTAL CARCINOMA IN SITU left breast  . GERD (gastroesophageal reflux disease)    OCC-NO MEDS  . Hypertension     Past Surgical History:  Procedure Laterality Date  . BREAST BIOPSY Right 02/21/2014   upper-outer calcs=atypical ductal hyperplasia and background of columnar cell change w/ calcifications. cytologic atypia per rad report from [place].  Marland Kitchen BREAST BIOPSY Right 02/21/2014   Central Right calcs=atypical ductal hyperplasia with atypical lobular hyperplasia in a background of columnar cell change with calcifications and papillary apocrine metaplasia per rad report from [place]  . BREAST BIOPSY Left 11/20/2017   Affirm Bx- DUCTAL CARCINOMA IN SITU  . BREAST BIOPSY Right 11/20/2017   Affirm Bx of 2 areas-  ATYPICAL DUCTAL HYPERPLASIA and PSEUDO-ANGIOMATOUS STROMAL HYPERPLASIA  . BREAST BIOPSY Bilateral 05/18/2018   Procedure: BREAST BIOPSY WITH NEEDLE LOCALIZATION;  Surgeon: Robert Bellow, MD;  Location: [facility] ORS;  Service: General;  Laterality: Bilateral;  . BREAST EXCISIONAL BIOPSY Right 2015   "Benign" per pt  . BREAST EXCISIONAL BIOPSY Left 05/18/2018   NL for  lumpectomy - DCIS  . BREAST EXCISIONAL BIOPSY Right 05/18/2018   NL for lumpectomy for 2 areas of atypical ductal hyperplasia   . BREAST RECONSTRUCTION WITH PLACEMENT OF TISSUE EXPANDER AND FLEX HD (ACELLULAR HYDRATED DERMIS) Bilateral 08/08/2018   Procedure: BREAST RECONSTRUCTION WITH PLACEMENT OF TISSUE EXPANDER AND FLEX HD (ACELLULAR HYDRATED DERMIS);  Surgeon: Wallace Going, DO;   Location: [facility] ORS;  Service: Plastics;  Laterality: Bilateral;  . CESAREAN SECTION    . MASTECTOMY W/ SENTINEL NODE BIOPSY Bilateral 08/08/2018   Procedure: MASTECTOMY WITH SENTINEL LYMPH NODE BIOPSY;  Surgeon: Robert Bellow, MD;  Location: [facility] ORS;  Service: General;  Laterality: Bilateral;    Family History  Problem Relation Age of Onset  . Hypertension Mother   . Stroke Father   . Breast cancer Neg Hx     Social History Social History   Tobacco Use  . Smoking status: Current Some Day Smoker    Years: 7.00    Types: E-cigarettes  . Smokeless tobacco: Never Used  . Tobacco comment: Social  Substance Use Topics  . Alcohol use: Yes    Alcohol/week: 3.0 standard drinks    Types: 3 Glasses of wine per week  . Drug use: No    Allergies  Allergen Reactions  . Peanut-Containing Drug Products Anaphylaxis  . Toradol [Ketorolac Tromethamine] Shortness Of Breath and Itching  . Carrot Oil Itching and Other (See Comments)    Throat itchs  . Other     Green Beans    Current Outpatient Medications  Medication Sig Dispense Refill  . acetaminophen (TYLENOL) 325 MG tablet Take 650 mg by mouth every 6 (six) hours as needed.    Marland Kitchen amLODipine (NORVASC) 5 MG tablet Take 1 tablet (5 mg total) by mouth daily. (Patient taking differently: Take 5 mg by mouth every morning. ) 90 tablet 1  . diazepam (VALIUM) 2 MG tablet Take 1 tablet (2 mg total) by mouth every  12 (twelve) hours as needed for up to 15 days for muscle spasms. 30 tablet 0  . ketoconazole (NIZORAL) 2 % cream Apply 1 application topically daily. 60 g 0  . naproxen (EC NAPROSYN) 500 MG EC tablet Take 1 tablet (500 mg total) by mouth 2 (two) times daily with a meal. 30 tablet 0  . ondansetron (ZOFRAN) 4 MG tablet Take 1 tablet (4 mg total) by mouth daily as needed for nausea or vomiting. 10 tablet 1   No current facility-administered medications for this visit.     Review of Systems Review of Systems  Constitutional:  Negative.   Respiratory: Negative.   Cardiovascular: Negative.     Blood pressure (!) 144/93, pulse 62, temperature (!) 97.5 F (36.4 C), temperature source Skin, resp. rate 12, height 5' 3" (1.6 m), weight 162 lb (73.5 kg), last menstrual period 07/29/2018, SpO2 99 %.  Physical Exam Physical Exam  Constitutional: She is oriented to person, place, and time. She appears well-developed and well-nourished.  HENT:  Mouth/Throat: Oropharynx is clear and moist.  Eyes: Conjunctivae are normal. No scleral icterus.  Neck: Neck supple.  Cardiovascular: Normal rate, regular rhythm and normal heart sounds.  Pulmonary/Chest: Effort normal and breath sounds normal.  Bilateral mastectomy incisions healing well, drains present    Lymphadenopathy:    She has no cervical adenopathy.    She has no axillary adenopathy.  Neurological: She is alert and oriented to person, place, and time.  Skin: Skin is warm and dry.  Psychiatric: Her behavior is normal.    Data Reviewed No additional invasive cancer on the right.  DCIS only on the left.  No lymph nodes were identified by technetium or methylene blue injection.  Assessment    Doing well s/p bilateral mastectomy and left SLN exam.    Plan    Follow up with Dr. Dillingham next week as scheduled.  Reminded to bring her drain sheets to all appointments.   Follow up one month The patient is aware to use a heating pad or ice pack as needed for comfort.  Recommended evaluation by medical oncology. Anticipate she is a candidate for antiestrogen therapy alone. Appointment with Medical Oncology will be scheduled.       HPI, Physical Exam, Assessment and Plan have been scribed under the direction and in the presence of Jeffrey W. Byrnett, MD. [name], RN  I have completed the exam and reviewed the above documentation for accuracy and completeness.  I agree with the above.  [employer] has been used and any errors in dictation or  transcription are unintentional.  Jeffrey Byrnett, M.D., F.A.C.S.  Jeffrey W Byrnett 08/22/2018, 8:38 PM   

## 2018-08-21 NOTE — Patient Instructions (Addendum)
The patient is aware to call back for any questions or new concerns. The patient is aware to use a heating pad or ice pack as needed for comfort. Follow up in one month Appointment with Medical Oncology

## 2018-08-21 NOTE — Progress Notes (Signed)
   Subjective:    Patient ID: Kendra Harrington, female    DOB: 01-Dec-1970, 47 y.o.   MRN: [id number]  The patient is a 47 year old black female here for HER-2 week follow-up after immediate breast reconstruction bilaterally.  She had expanders and flex HD placed.  She is still draining although minimal it is too soon for the drain removal it is serous in color.  Her flaps have good color and no sign of seroma, hematoma or infection.  She is managing the pain well.  She is requesting to become more active.  It looks like we will be able to remove her drains next week.   Review of Systems  Constitutional: Negative.   HENT: Negative.   Eyes: Negative.   Respiratory: Negative.   Genitourinary: Negative.   Neurological: Negative.        Objective:   Physical Exam  Constitutional: She is oriented to person, place, and time. She appears well-developed and well-nourished.  HENT:  Head: Normocephalic and atraumatic.  Eyes: Pupils are equal, round, and reactive to light. EOM are normal.  Cardiovascular: Normal rate.  Pulmonary/Chest: Effort normal.  Neurological: She is alert and oriented to person, place, and time.  Psychiatric: She has a normal mood and affect. Her behavior is normal. Judgment and thought content normal.      Assessment & Plan:  S/P breast reconstruction, bilateral  Malignant neoplasm of upper-outer quadrant of right breast in female, estrogen receptor positive ([facility])  We placed injectable saline in the Expander using a sterile technique: Right: 50 cc for a total of 200 / 300 cc Left: 50 cc for a total of 250 / 300 cc  Push with baby wipes and sit in the tub.  Just do not get the drain sites wet yet.

## 2018-08-26 NOTE — Progress Notes (Addendum)
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 DCIS of left breast.  INTERVAL HISTORY: Patient is a 47 year old female who initially underwent bilateral breast biopsy on November 20, 2017 after an abnormality noted on screening mammogram.  Left breast biopsy was consistent with DCIS, 2 separate right breast biopsies were negative for malignancy at that time.  Due to several issues, it appears patient was lost to follow-up.  She then underwent right and left breast wide excision on May 18, 2018.  Right breast pathology revealed 2 foci of invasive mammary carcinoma with focally positive DCIS at the anterior margin.  Left breast continue to reveal only DCIS.  Then on August 08, 2018, patient underwent bilateral mastectomy with both pathological specimens from right and left breast revealing residual DCIS.  Margins were clear at this time.  Patient has had no other treatments.  She is now undergone bilateral reconstruction as well and continues to have drains in her breasts.  She continues to feel well and remains asymptomatic.  She has no neurologic complaints.  She denies any recent fevers or illnesses.  She has a good appetite and denies weight loss.  She has no chest pain or shortness of breath.  She denies any nausea, vomiting, constipation, or diarrhea.  She has no urinary complaints.  Patient offers no further specific complaints today.  REVIEW OF SYSTEMS:   Review of Systems  Constitutional: Negative.  Negative for fever and malaise/fatigue.  Respiratory: Negative.  Negative for cough, hemoptysis and shortness of breath.   Cardiovascular: Negative.  Negative for chest pain and leg  swelling.  Gastrointestinal: Negative.  Negative for abdominal pain.  Genitourinary: Negative.  Negative for dysuria.  Musculoskeletal: Negative.  Negative for back pain.  Skin: Negative.  Negative for rash.  Neurological: Negative.  Negative for focal weakness, weakness and headaches.  Psychiatric/Behavioral: The patient is nervous/anxious.     As per HPI. Otherwise, a complete review of systems is negative.  PAST MEDICAL HISTORY: Past Medical History:  Diagnosis Date  . BRCA negative 12/26/2017   [facility]   . Cancer ([place]) 11/20/2017   DUCTAL CARCINOMA IN SITU left breast  . GERD (gastroesophageal reflux disease)    OCC-NO MEDS  . Hypertension     PAST SURGICAL HISTORY: Past Surgical History:  Procedure Laterality Date  . BREAST BIOPSY Right 02/21/2014   upper-outer calcs=atypical ductal hyperplasia and background of columnar cell change w/ calcifications. cytologic atypia per rad report from [place].  Marland Kitchen BREAST BIOPSY Right 02/21/2014   Central Right calcs=atypical ductal hyperplasia with atypical lobular hyperplasia in a background of columnar cell change with calcifications and papillary apocrine metaplasia per rad report from [place]  . BREAST BIOPSY Left 11/20/2017   Affirm Bx- DUCTAL CARCINOMA IN SITU  . BREAST BIOPSY Right 11/20/2017   Affirm Bx of 2 areas-  ATYPICAL DUCTAL HYPERPLASIA and PSEUDO-ANGIOMATOUS STROMAL HYPERPLASIA  . BREAST BIOPSY Bilateral 05/18/2018   Procedure: BREAST BIOPSY WITH NEEDLE LOCALIZATION;  Surgeon: Robert Bellow, MD;  Location: [facility] ORS;  Service: General;  Laterality: Bilateral;  . BREAST EXCISIONAL BIOPSY Right 2015   "Benign" per pt  . BREAST EXCISIONAL BIOPSY Left 05/18/2018   NL for  lumpectomy - DCIS  . BREAST EXCISIONAL BIOPSY  Right 05/18/2018   NL for lumpectomy for 2 areas of atypical ductal hyperplasia   . BREAST RECONSTRUCTION WITH PLACEMENT OF TISSUE EXPANDER AND FLEX HD (ACELLULAR HYDRATED DERMIS) Bilateral 08/08/2018    Procedure: BREAST RECONSTRUCTION WITH PLACEMENT OF TISSUE EXPANDER AND FLEX HD (ACELLULAR HYDRATED DERMIS);  Surgeon: Wallace Going, DO;  Location: [facility] ORS;  Service: Plastics;  Laterality: Bilateral;  . CESAREAN SECTION    . MASTECTOMY W/ SENTINEL NODE BIOPSY Bilateral 08/08/2018   Procedure: MASTECTOMY WITH SENTINEL LYMPH NODE BIOPSY;  Surgeon: Robert Bellow, MD;  Location: [facility] ORS;  Service: General;  Laterality: Bilateral;    FAMILY HISTORY: Family History  Problem Relation Age of Onset  . Hypertension Mother   . Stroke Father   . Breast cancer Neg Hx     ADVANCED DIRECTIVES (Y/N):  N  HEALTH MAINTENANCE: Social History   Tobacco Use  . Smoking status: Current Some Day Smoker    Years: 7.00    Types: E-cigarettes  . Smokeless tobacco: Never Used  . Tobacco comment: Social  Substance Use Topics  . Alcohol use: Yes    Alcohol/week: 3.0 standard drinks    Types: 3 Glasses of wine per week  . Drug use: No     Colonoscopy:  PAP:  Bone density:  Lipid panel:  Allergies  Allergen Reactions  . Peanut-Containing Drug Products Anaphylaxis  . Toradol [Ketorolac Tromethamine] Shortness Of Breath and Itching  . Carrot Oil Itching and Other (See Comments)    Throat itchs  . Other     Green Beans    Current Outpatient Medications  Medication Sig Dispense Refill  . acetaminophen (TYLENOL) 325 MG tablet Take 650 mg by mouth every 6 (six) hours as needed.    . naproxen (EC NAPROSYN) 500 MG EC tablet Take 1 tablet (500 mg total) by mouth 2 (two) times daily with a meal. 30 tablet 0  . amLODipine (NORVASC) 5 MG tablet Take 1 tablet (5 mg total) by mouth daily. (Patient not taking: Reported on 08/27/2018) 90 tablet 1  . ketoconazole (NIZORAL) 2 % cream Apply 1 application topically daily. (Patient not taking: Reported on 08/27/2018) 60 g 0  . ondansetron (ZOFRAN) 4 MG tablet Take 1 tablet (4 mg total) by mouth daily as needed for nausea or vomiting. (Patient not  taking: Reported on 08/27/2018) 10 tablet 1   No current facility-administered medications for this visit.     OBJECTIVE: Vitals:   08/27/18 1205  BP: (!) 146/89  Temp: 98.3 F (36.8 C)     Body mass index is 28.52 kg/m.    ECOG FS:0 - Asymptomatic  General: Well-developed, well-nourished, no acute distress. Eyes: Pink conjunctiva, anicteric sclera. HEENT: Normocephalic, moist mucous membranes, clear oropharnyx. Breast: Surgical drains in place. Lungs: Clear to auscultation bilaterally. Heart: Regular rate and rhythm. No rubs, murmurs, or gallops. Abdomen: Soft, nontender, nondistended. No organomegaly noted, normoactive bowel sounds. Musculoskeletal: No edema, cyanosis, or clubbing. Neuro: Alert, answering all questions appropriately. Cranial nerves grossly intact. Skin: No rashes or petechiae noted. Psych: Normal affect. Lymphatics: No cervical, calvicular, axillary or inguinal LAD.   LAB RESULTS:  Lab Results  Component Value Date   NA 142 07/31/2018   K 4.4 07/31/2018   CL 104 07/31/2018   CO2 24 07/31/2018   GLUCOSE 86 07/31/2018   BUN 17 07/31/2018   CREATININE 1.01 (H) 07/31/2018   CALCIUM 9.3 07/31/2018   GFRNONAA 66 07/31/2018   GFRAA 76 07/31/2018    No results  found for: WBC, NEUTROABS, HGB, HCT, MCV, PLT   STUDIES: Nm Sentinel Node Injection  Result Date: 08/08/2018 CLINICAL DATA:  Bilateral breast cancer. EXAM: NUCLEAR MEDICINE BREAST LYMPHOSCINTIGRAPHY OF BOTH BREASTS. TECHNIQUE: Intradermal injection of radiopharmaceutical was performed at the 12 o'clock, 3 o'clock, 6 o'clock, and 9 o'clock positions around the left nipple. Intradermal injection of radiopharmaceutical was performed at the 12 o'clock, 3 o'clock, 6 o'clock, and 9 o'clock positions around the right nipple. The patient was then sent to the operating room where the sentinel node(s) were identified and removed by the surgeon. RADIOPHARMACEUTICALS:  Total of 1 mCi Millipore-filtered  Technetium-1msulfur colloid, injected in four aliquots of 0.25 mCi each around the left nipple. Total of 1 mCi Millipore-filtered Technetium-947mulfur colloid, injected in four aliquots of 0.25 mCi each around the right nipple. IMPRESSION: Uncomplicated intradermal injection of a total of 1 mCi Technetium-9984mlfur colloid about each nipple for purposes of sentinel node identification. Electronically Signed   By: JohSandi MariscalD.   On: 08/08/2018 12:08   Nm Sentinel Node Injection  Result Date: 08/08/2018 CLINICAL DATA:  Bilateral breast cancer. EXAM: NUCLEAR MEDICINE BREAST LYMPHOSCINTIGRAPHY OF BOTH BREASTS. TECHNIQUE: Intradermal injection of radiopharmaceutical was performed at the 12 o'clock, 3 o'clock, 6 o'clock, and 9 o'clock positions around the left nipple. Intradermal injection of radiopharmaceutical was performed at the 12 o'clock, 3 o'clock, 6 o'clock, and 9 o'clock positions around the right nipple. The patient was then sent to the operating room where the sentinel node(s) were identified and removed by the surgeon. RADIOPHARMACEUTICALS:  Total of 1 mCi Millipore-filtered Technetium-75m44mfur colloid, injected in four aliquots of 0.25 mCi each around the left nipple. Total of 1 mCi Millipore-filtered Technetium-75m 61mur colloid, injected in four aliquots of 0.25 mCi each around the right nipple. IMPRESSION: Uncomplicated intradermal injection of a total of 1 mCi Technetium-75m s24mr colloid about each nipple for purposes of sentinel node identification. 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 DCIS of left breast  PLAN:    1.  Pathologic stage Ia ER/PR positive, HER-2 negative invasive carcinoma of the upper outer quadrant of the right breast: See patient surgical history above.  The patient only had a small invasive  component, but given her young age and bilateral lesions will send Oncotype DX for further evaluation.  Genetic testing was reported as negative.  She does not require adjuvant XRT given the fact that she had bilateral mastectomies.  Patient was given a prescription for tamoxifen which she will take for a minimum of 5 years completing treatment in November 2024.  Will consider extending treatment given her young age and bilateral lesions.  Return to clinic in 3 months for further evaluation. 2. DCIS of left breast: Tamoxifen as above. 3.  Breast reconstruction: Continue follow-up with plastic surgery as scheduled.  Patient expressed understanding and was in agreement with this plan. She also understands that She can call clinic at any time with any questions, concerns, or complaints.   Cancer Staging Malignant neoplasm of upper-outer quadrant of right breast in female, estrogen receptor positive (HCC) SAthensing form: Breast, AJCC 8th Edition - Clinical stage from 08/26/2018: Stage IA (cT1a, cN0, cM0, G1, ER+, PR+, HER2-) - Signed by FinnegLloyd Hugern 08/26/2018   TimothLloyd Huger 09/02/2018 8:55 AM  Addendum: Insufficient  tissue for further testing with Oncotype DX.  Proceed with tamoxifen as above.   Lloyd Huger, MD 09/16/18 6:45 AM

## 2018-08-27 ENCOUNTER — Other Ambulatory Visit: Payer: Self-pay

## 2018-08-27 ENCOUNTER — Inpatient Hospital Stay: Payer: BC Managed Care – PPO | Attending: Oncology | Admitting: Oncology

## 2018-08-27 VITALS — BP 146/89 | Temp 98.3°F | Ht 63.0 in | Wt 161.0 lb

## 2018-08-27 DIAGNOSIS — Z170 Estrogen receptor positive status [ER+]: Secondary | ICD-10-CM | POA: Diagnosis not present

## 2018-08-27 DIAGNOSIS — Z720 Tobacco use: Secondary | ICD-10-CM | POA: Insufficient documentation

## 2018-08-27 DIAGNOSIS — D0512 Intraductal carcinoma in situ of left breast: Secondary | ICD-10-CM | POA: Diagnosis present

## 2018-08-27 DIAGNOSIS — Z9013 Acquired absence of bilateral breasts and nipples: Secondary | ICD-10-CM | POA: Insufficient documentation

## 2018-08-27 DIAGNOSIS — C50411 Malignant neoplasm of upper-outer quadrant of right female breast: Secondary | ICD-10-CM

## 2018-08-27 NOTE — Progress Notes (Signed)
Patient is here for a consult for her right breast cancer. Patient stated that she stays in pain ever since she had her surgery on 08/08/2018 by Dr. Bary Castilla of her right breast mastectomy. Patient stated that she is constantly in pain but is only able to take Tylenol since she was not given any more narcotics. Patient stated that she "stays cold" all the time. Patient denied any nausea, vomiting, fever.

## 2018-08-28 ENCOUNTER — Encounter: Payer: Self-pay | Admitting: Plastic Surgery

## 2018-08-28 ENCOUNTER — Ambulatory Visit: Payer: BC Managed Care – PPO | Admitting: Plastic Surgery

## 2018-09-10 ENCOUNTER — Encounter: Payer: Self-pay | Admitting: Oncology

## 2018-09-11 ENCOUNTER — Encounter: Payer: Self-pay | Admitting: Plastic Surgery

## 2018-09-11 ENCOUNTER — Encounter: Payer: Self-pay | Admitting: Oncology

## 2018-09-11 ENCOUNTER — Ambulatory Visit (INDEPENDENT_AMBULATORY_CARE_PROVIDER_SITE_OTHER): Payer: BC Managed Care – PPO | Admitting: Plastic Surgery

## 2018-09-11 VITALS — BP 110/80 | HR 68 | Ht 64.0 in | Wt 158.8 lb

## 2018-09-11 DIAGNOSIS — Z170 Estrogen receptor positive status [ER+]: Secondary | ICD-10-CM

## 2018-09-11 DIAGNOSIS — Z9889 Other specified postprocedural states: Secondary | ICD-10-CM

## 2018-09-11 DIAGNOSIS — C50411 Malignant neoplasm of upper-outer quadrant of right female breast: Secondary | ICD-10-CM

## 2018-09-11 NOTE — Progress Notes (Signed)
   Subjective:    Patient ID: Shireen Rayburn, female    DOB: 07/30/1971, 47 y.o.   MRN: [id number]  Ms. Zachman is a 47 year old black female here with her husband for follow-up on bilateral breast reconstruction.  Her incisions are healing well.  There is no sign of seroma or hematoma.  The drains are out.  The drain sites have healed nicely.  She notices some firmness to the expander which is typical.  No other concerns.   Review of Systems  Constitutional: Negative.   HENT: Negative.   Respiratory: Negative.   Gastrointestinal: Negative for abdominal pain.  Endocrine: Negative.   Genitourinary: Negative.   Musculoskeletal: Negative.   Skin: Negative.   Neurological: Negative.   Psychiatric/Behavioral: Negative.       Objective:   Physical Exam  Constitutional: She is oriented to person, place, and time. She appears well-developed and well-nourished.  HENT:  Head: Normocephalic and atraumatic.  Eyes: Pupils are equal, round, and reactive to light. EOM are normal.  Cardiovascular: Normal rate.  Pulmonary/Chest: Effort normal.  Neurological: She is alert and oriented to person, place, and time.  Psychiatric: She has a normal mood and affect. Her behavior is normal. Thought content normal.       Assessment & Plan:  Malignant neoplasm of upper-outer quadrant of right breast in female, estrogen receptor positive (HCC)  S/P breast reconstruction, bilateral  We placed injectable saline in the Expander using a sterile technique: Right: 50 cc for a total of 250 / 300 cc Left: 50 cc for a total of 300 / 300 cc  Plan for follow up in 2 weeks.

## 2018-09-20 ENCOUNTER — Encounter: Payer: Self-pay | Admitting: General Surgery

## 2018-09-20 ENCOUNTER — Ambulatory Visit (INDEPENDENT_AMBULATORY_CARE_PROVIDER_SITE_OTHER): Payer: BC Managed Care – PPO | Admitting: General Surgery

## 2018-09-20 ENCOUNTER — Telehealth: Payer: Self-pay | Admitting: *Deleted

## 2018-09-20 ENCOUNTER — Other Ambulatory Visit: Payer: Self-pay

## 2018-09-20 VITALS — BP 148/97 | HR 85 | Temp 97.5°F | Resp 13 | Ht 64.0 in | Wt 160.0 lb

## 2018-09-20 DIAGNOSIS — C50411 Malignant neoplasm of upper-outer quadrant of right female breast: Secondary | ICD-10-CM

## 2018-09-20 DIAGNOSIS — Z170 Estrogen receptor positive status [ER+]: Secondary | ICD-10-CM

## 2018-09-20 NOTE — Telephone Encounter (Signed)
-----   Message from Robert Bellow, MD sent at 09/20/2018 12:01 PM EST ----- Notify her that the lab in [place] reported no tumor to test.  Dr. Grayland Ormond says that she can start the Tamoxifen medication that he sent to her pharmacy. One tablet daily. I would ask that she takes a baby aspirin with it as well. Thanks.

## 2018-09-20 NOTE — Patient Instructions (Addendum)
Return in two months.The patient is aware to call back for any questions or concerns. 

## 2018-09-20 NOTE — Progress Notes (Signed)
Patient ID: Kendra Harrington, female   DOB: [birth year], 47 y.o.   MRN: [id number]  Chief Complaint  Patient presents with  . Follow-up    bilateral mastectomy    HPI Kendra Harrington is a 47 y.o. female Here for postoperative visit, bilateral mastectomy with tissue expanders. HPI  Past Medical History:  Diagnosis Date  . BRCA negative 12/26/2017   [facility]   . Cancer ([place]) 11/20/2017   DUCTAL CARCINOMA IN SITU left breast  . GERD (gastroesophageal reflux disease)    OCC-NO MEDS  . Hypertension     Past Surgical History:  Procedure Laterality Date  . BREAST BIOPSY Right 02/21/2014   upper-outer calcs=atypical ductal hyperplasia and background of columnar cell change w/ calcifications. cytologic atypia per rad report from [place].  Marland Kitchen BREAST BIOPSY Right 02/21/2014   Central Right calcs=atypical ductal hyperplasia with atypical lobular hyperplasia in a background of columnar cell change with calcifications and papillary apocrine metaplasia per rad report from [place]  . BREAST BIOPSY Left 11/20/2017   Affirm Bx- DUCTAL CARCINOMA IN SITU  . BREAST BIOPSY Right 11/20/2017   Affirm Bx of 2 areas-  ATYPICAL DUCTAL HYPERPLASIA and PSEUDO-ANGIOMATOUS STROMAL HYPERPLASIA  . BREAST BIOPSY Bilateral 05/18/2018   Procedure: BREAST BIOPSY WITH NEEDLE LOCALIZATION;  Surgeon: Robert Bellow, MD;  Location: [facility] ORS;  Service: General;  Laterality: Bilateral;  . BREAST EXCISIONAL BIOPSY Right 2015   "Benign" per pt  . BREAST EXCISIONAL BIOPSY Left 05/18/2018   NL for  lumpectomy - DCIS  . BREAST EXCISIONAL BIOPSY Right 05/18/2018   NL for lumpectomy for 2 areas of atypical ductal hyperplasia   . BREAST RECONSTRUCTION WITH PLACEMENT OF TISSUE EXPANDER AND FLEX HD (ACELLULAR HYDRATED DERMIS) Bilateral 08/08/2018   Procedure: BREAST RECONSTRUCTION WITH PLACEMENT OF TISSUE EXPANDER AND FLEX HD (ACELLULAR HYDRATED DERMIS);  Surgeon: Wallace Going, DO;  Location: [facility] ORS;  Service: Plastics;   Laterality: Bilateral;  . CESAREAN SECTION    . MASTECTOMY W/ SENTINEL NODE BIOPSY Bilateral 08/08/2018   Procedure: MASTECTOMY WITH SENTINEL LYMPH NODE BIOPSY;  Surgeon: Robert Bellow, MD;  Location: [facility] ORS;  Service: General;  Laterality: Bilateral;    Family History  Problem Relation Age of Onset  . Hypertension Mother   . Stroke Father   . Breast cancer Neg Hx     Social History Social History   Tobacco Use  . Smoking status: Current Some Day Smoker    Years: 7.00    Types: E-cigarettes  . Smokeless tobacco: Never Used  . Tobacco comment: Social  Substance Use Topics  . Alcohol use: Yes    Alcohol/week: 3.0 standard drinks    Types: 3 Glasses of wine per week  . Drug use: No    Allergies  Allergen Reactions  . Peanut-Containing Drug Products Anaphylaxis  . Toradol [Ketorolac Tromethamine] Shortness Of Breath and Itching  . Carrot Oil Itching and Other (See Comments)    Throat itchs  . Other     Green Beans    Current Outpatient Medications  Medication Sig Dispense Refill  . acetaminophen (TYLENOL) 325 MG tablet Take 650 mg by mouth every 6 (six) hours as needed.    Marland Kitchen amLODipine (NORVASC) 5 MG tablet Take 1 tablet (5 mg total) by mouth daily. 90 tablet 1  . ketoconazole (NIZORAL) 2 % cream Apply 1 application topically daily. 60 g 0  . naproxen (EC NAPROSYN) 500 MG EC tablet Take 1 tablet (500 mg total) by mouth 2 (two) times daily  with a meal. 30 tablet 0  . ondansetron (ZOFRAN) 4 MG tablet Take 1 tablet (4 mg total) by mouth daily as needed for nausea or vomiting. 10 tablet 1   No current facility-administered medications for this visit.     Review of Systems Review of Systems  Constitutional: Negative.   Respiratory: Negative.   Cardiovascular: Negative.     Blood pressure (!) 148/97, pulse 85, temperature (!) 97.5 F (36.4 C), temperature source Skin, resp. rate 13, height _0  (1.626 m), weight 160 lb (72.6 kg), SpO2 98 %.  Physical  Exam Physical Exam Neck:     Musculoskeletal: Neck supple.  Cardiovascular:     Rate and Rhythm: Normal rate and regular rhythm.  Chest:    Skin:    General: Skin is warm and dry.  Neurological:     Mental Status: She is alert and oriented to person, place, and time.     Data Reviewed Medical oncology note of August 27, 2018 reviewed.  Oncotype DX testing requested.  Laboratory review shows on September 11, 2018 Oncotype reported no tumor available for assay.  Pathology confirmed no additional tumor available.  This information became available after the patient had left the office.  Assessment    Doing well post bilateral mastectomy and expander placement.  Candidate for antiestrogen therapy.    Plan  Return in two months. The patient is aware to call back for any questions or concerns.  I have sent a message to Dr. Grayland Ormond so he can contact the patient and instruct her regarding initiation of tamoxifen.   HPI, Physical Exam, Assessment and Plan have been scribed under the direction and in the presence of Hervey Ard, MD.  Gaspar Cola, CMA  I have completed the exam and reviewed the above documentation for accuracy and completeness.  I agree with the above.  Haematologist has been used and any errors in dictation or transcription are unintentional.  Hervey Ard, M.D., F.A.C.S.  Forest Gleason Isaah Furry 09/20/2018, 10:23 AM

## 2018-09-21 ENCOUNTER — Other Ambulatory Visit: Payer: Self-pay | Admitting: *Deleted

## 2018-09-21 ENCOUNTER — Telehealth: Payer: Self-pay | Admitting: *Deleted

## 2018-09-21 MED ORDER — TAMOXIFEN CITRATE 20 MG PO TABS: 20.0000 mg | ORAL_TABLET | Freq: Every day | ORAL | 3 refills | Status: DC

## 2018-09-21 NOTE — Telephone Encounter (Signed)
States she was to have a pill for her cancer, but the pharmacy does not have it. Per office note 08/27/18, she was to be given a prescription for Tamoxifen to take for 5 years. Please advise

## 2018-09-21 NOTE — Telephone Encounter (Signed)
RX sent to patients pharmacy

## 2018-09-21 NOTE — Telephone Encounter (Signed)
Notified patient as instructed, patient pleased. Discussed follow-up appointments, patient agrees. She is aware to take the 81 mg aspirin daily while she is on tamoxifen.

## 2018-09-25 ENCOUNTER — Ambulatory Visit (INDEPENDENT_AMBULATORY_CARE_PROVIDER_SITE_OTHER): Payer: BC Managed Care – PPO | Admitting: Plastic Surgery

## 2018-09-25 ENCOUNTER — Encounter: Payer: Self-pay | Admitting: Plastic Surgery

## 2018-09-25 VITALS — BP 128/72 | HR 72 | Ht 64.0 in | Wt 161.0 lb

## 2018-09-25 DIAGNOSIS — Z9889 Other specified postprocedural states: Secondary | ICD-10-CM

## 2018-09-25 DIAGNOSIS — Z170 Estrogen receptor positive status [ER+]: Secondary | ICD-10-CM

## 2018-09-25 DIAGNOSIS — C50411 Malignant neoplasm of upper-outer quadrant of right female breast: Secondary | ICD-10-CM

## 2018-09-25 NOTE — Progress Notes (Signed)
   Subjective:    Patient ID: Kendra Harrington, female    DOB: [birth year], 47 y.o.   MRN: [id number]  Kendra Harrington is a 47 year old black female here with her husband for follow-up on her bilateral breast reconstruction.  She had mastectomies with immediate reconstruction using expanders and flex HD.  She is been doing very well.  She is in a very good mood today like usual.  There is no sign of seroma, hematoma or infection.  The right side is still a little bit tighter than the left side.   Review of Systems  Constitutional: Negative.   HENT: Negative.   Respiratory: Negative.   Cardiovascular: Negative.   Gastrointestinal: Negative.   Endocrine: Negative.   Genitourinary: Negative.   Musculoskeletal: Negative.        Objective:   Physical Exam Vitals signs and nursing note reviewed.  HENT:     Head: Normocephalic.  Cardiovascular:     Rate and Rhythm: Normal rate.  Skin:    General: Skin is warm.  Neurological:     Mental Status: She is alert.  Psychiatric:        Mood and Affect: Mood normal.        Thought Content: Thought content normal.        Judgment: Judgment normal.       Assessment & Plan:  Malignant neoplasm of upper-outer quadrant of right breast in female, estrogen receptor positive (HCC)  S/P breast reconstruction, bilateral We placed injectable saline in the Expander using a sterile technique: Right: 50 cc for a total of 300 / 300 cc Left: 50 cc for a total of 350 / 300 cc

## 2018-10-09 ENCOUNTER — Ambulatory Visit (INDEPENDENT_AMBULATORY_CARE_PROVIDER_SITE_OTHER): Payer: Self-pay | Admitting: Plastic Surgery

## 2018-10-09 ENCOUNTER — Encounter: Payer: Self-pay | Admitting: Plastic Surgery

## 2018-10-09 VITALS — BP 144/90 | HR 70 | Temp 98.3°F | Ht 64.0 in | Wt 158.0 lb

## 2018-10-09 DIAGNOSIS — Z9889 Other specified postprocedural states: Secondary | ICD-10-CM

## 2018-10-09 DIAGNOSIS — Z170 Estrogen receptor positive status [ER+]: Secondary | ICD-10-CM

## 2018-10-09 DIAGNOSIS — C50411 Malignant neoplasm of upper-outer quadrant of right female breast: Secondary | ICD-10-CM

## 2018-10-09 NOTE — Progress Notes (Signed)
   Subjective:    Patient ID: Kendra Harrington, female    DOB: 23-Oct-1970, 48 y.o.   MRN: [id number]  Mrs. Kiger is a 48 year old black female here with her husband for follow-up on her bilateral breast reconstruction.  Her expanders are in.  She has 300 cc in the right expander and 350 cc in the left expander.  The right expander is a little bit tight today.  There is no sign of a hematoma seroma.  The skin incisions are healing well.  She is pleased with her progress.    Review of Systems  Constitutional: Negative.   HENT: Negative.   Eyes: Negative.   Respiratory: Negative.   Cardiovascular: Negative.   Gastrointestinal: Negative.   Endocrine: Negative.   Genitourinary: Negative.   Musculoskeletal: Negative.   Skin: Negative.  Negative for color change and wound.       Objective:   Physical Exam Vitals signs and nursing note reviewed.  Constitutional:      Appearance: Normal appearance.  HENT:     Head: Normocephalic.  Cardiovascular:     Rate and Rhythm: Normal rate.  Pulmonary:     Effort: Pulmonary effort is normal.  Abdominal:     General: Abdomen is flat.  Neurological:     General: No focal deficit present.     Mental Status: She is alert.  Psychiatric:        Mood and Affect: Mood normal.        Thought Content: Thought content normal.        Judgment: Judgment normal.        Assessment & Plan:  S/P breast reconstruction, bilateral  Malignant neoplasm of upper-outer quadrant of right breast in female, estrogen receptor positive ([place]) We will plan to expand at her next visit which will be in 2 weeks.  She would like to shoot for her exchange surgery the last week of February.

## 2018-10-23 ENCOUNTER — Encounter: Payer: Self-pay | Admitting: Plastic Surgery

## 2018-10-23 ENCOUNTER — Ambulatory Visit (INDEPENDENT_AMBULATORY_CARE_PROVIDER_SITE_OTHER): Payer: BLUE CROSS/BLUE SHIELD | Admitting: Plastic Surgery

## 2018-10-23 VITALS — BP 140/80 | HR 70 | Temp 98.0°F | Resp 20 | Ht 64.0 in | Wt 157.0 lb

## 2018-10-23 DIAGNOSIS — C50411 Malignant neoplasm of upper-outer quadrant of right female breast: Secondary | ICD-10-CM

## 2018-10-23 DIAGNOSIS — Z9889 Other specified postprocedural states: Secondary | ICD-10-CM

## 2018-10-23 DIAGNOSIS — Z170 Estrogen receptor positive status [ER+]: Secondary | ICD-10-CM

## 2018-10-23 NOTE — Progress Notes (Signed)
   Subjective:    Patient ID: Kendra Harrington, female    DOB: [birth year], 48 y.o.   MRN: [id number]  The patient is a 48 year old female here with her husband for follow-up on her bilateral breast reconstruction.  She is pleasant and smiling as usual.  The right side looks bigger than the left as it has for several weeks now.  There is no sign of infection or seroma.  The incisions are healing well.  The right side is a little bit tighter than the left today.     Review of Systems  Constitutional: Negative.   HENT: Negative.   Eyes: Negative.   Respiratory: Negative.   Gastrointestinal: Negative.   Genitourinary: Negative.   Musculoskeletal: Negative.   Skin: Negative.        Objective:   Physical Exam Vitals signs and nursing note reviewed.  Constitutional:      Appearance: Normal appearance.  HENT:     Head: Normocephalic.  Cardiovascular:     Rate and Rhythm: Normal rate.  Pulmonary:     Effort: Pulmonary effort is normal.  Abdominal:     General: There is no distension.  Neurological:     Mental Status: She is alert.  Psychiatric:        Mood and Affect: Mood normal.        Thought Content: Thought content normal.        Judgment: Judgment normal.         Assessment & Plan:  S/P breast reconstruction, bilateral  Malignant neoplasm of upper-outer quadrant of right breast in female, estrogen receptor positive ([place]) We placed injectable saline in the Expander using a sterile technique: Left: 50 cc for a total of 400 / 300 cc only  Plan for more expansion next week.

## 2018-10-30 ENCOUNTER — Ambulatory Visit (INDEPENDENT_AMBULATORY_CARE_PROVIDER_SITE_OTHER): Payer: BLUE CROSS/BLUE SHIELD | Admitting: Physician Assistant

## 2018-10-30 ENCOUNTER — Encounter: Payer: Self-pay | Admitting: Physician Assistant

## 2018-10-30 VITALS — BP 145/103 | HR 72 | Temp 98.4°F | Ht 64.0 in | Wt 153.0 lb

## 2018-10-30 DIAGNOSIS — Z9889 Other specified postprocedural states: Secondary | ICD-10-CM

## 2018-10-30 DIAGNOSIS — Z170 Estrogen receptor positive status [ER+]: Secondary | ICD-10-CM

## 2018-10-30 DIAGNOSIS — C50411 Malignant neoplasm of upper-outer quadrant of right female breast: Secondary | ICD-10-CM

## 2018-10-30 NOTE — Progress Notes (Signed)
  Subjective:     Patient ID: Kendra Harrington, female   DOB: 1971-01-30, 48 y.o.   MRN: [id number]  HPI Very pleasant 48 year old female pt presents to the clinic s/p Mastectomy and Expander placement on 08/08/18.  Pt reports doing well.  Pt and her husband still note that the right side looks larger than the left.  She denies discomfort after the last fill.  She denies hx of radiation.  The pt has been very compliant with clinic visits and demonstrating healthy patterns to aid in healing.  Review of Systems  Constitutional: Negative.   Respiratory: Negative.   Cardiovascular: Negative.   Gastrointestinal: Negative.   Skin: Negative.        Objective:   Physical Exam Constitutional:      Appearance: Normal appearance.  Pulmonary:     Effort: Pulmonary effort is normal.  Abdominal:     Palpations: Abdomen is soft.  Skin:    General: Skin is warm and dry.  Neurological:     Mental Status: She is alert and oriented to person, place, and time.  Psychiatric:        Mood and Affect: Mood normal.        Behavior: Behavior normal.        Thought Content: Thought content normal.        Judgment: Judgment normal.        Assessment:     S/p mastectomy and expander placement    Plan:     Dr. Marla Roe spoke to the pt and her husband about the asymmetry.  She told them it is possible there is a slow leak or the expander is pushing against a rib.  Today we only filled the left breast expander We placed injectable saline in the Expander using a sterile technique: Right: 0 cc for a total of 300 / 300 cc Left: 80 cc for a total of 480 / 300 cc  Pt will return in one week to see if we note a reduction in size of the left breast.  Dr. Marla Roe said the pt may need to  make a decision if she wanted to move for with placement of implants or if she wants to put in a new expander.

## 2018-11-01 ENCOUNTER — Telehealth: Payer: Self-pay

## 2018-11-01 NOTE — Telephone Encounter (Signed)
Telephone call from pt to inform  Dr. Marla Roe that she has observed that her left breast tissue expander has deflated.  She denies any pain, no difficulty breathing or other symptoms.  She has an appointment with Dr. Marla Roe on 11/06/2018  I forwarded her concern to D. Dillingham, who will see her on her next appointment & will discuss her options for further care then.  [place], RNFA

## 2018-11-05 NOTE — Telephone Encounter (Signed)
Pt agrees with olan of care- to see Dr. Marla Roe o 11/06/18 for further options  [place], RNFA

## 2018-11-06 ENCOUNTER — Encounter: Payer: Self-pay | Admitting: Plastic Surgery

## 2018-11-06 ENCOUNTER — Ambulatory Visit (INDEPENDENT_AMBULATORY_CARE_PROVIDER_SITE_OTHER): Payer: BLUE CROSS/BLUE SHIELD | Admitting: Plastic Surgery

## 2018-11-06 VITALS — BP 138/92 | HR 66 | Temp 97.8°F | Ht 64.0 in | Wt 153.0 lb

## 2018-11-06 DIAGNOSIS — Z170 Estrogen receptor positive status [ER+]: Secondary | ICD-10-CM

## 2018-11-06 DIAGNOSIS — Z9889 Other specified postprocedural states: Secondary | ICD-10-CM

## 2018-11-06 DIAGNOSIS — C50411 Malignant neoplasm of upper-outer quadrant of right female breast: Secondary | ICD-10-CM

## 2018-11-06 NOTE — Progress Notes (Signed)
   Subjective:    Patient ID: Kendra Harrington, female    DOB: August 25, 1971, 48 y.o.   MRN: [id number]  The patient is a 48 year old female here with her husband for follow-up on her bilateral breast expanders.  We have determined at this point that she does have a leak on the left side.  She retains the fluid for about 3 days and then it seems to deflate but not completely.  She is okay with being around the size but would like to try and go just a little bit bigger on the right.  There is no sign of infection no seroma or hematoma.  Her incisions are well.   Review of Systems  Constitutional: Negative.   HENT: Negative.   Eyes: Negative.   Respiratory: Negative.   Gastrointestinal: Negative.   Musculoskeletal: Negative.   Skin: Negative.        Objective:   Physical Exam Vitals signs and nursing note reviewed.  Constitutional:      Appearance: Normal appearance.  HENT:     Head: Normocephalic and atraumatic.     Nose: Nose normal.  Eyes:     Extraocular Movements: Extraocular movements intact.  Cardiovascular:     Rate and Rhythm: Normal rate.  Pulmonary:     Effort: Pulmonary effort is normal.  Abdominal:     General: Abdomen is flat.  Neurological:     Mental Status: She is alert.  Psychiatric:        Mood and Affect: Mood normal.        Thought Content: Thought content normal.        Judgment: Judgment normal.        Assessment & Plan:  S/P breast reconstruction, bilateral  Malignant neoplasm of upper-outer quadrant of right breast in female, estrogen receptor positive ([place])   We placed injectable saline in the Expander using a sterile technique: Right: 50 cc for a total of 350 / 300 cc Left: 80 cc for a total of leak / 300 cc  We will continue to keep the left side expanded and go a little bit bigger on the right.  Her skin is thin so we are reaching the limit.  She would like to move towards the exchange to implants on both sides.  We will request an OR date.

## 2018-11-13 ENCOUNTER — Encounter: Payer: Self-pay | Admitting: Plastic Surgery

## 2018-11-13 ENCOUNTER — Ambulatory Visit (INDEPENDENT_AMBULATORY_CARE_PROVIDER_SITE_OTHER): Payer: BLUE CROSS/BLUE SHIELD | Admitting: Plastic Surgery

## 2018-11-13 VITALS — BP 158/94 | HR 66 | Temp 98.3°F | Ht 64.0 in | Wt 153.0 lb

## 2018-11-13 DIAGNOSIS — Z170 Estrogen receptor positive status [ER+]: Secondary | ICD-10-CM

## 2018-11-13 DIAGNOSIS — Z9889 Other specified postprocedural states: Secondary | ICD-10-CM

## 2018-11-13 DIAGNOSIS — C50411 Malignant neoplasm of upper-outer quadrant of right female breast: Secondary | ICD-10-CM

## 2018-11-13 NOTE — Progress Notes (Signed)
   Subjective:    Patient ID: Kendra Harrington, female    DOB: 06/09/1971, 48 y.o.   MRN: [id number]  Kendra Harrington is a 48 year old female here with her husband for follow-up on her breast reconstruction.  She clearly has a leak of the left breast implant.  The right seems to be doing well.  I do not think we can expand the right side anymore safely.  The patient understands.  The left side deflates and in 2 or 3 days.  We put 100 cc in today.   Review of Systems  Constitutional: Negative.   HENT: Negative.   Eyes: Negative.   Respiratory: Negative.   Gastrointestinal: Negative.   Genitourinary: Negative.   Skin: Negative.        Objective:   Physical Exam Vitals signs and nursing note reviewed.  Constitutional:      Appearance: Normal appearance.  HENT:     Head: Normocephalic and atraumatic.  Cardiovascular:     Rate and Rhythm: Normal rate.  Pulmonary:     Effort: Pulmonary effort is normal.  Abdominal:     General: Abdomen is flat.  Neurological:     Mental Status: She is alert.  Psychiatric:        Mood and Affect: Mood normal.        Thought Content: Thought content normal.        Judgment: Judgment normal.       Assessment & Plan:  S/P breast reconstruction, bilateral  Malignant neoplasm of upper-outer quadrant of right breast in female, estrogen receptor positive ([place])  Plan on exchange of bilateral expanders for breast implants. Working on scheduling a date now.

## 2018-11-20 ENCOUNTER — Encounter (HOSPITAL_BASED_OUTPATIENT_CLINIC_OR_DEPARTMENT_OTHER): Payer: Self-pay | Admitting: *Deleted

## 2018-11-20 ENCOUNTER — Other Ambulatory Visit: Payer: Self-pay

## 2018-11-23 ENCOUNTER — Ambulatory Visit (INDEPENDENT_AMBULATORY_CARE_PROVIDER_SITE_OTHER): Payer: Self-pay | Admitting: Plastic Surgery

## 2018-11-23 ENCOUNTER — Encounter: Payer: Self-pay | Admitting: Plastic Surgery

## 2018-11-23 VITALS — BP 140/90 | HR 69 | Temp 98.5°F | Wt 158.0 lb

## 2018-11-23 DIAGNOSIS — Z9013 Acquired absence of bilateral breasts and nipples: Secondary | ICD-10-CM

## 2018-11-23 DIAGNOSIS — Z9889 Other specified postprocedural states: Secondary | ICD-10-CM

## 2018-11-23 MED ORDER — HYDROCODONE-ACETAMINOPHEN 5-325 MG PO TABS: 1.0000 | ORAL_TABLET | ORAL | 0 refills | Status: DC | PRN

## 2018-11-23 MED ORDER — ONDANSETRON HCL 4 MG PO TABS: 4.0000 mg | ORAL_TABLET | Freq: Three times a day (TID) | ORAL | 0 refills | Status: DC | PRN

## 2018-11-23 MED ORDER — CEPHALEXIN 500 MG PO CAPS: 500.0000 mg | ORAL_CAPSULE | Freq: Four times a day (QID) | ORAL | 0 refills | Status: DC

## 2018-11-23 MED ORDER — DIAZEPAM 2 MG PO TABS: 2.0000 mg | ORAL_TABLET | Freq: Four times a day (QID) | ORAL | 0 refills | Status: DC | PRN

## 2018-11-23 NOTE — Progress Notes (Signed)
Patient ID: Kendra Harrington, female    DOB: June 11, 1971, 48 y.o.   MRN: [id number]   Chief Complaint  Patient presents with  . Breast Problem    The patient is a 48 year old female here with her husband for a history and physical for secondary breast reconstruction.  She has expanders on both sides.  The right expander has 350 cc of saline.  The left expander has a leak so the volume is unknown.  She is able to retain the expansion for a few days after her fills.  She has not had any recent illnesses and is otherwise doing well.  We discussed options for replacing the expander.  She does not want to go through that and would rather be a little bit smaller.   Review of Systems  Constitutional: Negative.  Negative for activity change and appetite change.  HENT: Negative.  Negative for mouth sores.   Eyes: Negative.   Respiratory: Negative.   Cardiovascular: Negative.   Gastrointestinal: Negative.   Genitourinary: Negative.   Musculoskeletal: Negative.   Skin: Negative.  Negative for wound.  Neurological: Negative for dizziness.  Hematological: Negative.   Psychiatric/Behavioral: Negative.     Past Medical History:  Diagnosis Date  . BRCA negative 12/26/2017   [facility]   . Cancer ([place]) 11/20/2017   DUCTAL CARCINOMA IN SITU left breast  . GERD (gastroesophageal reflux disease)    OCC-NO MEDS  . Hypertension     Past Surgical History:  Procedure Laterality Date  . BREAST BIOPSY Right 02/21/2014   upper-outer calcs=atypical ductal hyperplasia and background of columnar cell change w/ calcifications. cytologic atypia per rad report from [place].  [facility] Kitchen BREAST BIOPSY Right 02/21/2014   Central Right calcs=atypical ductal hyperplasia with atypical lobular hyperplasia in a background of columnar cell change with calcifications and papillary apocrine metaplasia per rad report from [place]  . BREAST BIOPSY Left 11/20/2017   Affirm Bx- DUCTAL CARCINOMA IN SITU  . BREAST BIOPSY Right  11/20/2017   Affirm Bx of 2 areas-  ATYPICAL DUCTAL HYPERPLASIA and PSEUDO-ANGIOMATOUS STROMAL HYPERPLASIA  . BREAST BIOPSY Bilateral 05/18/2018   Procedure: BREAST BIOPSY WITH NEEDLE LOCALIZATION;  Surgeon: Robert Bellow, MD;  Location: [facility] ORS;  Service: General;  Laterality: Bilateral;  . BREAST EXCISIONAL BIOPSY Right 2015   "Benign" per pt  . BREAST EXCISIONAL BIOPSY Left 05/18/2018   NL for  lumpectomy - DCIS  . BREAST EXCISIONAL BIOPSY Right 05/18/2018   NL for lumpectomy for 2 areas of atypical ductal hyperplasia   . BREAST RECONSTRUCTION WITH PLACEMENT OF TISSUE EXPANDER AND FLEX HD (ACELLULAR HYDRATED DERMIS) Bilateral 08/08/2018   Procedure: BREAST RECONSTRUCTION WITH PLACEMENT OF TISSUE EXPANDER AND FLEX HD (ACELLULAR HYDRATED DERMIS);  Surgeon: Wallace Going, DO;  Location: [facility] ORS;  Service: Plastics;  Laterality: Bilateral;  . CESAREAN SECTION    . MASTECTOMY W/ SENTINEL NODE BIOPSY Bilateral 08/08/2018   Procedure: MASTECTOMY WITH SENTINEL LYMPH NODE BIOPSY;  Surgeon: Robert Bellow, MD;  Location: [facility] ORS;  Service: General;  Laterality: Bilateral;      Current Outpatient Medications:  .  acetaminophen (TYLENOL) 325 MG tablet, Take 650 mg by mouth every 6 (six) hours as needed., Disp: , Rfl:  .  amLODipine (NORVASC) 5 MG tablet, Take 1 tablet (5 mg total) by mouth daily., Disp: 90 tablet, Rfl: 1 .  cephALEXin (KEFLEX) 500 MG capsule, Take 1 capsule (500 mg total) by mouth 4 (four) times daily., Disp: 20 capsule, Rfl: 0 .  diazepam (VALIUM) 2 MG tablet, Take 1 tablet (2 mg total) by mouth every 6 (six) hours as needed for muscle spasms (post op)., Disp: 20 tablet, Rfl: 0 .  HYDROcodone-acetaminophen (NORCO/VICODIN) 5-325 MG tablet, Take 1 tablet by mouth every 4 (four) hours as needed for moderate pain (post op)., Disp: 20 tablet, Rfl: 0 .  ondansetron (ZOFRAN) 4 MG tablet, Take 1 tablet (4 mg total) by mouth every 8 (eight) hours as needed for nausea or  vomiting (post op)., Disp: 10 tablet, Rfl: 0 .  tamoxifen (NOLVADEX) 20 MG tablet, Take 1 tablet (20 mg total) by mouth daily., Disp: 30 tablet, Rfl: 3   Objective:   Vitals:   11/23/18 1004  BP: 140/90  Pulse: 69  Temp: 98.5 F (36.9 C)  SpO2: 100%    Physical Exam Vitals signs and nursing note reviewed.  Constitutional:      Appearance: Normal appearance.  HENT:     Head: Normocephalic and atraumatic.  Cardiovascular:     Rate and Rhythm: Normal rate.     Pulses: Normal pulses.  Pulmonary:     Effort: Pulmonary effort is normal.  Abdominal:     General: Abdomen is flat.  Neurological:     General: No focal deficit present.     Mental Status: She is alert.  Psychiatric:        Mood and Affect: Mood normal.        Thought Content: Thought content normal.        Judgment: Judgment normal.     Assessment & Plan:  S/P breast reconstruction, bilateral  Acquired absence of breast and absent nipple, bilateral We placed injectable saline in the Expander using a sterile technique: Right: 350 cc / 300 cc Left: 100 cc placed today in a 300 cc expander  Plan for bilateral removal of expanders and placement of implants.  She is aware there is a limit to her size potential due to the leak of the left expander. The risks that can be encountered with and after placement of a breast implant were discussed and include the following but not limited to these: bleeding, infection, delayed healing, anesthesia risks, skin sensation changes, injury to structures including nerves, blood vessels, and muscles which may be temporary or permanent, allergies to tape, suture materials and glues, blood products, topical preparations or injected agents, skin contour irregularities, skin discoloration and swelling, deep vein thrombosis, cardiac and pulmonary complications, pain, which may persist, fluid accumulation, wrinkling of the skin over the expander, changes in nipple or breast sensation,  expander leakage or rupture, faulty position of the expander, persistent pain, formation of tight scar tissue around the expander (capsular contracture), possible need for revisional surgery or staged procedures.     [place], DO

## 2018-11-27 ENCOUNTER — Ambulatory Visit (INDEPENDENT_AMBULATORY_CARE_PROVIDER_SITE_OTHER): Payer: Self-pay | Admitting: Plastic Surgery

## 2018-11-27 ENCOUNTER — Encounter: Payer: Self-pay | Admitting: Plastic Surgery

## 2018-11-27 VITALS — BP 137/96 | HR 67 | Temp 98.5°F | Ht 64.0 in | Wt 155.0 lb

## 2018-11-27 DIAGNOSIS — C50411 Malignant neoplasm of upper-outer quadrant of right female breast: Secondary | ICD-10-CM

## 2018-11-27 DIAGNOSIS — Z9889 Other specified postprocedural states: Secondary | ICD-10-CM

## 2018-11-27 DIAGNOSIS — Z170 Estrogen receptor positive status [ER+]: Secondary | ICD-10-CM

## 2018-11-27 DIAGNOSIS — Z9013 Acquired absence of bilateral breasts and nipples: Secondary | ICD-10-CM

## 2018-11-27 DIAGNOSIS — D0512 Intraductal carcinoma in situ of left breast: Secondary | ICD-10-CM

## 2018-11-27 NOTE — Progress Notes (Signed)
   Subjective:    Patient ID: Kendra Harrington, female    DOB: 23-Jan-1971, 48 y.o.   MRN: [id number]  Patient is a 48 year old female here with her husband for further discussion on her breast reconstruction that is scheduled for tomorrow.  She has 350 cc of saline in the right expander.  The left expander has a leak so we have been putting about 100 cc extra and at her visit to keep her expanded.  There is no sign of infection and all incision sites are well-healed.      Review of Systems  Constitutional: Negative.   HENT: Negative.   Eyes: Negative.   Respiratory: Negative.   Cardiovascular: Negative.   Gastrointestinal: Negative.   Endocrine: Negative.   Genitourinary: Negative.   Musculoskeletal: Negative.   Skin: Negative.        Objective:   Physical Exam Vitals signs and nursing note reviewed.  Constitutional:      Appearance: Normal appearance.  HENT:     Head: Normocephalic and atraumatic.  Neck:     Musculoskeletal: Normal range of motion.  Cardiovascular:     Rate and Rhythm: Normal rate.     Pulses: Normal pulses.  Pulmonary:     Effort: Pulmonary effort is normal.  Skin:    General: Skin is warm.     Capillary Refill: Capillary refill takes less than 2 seconds.  Neurological:     General: No focal deficit present.     Mental Status: She is alert.  Psychiatric:        Mood and Affect: Mood normal.        Thought Content: Thought content normal.        Judgment: Judgment normal.           Assessment & Plan:  Malignant neoplasm of upper-outer quadrant of right breast in female, estrogen receptor positive ([place])  Ductal carcinoma in situ (DCIS) of left breast  S/P breast reconstruction, bilateral  Acquired absence of breast and absent nipple, bilateral  Right expander has 350 /300 cc Left had 100 cc added. We discussed the options for if the implant did not fit at 350.  If that happens we agreed for placement of another expander.  This order has  been placed.

## 2018-11-27 NOTE — H&P (View-Only) (Signed)
   Subjective:    Patient ID: Kendra Harrington, female    DOB: January 09, 1971, 48 y.o.   MRN: [id number]  Patient is a 48 year old female here with her husband for further discussion on her breast reconstruction that is scheduled for tomorrow.  She has 350 cc of saline in the right expander.  The left expander has a leak so we have been putting about 100 cc extra and at her visit to keep her expanded.  There is no sign of infection and all incision sites are well-healed.      Review of Systems  Constitutional: Negative.   HENT: Negative.   Eyes: Negative.   Respiratory: Negative.   Cardiovascular: Negative.   Gastrointestinal: Negative.   Endocrine: Negative.   Genitourinary: Negative.   Musculoskeletal: Negative.   Skin: Negative.        Objective:   Physical Exam Vitals signs and nursing note reviewed.  Constitutional:      Appearance: Normal appearance.  HENT:     Head: Normocephalic and atraumatic.  Neck:     Musculoskeletal: Normal range of motion.  Cardiovascular:     Rate and Rhythm: Normal rate.     Pulses: Normal pulses.  Pulmonary:     Effort: Pulmonary effort is normal.  Skin:    General: Skin is warm.     Capillary Refill: Capillary refill takes less than 2 seconds.  Neurological:     General: No focal deficit present.     Mental Status: She is alert.  Psychiatric:        Mood and Affect: Mood normal.        Thought Content: Thought content normal.        Judgment: Judgment normal.           Assessment & Plan:  Malignant neoplasm of upper-outer quadrant of right breast in female, estrogen receptor positive ([place])  Ductal carcinoma in situ (DCIS) of left breast  S/P breast reconstruction, bilateral  Acquired absence of breast and absent nipple, bilateral  Right expander has 350 /300 cc Left had 100 cc added. We discussed the options for if the implant did not fit at 350.  If that happens we agreed for placement of another expander.  This order has  been placed.

## 2018-11-28 ENCOUNTER — Ambulatory Visit (HOSPITAL_BASED_OUTPATIENT_CLINIC_OR_DEPARTMENT_OTHER): Payer: BLUE CROSS/BLUE SHIELD | Admitting: Anesthesiology

## 2018-11-28 ENCOUNTER — Encounter (HOSPITAL_BASED_OUTPATIENT_CLINIC_OR_DEPARTMENT_OTHER)
Admission: RE | Disposition: A | Discharge status: Home / Self Care | Payer: Self-pay | Source: Home / Self Care | Attending: Plastic Surgery

## 2018-11-28 ENCOUNTER — Other Ambulatory Visit: Payer: Self-pay

## 2018-11-28 ENCOUNTER — Ambulatory Visit (HOSPITAL_BASED_OUTPATIENT_CLINIC_OR_DEPARTMENT_OTHER)
Admission: RE | Admit: 2018-11-28 | Discharge: 2018-11-28 | Disposition: A | Discharge status: Home / Self Care | Payer: BLUE CROSS/BLUE SHIELD | Attending: Plastic Surgery | Admitting: Plastic Surgery

## 2018-11-28 ENCOUNTER — Encounter (HOSPITAL_BASED_OUTPATIENT_CLINIC_OR_DEPARTMENT_OTHER): Payer: Self-pay | Admitting: *Deleted

## 2018-11-28 DIAGNOSIS — Z9013 Acquired absence of bilateral breasts and nipples: Secondary | ICD-10-CM | POA: Insufficient documentation

## 2018-11-28 DIAGNOSIS — Z853 Personal history of malignant neoplasm of breast: Secondary | ICD-10-CM | POA: Insufficient documentation

## 2018-11-28 DIAGNOSIS — Z421 Encounter for breast reconstruction following mastectomy: Secondary | ICD-10-CM | POA: Diagnosis not present

## 2018-11-28 DIAGNOSIS — Z79899 Other long term (current) drug therapy: Secondary | ICD-10-CM | POA: Insufficient documentation

## 2018-11-28 DIAGNOSIS — C50411 Malignant neoplasm of upper-outer quadrant of right female breast: Secondary | ICD-10-CM | POA: Diagnosis not present

## 2018-11-28 DIAGNOSIS — Z170 Estrogen receptor positive status [ER+]: Secondary | ICD-10-CM | POA: Diagnosis not present

## 2018-11-28 DIAGNOSIS — I10 Essential (primary) hypertension: Secondary | ICD-10-CM | POA: Diagnosis not present

## 2018-11-28 HISTORY — PX: REMOVAL OF BILATERAL TISSUE EXPANDERS WITH PLACEMENT OF BILATERAL BREAST IMPLANTS: SHX6431

## 2018-11-28 LAB — POCT PREGNANCY, URINE: Preg Test, Ur: NEGATIVE

## 2018-11-28 SURGERY — REMOVAL, TISSUE EXPANDER, BREAST, BILATERAL, WITH BILATERAL IMPLANT IMPLANT INSERTION
Anesthesia: General | Site: Breast | Laterality: Bilateral

## 2018-11-28 MED ORDER — SODIUM CHLORIDE 0.9% FLUSH: 3.0000 mL | INTRAVENOUS | Status: DC | PRN

## 2018-11-28 MED ORDER — PHENYLEPHRINE 40 MCG/ML (10ML) SYRINGE FOR IV PUSH (FOR BLOOD PRESSURE SUPPORT)
PREFILLED_SYRINGE | INTRAVENOUS | Status: AC
  Filled 2018-11-28: qty 10

## 2018-11-28 MED ORDER — ONDANSETRON HCL 4 MG/2ML IJ SOLN
INTRAMUSCULAR | Status: AC
  Filled 2018-11-28: qty 2

## 2018-11-28 MED ORDER — OXYCODONE HCL 5 MG PO TABS
ORAL_TABLET | ORAL | Status: AC
  Filled 2018-11-28: qty 1

## 2018-11-28 MED ORDER — ACETAMINOPHEN 325 MG PO TABS: 650.0000 mg | ORAL_TABLET | ORAL | Status: DC | PRN

## 2018-11-28 MED ORDER — LIDOCAINE-EPINEPHRINE 1 %-1:100000 IJ SOLN
INTRAMUSCULAR | Status: AC
  Filled 2018-11-28: qty 1

## 2018-11-28 MED ORDER — CEFAZOLIN SODIUM-DEXTROSE 2-4 GM/100ML-% IV SOLN
2.0000 g | INTRAVENOUS | Status: AC
  Administered 2018-11-28 (×2): 2 g via INTRAVENOUS

## 2018-11-28 MED ORDER — LACTATED RINGERS IV SOLN
INTRAVENOUS | Status: DC
  Administered 2018-11-28 (×2): via INTRAVENOUS

## 2018-11-28 MED ORDER — OXYCODONE HCL 5 MG PO TABS
5.0000 mg | ORAL_TABLET | ORAL | Status: DC | PRN
  Administered 2018-11-28 (×2): 5 mg via ORAL

## 2018-11-28 MED ORDER — LIDOCAINE 2% (20 MG/ML) 5 ML SYRINGE
INTRAMUSCULAR | Status: AC
  Filled 2018-11-28: qty 5

## 2018-11-28 MED ORDER — CELECOXIB 200 MG PO CAPS
200.0000 mg | ORAL_CAPSULE | Freq: Once | ORAL | Status: AC
  Administered 2018-11-28: 200 mg via ORAL

## 2018-11-28 MED ORDER — PHENYLEPHRINE 40 MCG/ML (10ML) SYRINGE FOR IV PUSH (FOR BLOOD PRESSURE SUPPORT)
PREFILLED_SYRINGE | INTRAVENOUS | Status: DC | PRN
  Administered 2018-11-28 (×2): 80 ug via INTRAVENOUS

## 2018-11-28 MED ORDER — ACETAMINOPHEN 650 MG RE SUPP: 650.0000 mg | RECTAL | Status: DC | PRN

## 2018-11-28 MED ORDER — FENTANYL CITRATE (PF) 100 MCG/2ML IJ SOLN
INTRAMUSCULAR | Status: AC
  Filled 2018-11-28: qty 2

## 2018-11-28 MED ORDER — BUPIVACAINE-EPINEPHRINE (PF) 0.25% -1:200000 IJ SOLN
INTRAMUSCULAR | Status: AC
  Filled 2018-11-28: qty 60

## 2018-11-28 MED ORDER — HYDROCODONE-ACETAMINOPHEN 7.5-325 MG PO TABS: 1.0000 | ORAL_TABLET | Freq: Once | ORAL | Status: DC | PRN

## 2018-11-28 MED ORDER — LIDOCAINE HCL (CARDIAC) PF 100 MG/5ML IV SOSY
PREFILLED_SYRINGE | INTRAVENOUS | Status: DC | PRN
  Administered 2018-11-28: 50 mg via INTRAVENOUS

## 2018-11-28 MED ORDER — FENTANYL CITRATE (PF) 100 MCG/2ML IJ SOLN
25.0000 ug | INTRAMUSCULAR | Status: DC | PRN
  Administered 2018-11-28 (×2): 50 ug via INTRAVENOUS

## 2018-11-28 MED ORDER — FENTANYL CITRATE (PF) 100 MCG/2ML IJ SOLN
50.0000 ug | INTRAMUSCULAR | Status: AC | PRN
  Administered 2018-11-28 (×2): 50 ug via INTRAVENOUS
  Administered 2018-11-28: 100 ug via INTRAVENOUS

## 2018-11-28 MED ORDER — PROMETHAZINE HCL 25 MG/ML IJ SOLN: 6.2500 mg | INTRAMUSCULAR | Status: DC | PRN

## 2018-11-28 MED ORDER — SODIUM CHLORIDE 0.9% FLUSH: 3.0000 mL | Freq: Two times a day (BID) | INTRAVENOUS | Status: DC

## 2018-11-28 MED ORDER — CEFAZOLIN SODIUM-DEXTROSE 2-4 GM/100ML-% IV SOLN
INTRAVENOUS | Status: AC
  Filled 2018-11-28: qty 100

## 2018-11-28 MED ORDER — EPHEDRINE 5 MG/ML INJ
INTRAVENOUS | Status: AC
  Filled 2018-11-28: qty 10

## 2018-11-28 MED ORDER — DEXAMETHASONE SODIUM PHOSPHATE 10 MG/ML IJ SOLN
INTRAMUSCULAR | Status: AC
  Filled 2018-11-28: qty 1

## 2018-11-28 MED ORDER — LIDOCAINE-EPINEPHRINE 1 %-1:100000 IJ SOLN
INTRAMUSCULAR | Status: DC | PRN
  Administered 2018-11-28: 8 mL

## 2018-11-28 MED ORDER — CELECOXIB 200 MG PO CAPS
ORAL_CAPSULE | ORAL | Status: AC
  Filled 2018-11-28: qty 1

## 2018-11-28 MED ORDER — ONDANSETRON HCL 4 MG/2ML IJ SOLN
INTRAMUSCULAR | Status: DC | PRN
  Administered 2018-11-28: 4 mg via INTRAVENOUS

## 2018-11-28 MED ORDER — EPHEDRINE SULFATE 50 MG/ML IJ SOLN
INTRAMUSCULAR | Status: DC | PRN
  Administered 2018-11-28: 10 mg via INTRAVENOUS

## 2018-11-28 MED ORDER — SODIUM CHLORIDE 0.9 % IV SOLN: 250.0000 mL | INTRAVENOUS | Status: DC | PRN

## 2018-11-28 MED ORDER — DEXAMETHASONE SODIUM PHOSPHATE 4 MG/ML IJ SOLN
INTRAMUSCULAR | Status: DC | PRN
  Administered 2018-11-28: 10 mg via INTRAVENOUS

## 2018-11-28 MED ORDER — LACTATED RINGERS IV SOLN: 500.0000 mL | INTRAVENOUS | Status: DC

## 2018-11-28 MED ORDER — ACETAMINOPHEN 500 MG PO TABS
1000.0000 mg | ORAL_TABLET | Freq: Once | ORAL | Status: AC
  Administered 2018-11-28: 1000 mg via ORAL

## 2018-11-28 MED ORDER — SUCCINYLCHOLINE CHLORIDE 200 MG/10ML IV SOSY
PREFILLED_SYRINGE | INTRAVENOUS | Status: AC
  Filled 2018-11-28: qty 10

## 2018-11-28 MED ORDER — ACETAMINOPHEN 500 MG PO TABS
ORAL_TABLET | ORAL | Status: AC
  Filled 2018-11-28: qty 2

## 2018-11-28 MED ORDER — MIDAZOLAM HCL 2 MG/2ML IJ SOLN
1.0000 mg | INTRAMUSCULAR | Status: DC | PRN
  Administered 2018-11-28: 2 mg via INTRAVENOUS

## 2018-11-28 MED ORDER — SODIUM CHLORIDE 0.9 % IV SOLN
INTRAVENOUS | Status: AC
  Filled 2018-11-28: qty 500000

## 2018-11-28 MED ORDER — PROPOFOL 10 MG/ML IV BOLUS
INTRAVENOUS | Status: DC | PRN
  Administered 2018-11-28: 150 mg via INTRAVENOUS

## 2018-11-28 MED ORDER — MIDAZOLAM HCL 2 MG/2ML IJ SOLN
INTRAMUSCULAR | Status: AC
  Filled 2018-11-28: qty 2

## 2018-11-28 SURGICAL SUPPLY — 70 items
BAG DECANTER FOR FLEXI CONT (MISCELLANEOUS) ×3 IMPLANT
BINDER BREAST LRG (GAUZE/BANDAGES/DRESSINGS) ×3 IMPLANT
BINDER BREAST MEDIUM (GAUZE/BANDAGES/DRESSINGS) IMPLANT
BINDER BREAST XLRG (GAUZE/BANDAGES/DRESSINGS) IMPLANT
BINDER BREAST XXLRG (GAUZE/BANDAGES/DRESSINGS) IMPLANT
BIOPATCH RED 1 DISK 7.0 (GAUZE/BANDAGES/DRESSINGS) IMPLANT
BIOPATCH RED 1IN DISK 7.0MM (GAUZE/BANDAGES/DRESSINGS)
BLADE HEX COATED 2.75 (ELECTRODE) ×3 IMPLANT
BLADE SURG 15 STRL LF DISP TIS (BLADE) ×2 IMPLANT
BLADE SURG 15 STRL SS (BLADE) ×4
BNDG GAUZE ELAST 4 BULKY (GAUZE/BANDAGES/DRESSINGS) IMPLANT
CANISTER SUCT 1200ML W/VALVE (MISCELLANEOUS) ×3 IMPLANT
CHLORAPREP W/TINT 26ML (MISCELLANEOUS) ×3 IMPLANT
CORD BIPOLAR FORCEPS 12FT (ELECTRODE) IMPLANT
COVER BACK TABLE 60X90IN (DRAPES) ×3 IMPLANT
COVER MAYO STAND STRL (DRAPES) ×3 IMPLANT
COVER WAND RF STERILE (DRAPES) IMPLANT
DECANTER SPIKE VIAL GLASS SM (MISCELLANEOUS) IMPLANT
DERMABOND ADVANCED (GAUZE/BANDAGES/DRESSINGS)
DERMABOND ADVANCED .7 DNX12 (GAUZE/BANDAGES/DRESSINGS) IMPLANT
DRAIN CHANNEL 19F RND (DRAIN) IMPLANT
DRAPE LAPAROSCOPIC ABDOMINAL (DRAPES) ×3 IMPLANT
DRSG PAD ABDOMINAL 8X10 ST (GAUZE/BANDAGES/DRESSINGS) ×6 IMPLANT
ELECT BLADE 4.0 EZ CLEAN MEGAD (MISCELLANEOUS) ×3
ELECT REM PT RETURN 9FT ADLT (ELECTROSURGICAL) ×3
ELECTRODE BLDE 4.0 EZ CLN MEGD (MISCELLANEOUS) ×1 IMPLANT
ELECTRODE REM PT RTRN 9FT ADLT (ELECTROSURGICAL) ×1 IMPLANT
EVACUATOR SILICONE 100CC (DRAIN) IMPLANT
GAUZE SPONGE 4X4 12PLY STRL LF (GAUZE/BANDAGES/DRESSINGS) IMPLANT
GLOVE BIO SURGEON STRL SZ 6.5 (GLOVE) ×8 IMPLANT
GLOVE BIO SURGEONS STRL SZ 6.5 (GLOVE) ×4
GLOVE BIOGEL PI IND STRL 8 (GLOVE) ×1 IMPLANT
GLOVE BIOGEL PI INDICATOR 8 (GLOVE) ×2
GLOVE SURG SYN 8.0 (GLOVE) ×3 IMPLANT
GOWN STRL REUS W/ TWL LRG LVL3 (GOWN DISPOSABLE) ×2 IMPLANT
GOWN STRL REUS W/TWL LRG LVL3 (GOWN DISPOSABLE) ×4
IMPL BREAST GEL HP 375CC (Breast) ×2 IMPLANT
IMPLANT BREAST GEL HP 375CC (Breast) ×6 IMPLANT
IV NS 1000ML (IV SOLUTION)
IV NS 1000ML BAXH (IV SOLUTION) IMPLANT
IV NS 500ML (IV SOLUTION)
IV NS 500ML BAXH (IV SOLUTION) IMPLANT
KIT FILL SYSTEM UNIVERSAL (SET/KITS/TRAYS/PACK) IMPLANT
NDL SAFETY ECLIPSE 18X1.5 (NEEDLE) ×1 IMPLANT
NEEDLE HYPO 18GX1.5 SHARP (NEEDLE) ×2
NEEDLE HYPO 25X1 1.5 SAFETY (NEEDLE) ×3 IMPLANT
PACK BASIN DAY SURGERY FS (CUSTOM PROCEDURE TRAY) ×3 IMPLANT
PENCIL BUTTON HOLSTER BLD 10FT (ELECTRODE) ×3 IMPLANT
PIN SAFETY STERILE (MISCELLANEOUS) IMPLANT
SIZER BREAST REUSE 375CC (SIZER) ×4
SIZER BRST REUSE P4.8 12 375CC (SIZER) ×1 IMPLANT
SIZER BRST REUSE P5.3 375CC (SIZER) ×1 IMPLANT
SLEEVE SCD COMPRESS KNEE MED (MISCELLANEOUS) ×3 IMPLANT
SPONGE LAP 18X18 RF (DISPOSABLE) ×6 IMPLANT
STRIP SUTURE WOUND CLOSURE 1/2 (SUTURE) IMPLANT
SUT MNCRL AB 4-0 PS2 18 (SUTURE) ×6 IMPLANT
SUT MON AB 3-0 SH 27 (SUTURE) ×8
SUT MON AB 3-0 SH27 (SUTURE) ×4 IMPLANT
SUT MON AB 5-0 PS2 18 (SUTURE) ×6 IMPLANT
SUT PDS AB 2-0 CT2 27 (SUTURE) IMPLANT
SUT VIC AB 3-0 SH 27 (SUTURE)
SUT VIC AB 3-0 SH 27X BRD (SUTURE) IMPLANT
SUT VICRYL 4-0 PS2 18IN ABS (SUTURE) IMPLANT
SYR BULB IRRIGATION 50ML (SYRINGE) ×3 IMPLANT
SYR CONTROL 10ML LL (SYRINGE) ×3 IMPLANT
TOWEL GREEN STERILE FF (TOWEL DISPOSABLE) ×6 IMPLANT
TUBE CONNECTING 20'X1/4 (TUBING) ×1
TUBE CONNECTING 20X1/4 (TUBING) ×2 IMPLANT
UNDERPAD 30X30 (UNDERPADS AND DIAPERS) ×6 IMPLANT
YANKAUER SUCT BULB TIP NO VENT (SUCTIONS) ×3 IMPLANT

## 2018-11-28 NOTE — Transfer of Care (Signed)
Immediate Anesthesia Transfer of Care Note  Patient: Kendra Harrington  Procedure(s) Performed: REMOVAL OF BILATERAL TISSUE EXPANDERS WITH PLACEMENT OF BILATERAL BREAST IMPLANTS (Bilateral Breast)  Patient Location: PACU  Anesthesia Type:General  Level of Consciousness: awake, alert  and oriented  Airway & Oxygen Therapy: Patient Spontanous Breathing and Patient connected to face mask oxygen  Post-op Assessment: Report given to RN and Post -op Vital signs reviewed and stable  Post vital signs: Reviewed and stable  Last Vitals:  Vitals Value Taken Time  BP    Temp    Pulse 115 11/28/2018 10:20 AM  Resp 12 11/28/2018 10:20 AM  SpO2 96 % 11/28/2018 10:20 AM  Vitals shown include unvalidated device data.  Last Pain:  Vitals:   11/28/18 0637  TempSrc: Oral  PainSc: 6       Patients Stated Pain Goal: 3 (01/41/03 0131)  Complications: No apparent anesthesia complications

## 2018-11-28 NOTE — Anesthesia Postprocedure Evaluation (Signed)
Anesthesia Post Note  Patient: Kendra Harrington  Procedure(s) Performed: REMOVAL OF BILATERAL TISSUE EXPANDERS WITH PLACEMENT OF BILATERAL BREAST IMPLANTS (Bilateral Breast)     Patient location during evaluation: PACU Anesthesia Type: General Level of consciousness: awake and alert Pain management: pain level controlled Vital Signs Assessment: post-procedure vital signs reviewed and stable Respiratory status: spontaneous breathing, nonlabored ventilation, respiratory function stable and patient connected to nasal cannula oxygen Cardiovascular status: blood pressure returned to baseline and stable Postop Assessment: no apparent nausea or vomiting Anesthetic complications: no    Last Vitals:  Vitals:   11/28/18 1030 11/28/18 1045  BP: (!) 135/97 (!) 135/96  Pulse: (!) 103 (!) 102  Resp: 18 (!) 22  Temp:    SpO2: 95% 96%    Last Pain:  Vitals:   11/28/18 1045  TempSrc:   PainSc: 6                  Tiajuana Amass

## 2018-11-28 NOTE — Anesthesia Preprocedure Evaluation (Signed)
Anesthesia Evaluation  Patient identified by MRN, date of birth, ID band Patient awake    Reviewed: Allergy & Precautions, NPO status , Patient's Chart, lab work & pertinent test results  Airway Mallampati: II  TM Distance: >3 FB Neck ROM: Full    Dental  (+) Dental Advisory Given   Pulmonary neg pulmonary ROS, Current Smoker,    breath sounds clear to auscultation       Cardiovascular hypertension, Pt. on medications negative cardio ROS   Rhythm:Regular Rate:Normal     Neuro/Psych negative neurological ROS  negative psych ROS   GI/Hepatic Neg liver ROS, GERD  ,  Endo/Other  negative endocrine ROS  Renal/GU negative Renal ROS  negative genitourinary   Musculoskeletal negative musculoskeletal ROS (+)   Abdominal   Peds negative pediatric ROS (+)  Hematology negative hematology ROS (+)   Anesthesia Other Findings   Reproductive/Obstetrics negative OB ROS                             Anesthesia Physical Anesthesia Plan  ASA: II  Anesthesia Plan: General   Post-op Pain Management:    Induction: Intravenous  PONV Risk Score and Plan: 2 and Dexamethasone, Ondansetron and Treatment may vary due to age or medical condition  Airway Management Planned: LMA and Oral ETT  Additional Equipment:   Intra-op Plan:   Post-operative Plan: Extubation in OR  Informed Consent: I have reviewed the patients History and Physical, chart, labs and discussed the procedure including the risks, benefits and alternatives for the proposed anesthesia with the patient or authorized representative who has indicated his/her understanding and acceptance.     Dental advisory given  Plan Discussed with: CRNA  Anesthesia Plan Comments:        Anesthesia Quick Evaluation

## 2018-11-28 NOTE — Discharge Instructions (Signed)
Oxycodone 5mg  given at 11:30am   Post Anesthesia Home Care Instructions  Activity: Get plenty of rest for the remainder of the day. A responsible individual must stay with you for 24 hours following the procedure.  For the next 24 hours, DO NOT: -Drive a car -Paediatric nurse -Drink alcoholic beverages -Take any medication unless instructed by your physician -Make any legal decisions or sign important papers.  Meals: Start with liquid foods such as gelatin or soup. Progress to regular foods as tolerated. Avoid greasy, spicy, heavy foods. If nausea and/or vomiting occur, drink only clear liquids until the nausea and/or vomiting subsides. Call your physician if vomiting continues.  Special Instructions/Symptoms: Your throat may feel dry or sore from the anesthesia or the breathing tube placed in your throat during surgery. If this causes discomfort, gargle with warm salt water. The discomfort should disappear within 24 hours.  If you had a scopolamine patch placed behind your ear for the management of post- operative nausea and/or vomiting:  1. The medication in the patch is effective for 72 hours, after which it should be removed.  Wrap patch in a tissue and discard in the trash. Wash hands thoroughly with soap and water. 2. You may remove the patch earlier than 72 hours if you experience unpleasant side effects which may include dry mouth, dizziness or visual disturbances. 3. Avoid touching the patch. Wash your hands with soap and water after contact with the patch.    INSTRUCTIONS FOR AFTER BREAST SURGERY   You are getting ready to undergo breast surgery.  You will likely have some questions about what to expect following your operation.  The following information will help you and your family understand what to expect when you are discharged from the hospital.  Following these guidelines will help ensure a smooth recovery and reduce risks of complications.   Postoperative  instructions include information on: diet, wound care, medications and physical activity.  AFTER SURGERY Expect to go home after the procedure.  In some cases, you may need to spend one night in the hospital for observation.  DIET Breast surgery does not require a specific diet.  However, I have to mention that the healthier you eat the better your body can start healing. It is important to increasing your protein intake.  This means limiting the foods with sugar and carbohydrates.  Focus on vegetables and some meat.  If you have any liposuction during your procedure be sure to drink water.  If your urine is bright yellow, then it is concentrated, and you need to drink more water.  As a general rule after surgery, you should have 8 ounces of water every hour while awake.  If you find you are persistently nauseated or unable to take in liquids let us know.  NO TOBACCO USE or EXPOSURE.  This will slow your healing process and increase the risk of a wound.  WOUND CARE You can shower the day after surgery.  Use fragrance free soap.  Dial, [place] and [place] are usually mild on the skin.  No baths, pools or hot tubs for two weeks. We close your incision to leave the smallest and best-looking scar. No ointment or creams on your incisions until given the go ahead.  Especially not Neosporin (Too many skin reactions with this one).  A few weeks after surgery you can use Mederma and start massaging the scar. We ask you to wear your binder or sports bra for the first 6 weeks around the  clock, including while sleeping. This provides added comfort and helps reduce the fluid accumulation at the surgery site.  ACTIVITY No heavy lifting until cleared by the doctor.  This usually means no more than a half-gallon of milk.  It is OK to walk and climb stairs. In fact, moving your legs is very important to decrease your risk of a blood clot.  It will also help keep you from getting deconditioned.  Every 1 to 2 hours get up and  walk for 5 minutes. This will help with a quicker recovery back to normal.  Let pain be your guide so you don't do too much.  NO, you cannot do the spring cleaning and don't plan on taking care of anyone else.  This is your time for TLC.  You will be more comfortable if you sleep and rest with your head elevated either with a few pillows under you or in a recliner.  No stomach sleeping for a few months.  WORK Everyone returns to work at different times. As a rough guide, most people take at least 1 - 2 weeks off prior to returning to work. If you need documentation for your job, bring the forms to your postoperative follow up visit.  DRIVING Arrange for someone to bring you home from the hospital.  You may be able to drive a few days after surgery but not while taking any narcotics or valium.  BOWEL MOVEMENTS Constipation can occur after anesthesia and while taking pain medication.  It is important to stay ahead for your comfort.  We recommend taking Milk of Magnesia (2 tablespoons; twice a day) while taking the pain pills.  SEROMA This is fluid your body tried to put in the surgical site.  This is normal but if it creates tight skinny skin let us know.  It usually decreases in a few weeks.  WHEN TO CALL Call your surgeon's office if any of the following occur:  Fever 101 degrees F or greater  Excessive bleeding or fluid from the incision site.  Pain that increases over time without aid from the medications  Redness, warmth, or pus draining from incision sites  Persistent nausea or inability to take in liquids  Severe misshapen area that underwent the operation.  Here are some resources:  1. Plastic surgery website: https://www.plasticsurgery.org/for-medical-professionals/education-and-resources/publications/breast-reconstruction-magazine 2. Breast Reconstruction Awareness Campaign:  HotelL[employer]co[employer]nz 3. Plastic surgery Implant information:   https://www.plasticsurgery.org/patient-safety/breast-implant-safety

## 2018-11-28 NOTE — Op Note (Signed)
Op report Bilateral Exchange   DATE OF OPERATION: 11/28/2018  LOCATION: [place]  SURGICAL DIVISION: Plastic Surgery  PREOPERATIVE DIAGNOSES:  1. History of breast cancer.  2. Acquired absence of bilateral breast.   POSTOPERATIVE DIAGNOSES:  1. History of breast cancer.  2. Acquired absence of bilateral breast.   PROCEDURE:  1. Bilateral exchange of tissue expanders for implants.  2. Bilateral capsulotomies for implant respositioning.  SURGEON: Claire Sanger Dillingham, DO  ANESTHESIA:  General.   COMPLICATIONS: None.   IMPLANTS: Left - Mentor Smooth Round Ultra High Profile Gel 375cc. Ref #[id number].  Serial Number [id number] Right - Mentor Smooth Round Ultra High Profile Gel 375cc. Ref #[id number].  Serial Number [id number]  INDICATIONS FOR PROCEDURE:  The patient, Kendra Harrington, is a 48 y.o. female born on 11-01-1970, is here for treatment after bilateral mastectomies.  She had tissue expanders placed at the time of mastectomies. She now presents for exchange of her expanders for implants.  She requires capsulotomies to better position the implants. MRN: [id number]  CONSENT:  Informed consent was obtained directly from the patient. Risks, benefits and alternatives were fully discussed. Specific risks including but not limited to bleeding, infection, hematoma, seroma, scarring, pain, implant infection, implant extrusion, capsular contracture, asymmetry, wound healing problems, and need for further surgery were all discussed. The patient did have an ample opportunity to have her questions answered to her satisfaction.   DESCRIPTION OF PROCEDURE:  The patient was taken to the operating room. SCDs were placed and IV antibiotics were given. The patient's chest was prepped and draped in a sterile fashion. A time out was performed and the implants to be used were identified.    On the right breast: One percent Lidocaine with epinephrine was used to  infiltrate at the incision site. The old mastectomy scar was excised.  The mastectomy flaps from the superior and inferior flaps were raised over the pectoralis major muscle for several centimeters to minimize tension for the closure. The pectoralis was split inferior to the skin incision to expose and remove the tissue expander.  Inspection of the pocket showed a normal healthy capsule and good integration of the biologic matrix.  The pocket was irrigated with antibiotic solution.  Circumferential capsulotomies were performed to allow for breast pocket expansion.  Measurements were made and a sizer used to confirm adequate pocket size for the implant dimensions.  Hemostasis was ensured with electrocautery. New gloves were placed. The implant was soaked in antibiotic solution and then placed in the pocket and oriented appropriately. The pectoralis major muscle and capsule on the anterior surface were re-closed with a 3-0 Monocryl suture. The remaining skin was closed with 4-0 Monocryl deep dermal and 5-0 Monocryl subcuticular stitches.   On the left breast: The old mastectomy scar was excised.  The mastectomy flaps from the superior and inferior flaps were raised over the pectoralis major muscle for several centimeters to minimize tension for the closure. The pectoralis was split inferior to the skin incision to expose and remove the tissue expander.  Inspection of the pocket showed a normal healthy capsule and good integration of the biologic matrix.   Circumferential capsulotomies were performed to allow for breast pocket expansion throughout.  Measurements were made and a sizer utilized to confirm adequate pocket size for the implant dimensions.  Hemostasis was ensured with the electrocautery.  New gloves were applied. The implant was soaked in antibiotic solution and placed in the pocket and oriented appropriately. Attention was given  to the size and tightness of the pocket.  It was reasonable to move  ahead with the implant placement. The pectoralis major muscle and capsule on the anterior surface were re-closed with a 3-0 Monocryl suture. The remaining skin was closed with 4-0 Monocryl deep dermal and 5-0 Monocryl subcuticular stitches.  Dermabond was applied to the incision site. A breast binder and ABDs were placed.  The patient was allowed to wake from anesthesia and taken to the recovery room in satisfactory condition.

## 2018-11-28 NOTE — Anesthesia Procedure Notes (Signed)
Procedure Name: LMA Insertion Date/Time: 11/28/2018 8:11 AM Performed by: Willa Frater, CRNA Pre-anesthesia Checklist: Patient identified, Emergency Drugs available, Suction available and Patient being monitored Patient Re-evaluated:Patient Re-evaluated prior to induction Oxygen Delivery Method: Circle system utilized Preoxygenation: Pre-oxygenation with 100% oxygen Induction Type: IV induction Ventilation: Mask ventilation without difficulty LMA: LMA inserted LMA Size: 4.0 Number of attempts: 1 Airway Equipment and Method: Bite block Placement Confirmation: positive ETCO2 Tube secured with: Tape Dental Injury: Teeth and Oropharynx as per pre-operative assessment

## 2018-11-28 NOTE — Interval H&P Note (Signed)
History and Physical Interval Note:  11/28/2018 7:58 AM  Kendra Harrington  has presented today for surgery, with the diagnosis of Status Post Bilateral Breast Reconstruction, Malignant neoplasm of upper-outer quadrant of right breast in female, estrogen receptor positive  The various methods of treatment have been discussed with the patient and family. After consideration of risks, benefits and other options for treatment, the patient has consented to  Procedure(s) with comments: REMOVAL OF BILATERAL TISSUE EXPANDERS WITH PLACEMENT OF BILATERAL BREAST IMPLANTS (Bilateral) - 120 min, please as a surgical intervention .  The patient's history has been reviewed, patient examined, no change in status, stable for surgery.  I have reviewed the patient's chart and labs.  Questions were answered to the patient's satisfaction.     Kendra Harrington Kendra Harrington

## 2018-11-29 ENCOUNTER — Telehealth: Payer: Self-pay

## 2018-11-29 ENCOUNTER — Encounter (HOSPITAL_BASED_OUTPATIENT_CLINIC_OR_DEPARTMENT_OTHER): Payer: Self-pay | Admitting: Plastic Surgery

## 2018-11-29 ENCOUNTER — Ambulatory Visit: Payer: BC Managed Care – PPO | Admitting: General Surgery

## 2018-11-29 MED ORDER — OXYCODONE-ACETAMINOPHEN 7.5-325 MG PO TABS: 1.0000 | ORAL_TABLET | Freq: Three times a day (TID) | ORAL | 0 refills | Status: AC | PRN

## 2018-11-29 NOTE — Telephone Encounter (Signed)
Call back to pt to inform her that Dr. Marla Roe agreed to prescribe Percocet as per pt request, but informed her that she may experience itching with this as well. Dr. Marla Roe instructed her to take OTC- Benadryl for the itching   Pt agrees with plan & will call for any concerns  [place], RNFA

## 2018-11-29 NOTE — Addendum Note (Signed)
Addended by: Wallace Going on: 11/29/2018 11:41 AM   Modules accepted: Orders

## 2018-11-29 NOTE — Telephone Encounter (Signed)
Call from pt to report c/o itching on her back, sides of her abdomen & arms, she is not sure which medication may be causing this reaction, because she took the meds close together She is taking Valium, & Hydrocodone, but has not taken any Keflex She denies any difficulty breathing or any rash. I will consult with Dr. Marla Roe & call pt back  [place]aine

## 2018-12-03 ENCOUNTER — Encounter: Payer: Self-pay | Admitting: Plastic Surgery

## 2018-12-03 NOTE — Progress Notes (Signed)
[place]  [phone] Fax:(336) [phone]  ID: [name] OB: 1971/04/30  MR#: [id number]  SWF#:[id number]  Patient Care Team: Volney American, PA-C as PCP - General (Family Medicine)  CHIEF COMPLAINT: Pathologic stage Ia ER/PR positive, HER-2 negative invasive carcinoma of the upper outer quadrant of the right breast. DCIS of left breast.  INTERVAL HISTORY: Patient returns to clinic today for routine 29-monthevaluation.  She is tolerating tamoxifen well without significant side effects.  She currently feels well and is asymptomatic.  She has now completed her plastic surgery reconstruction. She has no neurologic complaints.  She denies any recent fevers or illnesses.  She has a good appetite and denies weight loss.  She has no chest pain or shortness of breath.  She denies any nausea, vomiting, constipation, or diarrhea.  She has no urinary complaints.  Patient feels at her baseline offers no specific complaints today.  REVIEW OF SYSTEMS:   Review of Systems  Constitutional: Negative.  Negative for fever and malaise/fatigue.  Respiratory: Negative.  Negative for cough, hemoptysis and shortness of breath.   Cardiovascular: Negative.  Negative for chest pain and leg swelling.  Gastrointestinal: Negative.  Negative for abdominal pain.  Genitourinary: Negative.  Negative for dysuria.  Musculoskeletal: Negative.  Negative for back pain.  Skin: Negative.  Negative for rash.  Neurological: Negative.  Negative for focal weakness, weakness and headaches.  Psychiatric/Behavioral: The patient is nervous/anxious.     As per HPI. Otherwise, a complete review of systems is negative.  PAST MEDICAL HISTORY: Past Medical History:  Diagnosis Date  . BRCA negative 12/26/2017   [facility]   . [place]   DUCTAL CARCINOMA IN SITU left breast  . GERD (gastroesophageal reflux disease)    OCC-NO MEDS  . Hypertension     PAST SURGICAL  HISTORY: Past Surgical History:  Procedure Laterality Date  . BREAST BIOPSY Right 02/21/2014   upper-outer calcs=atypical ductal hyperplasia and background of columnar cell change w/ calcifications. cytologic atypia [place] [facility]  .Marland KitchenBREAST BIOPSY Right 02/21/2014   [place] Right calcs=atypical ductal hyperplasia with atypical lobular hyperplasia in a background of columnar cell change with calcifications and papillary apocrine metaplasia per rad [place] [facility] . BREAST BIOPSY Left 11/20/2017   Affirm Bx- DUCTAL CARCINOMA IN SITU  . BREAST BIOPSY Right 11/20/2017   Affirm Bx of 2 areas-  ATYPICAL DUCTAL HYPERPLASIA and PSEUDO-ANGIOMATOUS STROMAL HYPERPLASIA  . BREAST BIOPSY Bilateral 05/18/2018   Procedure: BREAST BIOPSY WITH NEEDLE LOCALIZATION;  Surgeon: BRobert Bellow MD;  Location: [facility] OR[facility];  Service: General;  Laterality: Bilateral;  . BREAST EXCISIONAL BIOPSY Right 2015   "Benign" per pt  . BREAST EXCISIONAL BIOPSY Left 05/18/2018   NL for  lumpectomy - DCIS  . BREAST EXCISIONAL BIOPSY Right 05/18/2018   NL for lumpectomy for 2 areas of atypical ductal hyperplasia   . BREAST RECONSTRUCTION WITH PLACEMENT OF TISSUE EXPANDER AND FLEX HD (ACELLULAR HYDRATED DERMIS) Bilateral 08/08/2018   Procedure: BREAST RECONSTRUCTION WITH PLACEMENT OF TISSUE EXPANDER AND FLEX HD (ACELLULAR HYDRATED DERMIS);  Surgeon: DWallace Going DO;  Location: [facility] ORS;  Service: Plastics;  Laterality: Bilateral;  . CESAREAN SECTION    . MASTECTOMY W/ SENTINEL NODE BIOPSY Bilateral 08/08/2018   Procedure: MASTECTOMY WITH SENTINEL LYMPH NODE BIOPSY;  Surgeon: BRobert Bellow MD;  Location: [facility] OR[facility];  Service: General;  Laterality: Bilateral;  . REMOVAL OF BILATERAL TISSUE EXPANDERS WITH PLACEMENT OF BILATERAL BREAST IMPLANTS Bilateral 11/28/2018  Procedure: REMOVAL OF BILATERAL TISSUE EXPANDERS WITH PLACEMENT OF BILATERAL BREAST IMPLANTS;  Surgeon: Wallace Going, DO;   Location: [place];  Service: Plastics;  Laterality: Bilateral;  120 min, please    FAMILY HISTORY: Family History  Problem Relation Age of Onset  . Hypertension Mother   . Stroke Father   . Breast cancer Neg Hx     ADVANCED DIRECTIVES (Y/N):  N  HEALTH MAINTENANCE: Social History   Tobacco Use  . Smoking status: Current Some Day Smoker    Years: 7.00    Types: E-cigarettes  . Smokeless tobacco: Never Used  . Tobacco comment: Social  Substance Use Topics  . Alcohol use: Yes    Alcohol/week: 3.0 standard drinks    Types: 3 Glasses of wine per week  . Drug use: No     Colonoscopy:  PAP:  Bone density:  Lipid panel:  Allergies  Allergen Reactions  . Peanut-Containing Drug Products Anaphylaxis  . Toradol [Ketorolac Tromethamine] Shortness Of Breath and Itching  . Carrot Oil Itching and Other (See Comments)    Throat itchs  . Other     Green Beans    Current Outpatient Medications  Medication Sig Dispense Refill  . acetaminophen (TYLENOL) 325 MG tablet Take 650 mg by mouth every 6 (six) hours as needed.    Marland Kitchen amLODipine (NORVASC) 5 MG tablet Take 1 tablet (5 mg total) by mouth daily. 90 tablet 1  . cephALEXin (KEFLEX) 500 MG capsule Take 1 capsule (500 mg total) by mouth 4 (four) times daily. 20 capsule 0  . diazepam (VALIUM) 2 MG tablet Take 1 tablet (2 mg total) by mouth every 6 (six) hours as needed for muscle spasms (post op). 20 tablet 0  . HYDROcodone-acetaminophen (NORCO/VICODIN) 5-325 MG tablet Take 1 tablet by mouth every 4 (four) hours as needed for moderate pain (post op). 20 tablet 0  . ondansetron (ZOFRAN) 4 MG tablet Take 1 tablet (4 mg total) by mouth every 8 (eight) hours as needed for nausea or vomiting (post op). 10 tablet 0  . tamoxifen (NOLVADEX) 20 MG tablet Take 1 tablet (20 mg total) by mouth daily. 30 tablet 3   No current facility-administered medications for this visit.     OBJECTIVE: Vitals:   12/04/18 1539 12/04/18 1544   BP: 128/89   Pulse: 73   Resp: 18   Temp:  (!) 96.9 F (36.1 C)     Body mass index is 27.81 kg/m.    ECOG FS:0 - Asymptomatic  General: Well-developed, well-nourished, no acute distress. Eyes: Pink conjunctiva, anicteric sclera. HEENT: Normocephalic, moist mucous membranes. Breast: Bilateral mastectomy with reconstruction. Lungs: Clear to auscultation bilaterally. Heart: Regular rate and rhythm. No rubs, murmurs, or gallops. Abdomen: Soft, nontender, nondistended. No organomegaly noted, normoactive bowel sounds. Musculoskeletal: No edema, cyanosis, or clubbing. Neuro: Alert, answering all questions appropriately. Cranial nerves grossly intact. Skin: No rashes or petechiae noted. Psych: Normal affect.  LAB RESULTS:  Lab Results  Component Value Date   NA 142 07/31/2018   K 4.4 07/31/2018   CL 104 07/31/2018   CO2 24 07/31/2018   GLUCOSE 86 07/31/2018   BUN 17 07/31/2018   CREATININE 1.01 (H) 07/31/2018   CALCIUM 9.3 07/31/2018   GFRNONAA 66 07/31/2018   GFRAA 76 07/31/2018    No results found for: WBC, NEUTROABS, HGB, HCT, MCV, PLT   STUDIES: No results found.  ASSESSMENT: Pathologic stage Ia ER/PR positive, HER-2 negative invasive carcinoma of the upper  outer quadrant of the right breast. DCIS of left breast  PLAN:    1.  Pathologic stage Ia ER/PR positive, HER-2 negative invasive carcinoma of the upper outer quadrant of the right breast: Patient has now completed bilateral mastectomy with reconstruction.  Oncotype DX score was not able to be done secondary to insufficient tissue.  Patient did not require adjuvant chemotherapy or XRT.  No intervention is needed at this time.  Continue tamoxifen for a minimum of 5 years completing in November 2024.  Although given her age and bilateral breast lesions, will likely extend treatment 7 to 10 years.  No further interventions are needed.  Return to clinic in 6 months for routine evaluation.   2. DCIS of left breast:  Tamoxifen as above. 3.  Breast reconstruction: Completed.  Continue follow-up with plastic surgery as scheduled. 4.  Genetic testing: Negative.  Patient expressed understanding and was in agreement with this plan. She also understands that She can call clinic at any time with any questions, concerns, or complaints.   Cancer Staging Malignant neoplasm of upper-outer quadrant of right breast in female, estrogen receptor positive ([place]) Staging form: Breast, AJCC 8th Edition - Clinical stage from 08/26/2018: Stage IA (cT1a, cN0, cM0, G1, ER+, PR+, HER2-) - Signed by Lloyd Huger, MD on 08/26/2018   Lloyd Huger, MD   12/06/2018 7:01 AM  Addendum: Insufficient tissue for further testing with Oncotype DX.  Proceed with tamoxifen as above.   Lloyd Huger, MD 12/06/18 7:01 AM

## 2018-12-04 ENCOUNTER — Inpatient Hospital Stay: Payer: BLUE CROSS/BLUE SHIELD | Attending: Oncology | Admitting: Oncology

## 2018-12-04 VITALS — BP 128/89 | HR 73 | Temp 96.9°F | Resp 18 | Wt 162.0 lb

## 2018-12-04 DIAGNOSIS — Z9013 Acquired absence of bilateral breasts and nipples: Secondary | ICD-10-CM | POA: Diagnosis not present

## 2018-12-04 DIAGNOSIS — D0512 Intraductal carcinoma in situ of left breast: Secondary | ICD-10-CM

## 2018-12-04 DIAGNOSIS — C50411 Malignant neoplasm of upper-outer quadrant of right female breast: Secondary | ICD-10-CM | POA: Diagnosis not present

## 2018-12-04 DIAGNOSIS — Z170 Estrogen receptor positive status [ER+]: Secondary | ICD-10-CM

## 2018-12-04 DIAGNOSIS — Z720 Tobacco use: Secondary | ICD-10-CM | POA: Diagnosis not present

## 2018-12-07 ENCOUNTER — Encounter: Payer: BLUE CROSS/BLUE SHIELD | Admitting: Plastic Surgery

## 2018-12-07 ENCOUNTER — Ambulatory Visit (INDEPENDENT_AMBULATORY_CARE_PROVIDER_SITE_OTHER): Payer: BLUE CROSS/BLUE SHIELD | Admitting: Plastic Surgery

## 2018-12-07 ENCOUNTER — Encounter: Payer: Self-pay | Admitting: Plastic Surgery

## 2018-12-07 VITALS — BP 155/91 | HR 65 | Temp 97.6°F | Ht 64.0 in | Wt 159.6 lb

## 2018-12-07 DIAGNOSIS — Z9013 Acquired absence of bilateral breasts and nipples: Secondary | ICD-10-CM

## 2018-12-07 DIAGNOSIS — Z9889 Other specified postprocedural states: Secondary | ICD-10-CM

## 2018-12-07 NOTE — Progress Notes (Signed)
   Subjective:    Patient ID: Kendra Harrington, female    DOB: 07-18-1971, 48 y.o.   MRN: [id number]  Kendra Harrington is a 48 year old female here with her husband for follow-up on her bilateral breast reconstruction.  She had the expanders removed and silicone implants placed.  We were able to get in Mentor round ultra high profile gel 375 cc implants.  There is no sign of hematoma or seroma.  The incisions are healing well.  Patient had lotion on.  She asked about using vitamin D.  No fevers or cough.  The skin is a little bit tight as anticipated but contour and shape looks very good.  Review of Systems  Constitutional: Negative.   HENT: Negative.   Respiratory: Negative.   Cardiovascular: Negative.   Gastrointestinal: Negative.   Genitourinary: Negative.   Musculoskeletal: Negative.   Skin: Negative.  Negative for color change and wound.  Psychiatric/Behavioral: Negative.        Objective:   Physical Exam Vitals signs and nursing note reviewed.  Constitutional:      Appearance: Normal appearance.  HENT:     Head: Normocephalic and atraumatic.     Nose: Nose normal.     Mouth/Throat:     Mouth: Mucous membranes are moist.  Cardiovascular:     Rate and Rhythm: Normal rate.  Pulmonary:     Effort: Pulmonary effort is normal.  Neurological:     General: No focal deficit present.     Mental Status: She is alert.  Psychiatric:        Mood and Affect: Mood normal.        Assessment & Plan:  Acquired absence of breast and absent nipple, bilateral  S/P breast reconstruction, bilateral  Continue with range of motion exercises of upper extremity.  Continue with sports bra.  Can start [place] in 1 week.  I would like to see her back in 4 weeks.  We talked about the possibility of fat grafting in the future and nipple areole a tattoo in 3 months.

## 2018-12-14 ENCOUNTER — Telehealth: Payer: Self-pay | Admitting: Plastic Surgery

## 2018-12-14 ENCOUNTER — Ambulatory Visit: Payer: Self-pay | Admitting: Family Medicine

## 2018-12-14 ENCOUNTER — Ambulatory Visit (INDEPENDENT_AMBULATORY_CARE_PROVIDER_SITE_OTHER): Payer: BLUE CROSS/BLUE SHIELD | Admitting: Family Medicine

## 2018-12-14 ENCOUNTER — Encounter: Payer: Self-pay | Admitting: Family Medicine

## 2018-12-14 ENCOUNTER — Other Ambulatory Visit: Payer: Self-pay

## 2018-12-14 VITALS — BP 153/103 | HR 67 | Temp 98.7°F | Ht 65.0 in | Wt 158.0 lb

## 2018-12-14 DIAGNOSIS — J029 Acute pharyngitis, unspecified: Secondary | ICD-10-CM

## 2018-12-14 DIAGNOSIS — J301 Allergic rhinitis due to pollen: Secondary | ICD-10-CM | POA: Diagnosis not present

## 2018-12-14 MED ORDER — CETIRIZINE HCL 10 MG PO TABS: 10.0000 mg | ORAL_TABLET | Freq: Every day | ORAL | 11 refills | Status: DC

## 2018-12-14 NOTE — Telephone Encounter (Signed)
Patient called and left voicemail stating that she did not feel well. Patient states that she has elevated blood pressure, coughing, and a sore throat. Unknown if patient sees PCP regularly.

## 2018-12-14 NOTE — Progress Notes (Signed)
BP (!) 153/103   Pulse 67   Temp 98.7 F (37.1 C) (Oral)   Ht 5\' 5"  (1.651 m)   Wt 158 lb (71.7 kg)   SpO2 100%   BMI 26.29 kg/m    Subjective:    Patient ID: Kendra Harrington, female    DOB: 1971-06-09, 48 y.o.   MRN: [id number]  HPI: Kendra Harrington is a 48 y.o. female  Chief Complaint  Patient presents with  . Sore Throat    since yesterday  . Headache  . Fatigue  . Chills   UPPER RESPIRATORY TRACT INFECTION Duration: 1 day Worst symptom: sore throat Fever: no Cough: yes Shortness of breath: yes Wheezing: no Chest pain: no Chest tightness: no Chest congestion: no Nasal congestion: no Runny nose: no Post nasal drip: yes Sneezing: no Sore throat: yes Swollen glands: yes Sinus pressure: no Headache: no Face pain: no Toothache: no Ear pain: no  Ear pressure: no  Eyes red/itching:no Eye drainage/crusting: no  Vomiting: no Rash: no Fatigue: yes Sick contacts: no Strep contacts: no  Context: stable Recurrent sinusitis: no Relief with OTC cold/cough medications: no  Treatments attempted: none   Relevant past medical, surgical, family and social history reviewed and updated as indicated. Interim medical history since our last visit reviewed. Allergies and medications reviewed and updated.  Review of Systems  Constitutional: Positive for chills and fatigue. Negative for activity change, appetite change, diaphoresis, fever and unexpected weight change.  HENT: Positive for postnasal drip and sore throat. Negative for congestion, dental problem, drooling, ear discharge, ear pain, facial swelling, hearing loss, mouth sores, nosebleeds, rhinorrhea, sinus pressure, sinus pain, sneezing, tinnitus, trouble swallowing and voice change.   Eyes: Negative.   Respiratory: Positive for cough. Negative for apnea, choking, chest tightness, shortness of breath, wheezing and stridor.   Cardiovascular: Negative.   Gastrointestinal: Negative.   Neurological: Negative.    Psychiatric/Behavioral: Negative.     Per HPI unless specifically indicated above     Objective:    BP (!) 153/103   Pulse 67   Temp 98.7 F (37.1 C) (Oral)   Ht 5\' 5"  (1.651 m)   Wt 158 lb (71.7 kg)   SpO2 100%   BMI 26.29 kg/m   Wt Readings from Last 3 Encounters:  12/14/18 158 lb (71.7 kg)  12/07/18 159 lb 9.6 oz (72.4 kg)  12/04/18 162 lb (73.5 kg)    Physical Exam Vitals signs and nursing note reviewed.  Constitutional:      General: She is not in acute distress.    Appearance: Normal appearance. She is not ill-appearing, toxic-appearing or diaphoretic.  HENT:     Head: Normocephalic and atraumatic.     Right Ear: Tympanic membrane, ear canal and external ear normal. There is no impacted cerumen.     Left Ear: Tympanic membrane, ear canal and external ear normal. There is no impacted cerumen.     Nose: Congestion and rhinorrhea present.     Mouth/Throat:     Mouth: Mucous membranes are moist.     Pharynx: Oropharynx is clear. No oropharyngeal exudate or posterior oropharyngeal erythema.  Eyes:     General: No scleral icterus.       Right eye: No discharge.        Left eye: No discharge.     Extraocular Movements: Extraocular movements intact.     Conjunctiva/sclera: Conjunctivae normal.     Pupils: Pupils are equal, round, and reactive to light.  Neck:  Musculoskeletal: Normal range of motion and neck supple. No neck rigidity or muscular tenderness.     Vascular: No carotid bruit.  Cardiovascular:     Rate and Rhythm: Normal rate and regular rhythm.     Pulses: Normal pulses.     Heart sounds: Normal heart sounds. No murmur. No friction rub. No gallop.   Pulmonary:     Effort: Pulmonary effort is normal. No respiratory distress.     Breath sounds: Normal breath sounds. No stridor. No wheezing, rhonchi or rales.  Chest:     Chest wall: No tenderness.  Musculoskeletal: Normal range of motion.  Lymphadenopathy:     Cervical: Cervical adenopathy present.   Skin:    General: Skin is warm and dry.     Capillary Refill: Capillary refill takes less than 2 seconds.     Coloration: Skin is not jaundiced or pale.     Findings: No bruising, erythema, lesion or rash.  Neurological:     General: No focal deficit present.     Mental Status: She is alert and oriented to person, place, and time. Mental status is at baseline.     Cranial Nerves: No cranial nerve deficit.     Sensory: No sensory deficit.     Motor: No weakness.     Coordination: Coordination normal.     Gait: Gait normal.     Deep Tendon Reflexes: Reflexes normal.  Psychiatric:        Mood and Affect: Mood normal.        Behavior: Behavior normal.        Thought Content: Thought content normal.        Judgment: Judgment normal.     Results for orders placed or performed during the hospital encounter of 11/28/18  Pregnancy, urine POC  Result Value Ref Range   Preg Test, Ur NEGATIVE NEGATIVE      Assessment & Plan:   Problem List Items Addressed This Visit    None    Visit Diagnoses    Seasonal allergic rhinitis due to pollen    -  Primary   Will treat with zyrtec. Call if not getting better or getting worse.    Sore throat       Flu and strep negative.    Relevant Orders   Rapid Strep Screen ([facility] ONLY)   Veritor Flu A/B Waived       Follow up plan: Return if symptoms worsen or fail to improve.

## 2018-12-14 NOTE — Telephone Encounter (Signed)
Pt. Reports she has a non-productive cough, sore throat. Has shortness of breath with exertion. No fever. Concerned because she has had recent surgery for breast cancer and wants to be well. Request early appointment if possible.Appointment made for this morning.  Reason for Disposition . [1] Continuous (nonstop) coughing interferes with work or school AND [2] no improvement using cough treatment per protocol  Answer Assessment - Initial Assessment Questions 1. ONSET: "When did the cough begin?"      This morning 2. SEVERITY: "How bad is the cough today?"      Mild 3. RESPIRATORY DISTRESS: "Describe your breathing."      Some shortness of breath 4. FEVER: "Do you have a fever?" If so, ask: "What is your temperature, how was it measured, and when did it start?"     No 5. HEMOPTYSIS: "Are you coughing up any blood?" If so ask: "How much?" (flecks, streaks, tablespoons, etc.)     No 6. TREATMENT: "What have you done so far to treat the cough?" (e.g., meds, fluids, humidifier)     None 7. CARDIAC HISTORY: "Do you have any history of heart disease?" (e.g., heart attack, congestive heart failure)      No 8. LUNG HISTORY: "Do you have any history of lung disease?"  (e.g., pulmonary embolus, asthma, emphysema)     No 9. PE RISK FACTORS: "Do you have a history of blood clots?" (or: recent major surgery, recent prolonged travel, bedridden)     No 10. OTHER SYMPTOMS: "Do you have any other symptoms? (e.g., runny nose, wheezing, chest pain)       Sore throat, cough 11. PREGNANCY: "Is there any chance you are pregnant?" "When was your last menstrual period?"       No 12. TRAVEL: "Have you traveled out of the country in the last month?" (e.g., travel history, exposures)       No  Protocols used: COUGH - ACUTE NON-PRODUCTIVE-A-AH

## 2018-12-17 LAB — VERITOR FLU A/B WAIVED
INFLUENZA A: NEGATIVE
Influenza B: NEGATIVE

## 2018-12-17 LAB — RAPID STREP SCREEN (MED CTR MEBANE ONLY): Strep Gp A Ag, IA W/Reflex: NEGATIVE

## 2018-12-17 LAB — CULTURE, GROUP A STREP: Strep A Culture: NEGATIVE

## 2018-12-19 NOTE — Telephone Encounter (Addendum)
Called and spoke with the patient on (12/14/18) regarding the message below.  Patient stated that she went to see her PCP this morning and they ran some test.  They swabbed her nose and throat.  Asked the patient if she knew what kind of test they ran and what the results where.  Patient stated that she did not,but she did asked if I could look in the system and see what she had done and what the results are.  Informed the patient that she had a flu and strept test done, and both test were negative.  Patient also wanted to know if she is able to take a bubble bath.  Informed the patient that I spoke with [facility] and she said she can soil in the tub but make sure the breast are not getting wet.  Patient verbalized understanding and agreed.//AB/CMA

## 2019-01-07 ENCOUNTER — Telehealth: Payer: Self-pay | Admitting: Plastic Surgery

## 2019-01-07 NOTE — Telephone Encounter (Signed)
Left voicemail for patient to call back for COVID screening prior to appointment tomorrow.

## 2019-01-08 ENCOUNTER — Ambulatory Visit (INDEPENDENT_AMBULATORY_CARE_PROVIDER_SITE_OTHER): Payer: BLUE CROSS/BLUE SHIELD | Admitting: Plastic Surgery

## 2019-01-08 ENCOUNTER — Other Ambulatory Visit: Payer: Self-pay

## 2019-01-08 ENCOUNTER — Encounter: Payer: Self-pay | Admitting: Plastic Surgery

## 2019-01-08 VITALS — BP 135/90 | HR 65 | Temp 97.7°F | Ht 64.0 in | Wt 161.0 lb

## 2019-01-08 DIAGNOSIS — Z9889 Other specified postprocedural states: Secondary | ICD-10-CM

## 2019-01-08 DIAGNOSIS — Z9013 Acquired absence of bilateral breasts and nipples: Secondary | ICD-10-CM

## 2019-01-08 NOTE — Progress Notes (Signed)
The patient is a 48 year old female here with her husband for postoperative follow-up.  She is a little disappointed at how it looks.  She would like more projection.  The skin is loosening up its is still tight and the implant is still a little high.  This will improve over the next several weeks and months.  Her incisions are healing very nicely and there is no sign of seroma or infection.  She may need fat grafting and this could be done at any time.  She is also a candidate for nipple areola tattoo reconstruction.  Pictures placed in epic with patient permission. Follow-up in month and let us know if she wants to do fat grafting.

## 2019-01-22 ENCOUNTER — Other Ambulatory Visit: Payer: Self-pay | Admitting: Oncology

## 2019-02-01 ENCOUNTER — Encounter: Payer: BC Managed Care – PPO | Admitting: Family Medicine

## 2019-02-11 ENCOUNTER — Other Ambulatory Visit: Payer: Self-pay

## 2019-02-11 ENCOUNTER — Encounter: Payer: Self-pay | Admitting: Plastic Surgery

## 2019-02-11 ENCOUNTER — Ambulatory Visit (INDEPENDENT_AMBULATORY_CARE_PROVIDER_SITE_OTHER): Payer: BLUE CROSS/BLUE SHIELD | Admitting: Plastic Surgery

## 2019-02-11 VITALS — BP 151/102 | HR 60 | Temp 97.9°F | Ht 64.0 in | Wt 159.4 lb

## 2019-02-11 DIAGNOSIS — Z9889 Other specified postprocedural states: Secondary | ICD-10-CM

## 2019-02-11 DIAGNOSIS — Z9013 Acquired absence of bilateral breasts and nipples: Secondary | ICD-10-CM

## 2019-02-11 DIAGNOSIS — N651 Disproportion of reconstructed breast: Secondary | ICD-10-CM

## 2019-02-11 MED ORDER — OXYCODONE-ACETAMINOPHEN 7.5-325 MG PO TABS: 1.0000 | ORAL_TABLET | Freq: Three times a day (TID) | ORAL | 0 refills | Status: AC | PRN

## 2019-02-11 MED ORDER — ONDANSETRON HCL 4 MG PO TABS: 4.0000 mg | ORAL_TABLET | Freq: Three times a day (TID) | ORAL | 0 refills | Status: AC | PRN

## 2019-02-11 MED ORDER — CEPHALEXIN 500 MG PO CAPS: 500.0000 mg | ORAL_CAPSULE | Freq: Four times a day (QID) | ORAL | 0 refills | Status: AC

## 2019-02-11 NOTE — Progress Notes (Signed)
Patient ID: Cassandra Tran, female    DOB: [birth year], 48 y.o.   MRN: [id number]   Chief Complaint  Patient presents with  . Pre-op Exam  . Breast Problem    The patient is a 48 year old female here with her husband for preoperative history and physical.  She underwent bilateral mastectomies with reconstruction.  Her silicone implants are in place.  She is pleased with her results.  She has a little bit of asymmetry and hollowing in the upper poles.  She is planning on undergoing fat grafting for improvement in symmetry.  She has not had any recent illnesses.  She has been sheltering in place due to the [place].   Review of Systems  Constitutional: Negative.   HENT: Negative.   Eyes: Negative.   Respiratory: Negative.  Negative for chest tightness and shortness of breath.   Cardiovascular: Negative.  Negative for leg swelling.  Gastrointestinal: Negative.  Negative for abdominal pain.  Endocrine: Negative.   Genitourinary: Negative.   Musculoskeletal: Negative.   Skin: Negative for wound.  Psychiatric/Behavioral: Negative.     Past Medical History:  Diagnosis Date  . BRCA negative 12/26/2017   [facility]   . Cancer ([place]) 11/20/2017   DUCTAL CARCINOMA IN SITU left breast  . GERD (gastroesophageal reflux disease)    OCC-NO MEDS  . Hypertension     Past Surgical History:  Procedure Laterality Date  . BREAST BIOPSY Right 02/21/2014   upper-outer calcs=atypical ductal hyperplasia and background of columnar cell change w/ calcifications. cytologic atypia per rad report from [place].  Marland Kitchen BREAST BIOPSY Right 02/21/2014   Central Right calcs=atypical ductal hyperplasia with atypical lobular hyperplasia in a background of columnar cell change with calcifications and papillary apocrine metaplasia per rad report from [place]  . BREAST BIOPSY Left 11/20/2017   Affirm Bx- DUCTAL CARCINOMA IN SITU  . BREAST BIOPSY Right 11/20/2017   Affirm Bx of 2 areas-  ATYPICAL DUCTAL HYPERPLASIA and  PSEUDO-ANGIOMATOUS STROMAL HYPERPLASIA  . BREAST BIOPSY Bilateral 05/18/2018   Procedure: BREAST BIOPSY WITH NEEDLE LOCALIZATION;  Surgeon: Robert Bellow, MD;  Location: [facility];  Service: General;  Laterality: Bilateral;  . BREAST EXCISIONAL BIOPSY Right 2015   "Benign" per pt  . BREAST EXCISIONAL BIOPSY Left 05/18/2018   NL for  lumpectomy - DCIS  . BREAST EXCISIONAL BIOPSY Right 05/18/2018   NL for lumpectomy for 2 areas of atypical ductal hyperplasia   . BREAST RECONSTRUCTION WITH PLACEMENT OF TISSUE EXPANDER AND FLEX HD (ACELLULAR HYDRATED DERMIS) Bilateral 08/08/2018   Procedure: BREAST RECONSTRUCTION WITH PLACEMENT OF TISSUE EXPANDER AND FLEX HD (ACELLULAR HYDRATED DERMIS);  Surgeon: Wallace Going, DO;  Location: [facility];  Service: Plastics;  Laterality: Bilateral;  . CESAREAN SECTION    . MASTECTOMY W/ SENTINEL NODE BIOPSY Bilateral 08/08/2018   Procedure: MASTECTOMY WITH SENTINEL LYMPH NODE BIOPSY;  Surgeon: Robert Bellow, MD;  Location: [facility];  Service: General;  Laterality: Bilateral;  . REMOVAL OF BILATERAL TISSUE EXPANDERS WITH PLACEMENT OF BILATERAL BREAST IMPLANTS Bilateral 11/28/2018   Procedure: REMOVAL OF BILATERAL TISSUE EXPANDERS WITH PLACEMENT OF BILATERAL BREAST IMPLANTS;  Surgeon: Wallace Going, DO;  Location: [place];  Service: Plastics;  Laterality: Bilateral;  120 min, please      Current Outpatient Medications:  .  amLODipine (NORVASC) 5 MG tablet, Take 1 tablet (5 mg total) by mouth daily., Disp: 90 tablet, Rfl: 1 .  cetirizine (ZYRTEC) 10 MG tablet, Take 1 tablet (10 mg total)  by mouth daily., Disp: 30 tablet, Rfl: 11 .  tamoxifen (NOLVADEX) 20 MG tablet, TAKE 1 TABLET BY MOUTH EVERY DAY, Disp: 90 tablet, Rfl: 3 .  acetaminophen (TYLENOL) 325 MG tablet, Take 650 mg by mouth every 6 (six) hours as needed., Disp: , Rfl:    Objective:   Vitals:   02/11/19 0906  BP: (!) 151/102  Pulse: 60  Temp: 97.9 F (36.6 C)   SpO2: 100%    Physical Exam Vitals signs and nursing note reviewed.  Constitutional:      Appearance: Normal appearance.  HENT:     Head: Normocephalic and atraumatic.     Nose: Nose normal.     Mouth/Throat:     Mouth: Mucous membranes are moist.  Cardiovascular:     Rate and Rhythm: Normal rate.  Pulmonary:     Effort: Pulmonary effort is normal. No respiratory distress.  Abdominal:     General: Abdomen is flat. There is no distension.     Tenderness: There is no abdominal tenderness.  Neurological:     General: No focal deficit present.     Mental Status: She is alert and oriented to person, place, and time.  Psychiatric:        Mood and Affect: Mood normal.     Assessment & Plan:  Acquired absence of breast and absent nipple, bilateral  S/P breast reconstruction, bilateral  Breast asymmetry following reconstructive surgery We spent time discussing risks and complications with the liposuction.  She is aware that it will not increase her size but improve her symmetry and shape. Prescriptions were sent into pharmacy.  The risks that can be encountered with and after liposuction were discussed and include the following but no limited to these:  Asymmetry, fluid accumulation, firmness of the area, fat necrosis with death of fat tissue, bleeding, infection, delayed healing, anesthesia risks, skin sensation changes, injury to structures including nerves, blood vessels, and muscles which may be temporary or permanent, allergies to tape, suture materials and glues, blood products, topical preparations or injected agents, skin and contour irregularities, skin discoloration and swelling, deep vein thrombosis, cardiac and pulmonary complications, pain, which may persist, persistent pain, recurrence of the lesion, poor healing of the incision, possible need for revisional surgery or staged procedures. Thiere can also be persistent swelling, poor wound healing, rippling or loose skin,  worsening of cellulite, swelling, and thermal burn or heat injury from ultrasound with the ultrasound-assisted lipoplasty technique. Any change in weight fluctuations can alter the outcome.    [place], DO

## 2019-02-11 NOTE — H&P (View-Only) (Signed)
Patient ID: Kendra Harrington, female    DOB: 06-30-1971, 48 y.o.   MRN: [id number]   Chief Complaint  Patient presents with  . Pre-op Exam  . Breast Problem    The patient is a 48 year old female here with her husband for preoperative history and physical.  She underwent bilateral mastectomies with reconstruction.  Her silicone implants are in place.  She is pleased with her results.  She has a little bit of asymmetry and hollowing in the upper poles.  She is planning on undergoing fat grafting for improvement in symmetry.  She has not had any recent illnesses.  She has been sheltering in place due to the [place].   Review of Systems  Constitutional: Negative.   HENT: Negative.   Eyes: Negative.   Respiratory: Negative.  Negative for chest tightness and shortness of breath.   Cardiovascular: Negative.  Negative for leg swelling.  Gastrointestinal: Negative.  Negative for abdominal pain.  Endocrine: Negative.   Genitourinary: Negative.   Musculoskeletal: Negative.   Skin: Negative for wound.  Psychiatric/Behavioral: Negative.     Past Medical History:  Diagnosis Date  . BRCA negative 12/26/2017   [facility]   . Cancer ([place]) 11/20/2017   DUCTAL CARCINOMA IN SITU left breast  . GERD (gastroesophageal reflux disease)    OCC-NO MEDS  . Hypertension     Past Surgical History:  Procedure Laterality Date  . BREAST BIOPSY Right 02/21/2014   upper-outer calcs=atypical ductal hyperplasia and background of columnar cell change w/ calcifications. cytologic atypia per rad report from [place].  Marland Kitchen BREAST BIOPSY Right 02/21/2014   Central Right calcs=atypical ductal hyperplasia with atypical lobular hyperplasia in a background of columnar cell change with calcifications and papillary apocrine metaplasia per rad report from [place]  . BREAST BIOPSY Left 11/20/2017   Affirm Bx- DUCTAL CARCINOMA IN SITU  . BREAST BIOPSY Right 11/20/2017   Affirm Bx of 2 areas-  ATYPICAL DUCTAL HYPERPLASIA and  PSEUDO-ANGIOMATOUS STROMAL HYPERPLASIA  . BREAST BIOPSY Bilateral 05/18/2018   Procedure: BREAST BIOPSY WITH NEEDLE LOCALIZATION;  Surgeon: Robert Bellow, MD;  Location: [facility];  Service: General;  Laterality: Bilateral;  . BREAST EXCISIONAL BIOPSY Right 2015   "Benign" per pt  . BREAST EXCISIONAL BIOPSY Left 05/18/2018   NL for  lumpectomy - DCIS  . BREAST EXCISIONAL BIOPSY Right 05/18/2018   NL for lumpectomy for 2 areas of atypical ductal hyperplasia   . BREAST RECONSTRUCTION WITH PLACEMENT OF TISSUE EXPANDER AND FLEX HD (ACELLULAR HYDRATED DERMIS) Bilateral 08/08/2018   Procedure: BREAST RECONSTRUCTION WITH PLACEMENT OF TISSUE EXPANDER AND FLEX HD (ACELLULAR HYDRATED DERMIS);  Surgeon: Wallace Going, DO;  Location: [facility];  Service: Plastics;  Laterality: Bilateral;  . CESAREAN SECTION    . MASTECTOMY W/ SENTINEL NODE BIOPSY Bilateral 08/08/2018   Procedure: MASTECTOMY WITH SENTINEL LYMPH NODE BIOPSY;  Surgeon: Robert Bellow, MD;  Location: [facility];  Service: General;  Laterality: Bilateral;  . REMOVAL OF BILATERAL TISSUE EXPANDERS WITH PLACEMENT OF BILATERAL BREAST IMPLANTS Bilateral 11/28/2018   Procedure: REMOVAL OF BILATERAL TISSUE EXPANDERS WITH PLACEMENT OF BILATERAL BREAST IMPLANTS;  Surgeon: Wallace Going, DO;  Location: [place];  Service: Plastics;  Laterality: Bilateral;  120 min, please      Current Outpatient Medications:  .  amLODipine (NORVASC) 5 MG tablet, Take 1 tablet (5 mg total) by mouth daily., Disp: 90 tablet, Rfl: 1 .  cetirizine (ZYRTEC) 10 MG tablet, Take 1 tablet (10 mg total)  by mouth daily., Disp: 30 tablet, Rfl: 11 .  tamoxifen (NOLVADEX) 20 MG tablet, TAKE 1 TABLET BY MOUTH EVERY DAY, Disp: 90 tablet, Rfl: 3 .  acetaminophen (TYLENOL) 325 MG tablet, Take 650 mg by mouth every 6 (six) hours as needed., Disp: , Rfl:    Objective:   Vitals:   02/11/19 0906  BP: (!) 151/102  Pulse: 60  Temp: 97.9 F (36.6 C)   SpO2: 100%    Physical Exam Vitals signs and nursing note reviewed.  Constitutional:      Appearance: Normal appearance.  HENT:     Head: Normocephalic and atraumatic.     Nose: Nose normal.     Mouth/Throat:     Mouth: Mucous membranes are moist.  Cardiovascular:     Rate and Rhythm: Normal rate.  Pulmonary:     Effort: Pulmonary effort is normal. No respiratory distress.  Abdominal:     General: Abdomen is flat. There is no distension.     Tenderness: There is no abdominal tenderness.  Neurological:     General: No focal deficit present.     Mental Status: She is alert and oriented to person, place, and time.  Psychiatric:        Mood and Affect: Mood normal.     Assessment & Plan:  Acquired absence of breast and absent nipple, bilateral  S/P breast reconstruction, bilateral  Breast asymmetry following reconstructive surgery We spent time discussing risks and complications with the liposuction.  She is aware that it will not increase her size but improve her symmetry and shape. Prescriptions were sent into pharmacy.  The risks that can be encountered with and after liposuction were discussed and include the following but no limited to these:  Asymmetry, fluid accumulation, firmness of the area, fat necrosis with death of fat tissue, bleeding, infection, delayed healing, anesthesia risks, skin sensation changes, injury to structures including nerves, blood vessels, and muscles which may be temporary or permanent, allergies to tape, suture materials and glues, blood products, topical preparations or injected agents, skin and contour irregularities, skin discoloration and swelling, deep vein thrombosis, cardiac and pulmonary complications, pain, which may persist, persistent pain, recurrence of the lesion, poor healing of the incision, possible need for revisional surgery or staged procedures. Thiere can also be persistent swelling, poor wound healing, rippling or loose skin,  worsening of cellulite, swelling, and thermal burn or heat injury from ultrasound with the ultrasound-assisted lipoplasty technique. Any change in weight fluctuations can alter the outcome.    [place], DO

## 2019-02-16 ENCOUNTER — Other Ambulatory Visit: Payer: Self-pay | Admitting: Family Medicine

## 2019-02-16 NOTE — Telephone Encounter (Signed)
Requested Prescriptions  Pending Prescriptions Disp Refills  . amLODipine (NORVASC) 5 MG tablet [Pharmacy Med Name: AMLODIPINE BESYLATE 5 MG TAB] 90 tablet 0    Sig: TAKE 1 TABLET BY MOUTH EVERY DAY     Cardiovascular:  Calcium Channel Blockers Failed - 02/16/2019 12:04 PM      Failed - Last BP in normal range    BP Readings from Last 1 Encounters:  02/11/19 (!) 151/102         Passed - Valid encounter within last 6 months    Recent Outpatient Visits          2 months ago Seasonal allergic rhinitis due to pollen   [facility], [place], DO   6 months ago Essential hypertension   [facility] Volney American, [place]   1 year ago Ductal carcinoma in situ (DCIS) of left breast   [facility] Kathrine Haddock, NP   1 year ago Essential hypertension   [facility] Volney American, [place]      Future Appointments            In 3 months Orene Desanctis, Lilia Argue, [place], [place]

## 2019-02-28 ENCOUNTER — Encounter (HOSPITAL_BASED_OUTPATIENT_CLINIC_OR_DEPARTMENT_OTHER): Payer: Self-pay | Admitting: *Deleted

## 2019-02-28 ENCOUNTER — Other Ambulatory Visit: Payer: Self-pay

## 2019-03-01 ENCOUNTER — Other Ambulatory Visit
Admission: RE | Admit: 2019-03-01 | Discharge: 2019-03-01 | Disposition: A | Discharge status: Home / Self Care | Payer: BLUE CROSS/BLUE SHIELD | Source: Ambulatory Visit | Attending: Plastic Surgery | Admitting: Plastic Surgery

## 2019-03-04 ENCOUNTER — Encounter
Admission: RE | Admit: 2019-03-04 | Discharge: 2019-03-04 | Disposition: A | Discharge status: Home / Self Care | Payer: BC Managed Care – PPO | Source: Ambulatory Visit | Attending: Plastic Surgery | Admitting: Plastic Surgery

## 2019-03-04 ENCOUNTER — Other Ambulatory Visit: Payer: Self-pay

## 2019-03-04 DIAGNOSIS — Z01812 Encounter for preprocedural laboratory examination: Secondary | ICD-10-CM | POA: Insufficient documentation

## 2019-03-04 DIAGNOSIS — Z1159 Encounter for screening for other viral diseases: Secondary | ICD-10-CM | POA: Insufficient documentation

## 2019-03-04 LAB — SARS CORONAVIRUS 2 BY RT PCR (HOSPITAL ORDER, PERFORMED IN ~~LOC~~ HOSPITAL LAB): SARS Coronavirus 2: NEGATIVE

## 2019-03-06 ENCOUNTER — Ambulatory Visit (HOSPITAL_BASED_OUTPATIENT_CLINIC_OR_DEPARTMENT_OTHER): Payer: BC Managed Care – PPO | Admitting: Anesthesiology

## 2019-03-06 ENCOUNTER — Encounter (HOSPITAL_BASED_OUTPATIENT_CLINIC_OR_DEPARTMENT_OTHER): Payer: Self-pay | Admitting: Emergency Medicine

## 2019-03-06 ENCOUNTER — Other Ambulatory Visit: Payer: Self-pay

## 2019-03-06 ENCOUNTER — Encounter (HOSPITAL_BASED_OUTPATIENT_CLINIC_OR_DEPARTMENT_OTHER)
Admission: RE | Disposition: A | Discharge status: Home / Self Care | Payer: Self-pay | Source: Home / Self Care | Attending: Plastic Surgery

## 2019-03-06 ENCOUNTER — Ambulatory Visit (HOSPITAL_BASED_OUTPATIENT_CLINIC_OR_DEPARTMENT_OTHER)
Admission: RE | Admit: 2019-03-06 | Discharge: 2019-03-06 | Disposition: A | Discharge status: Home / Self Care | Payer: BC Managed Care – PPO | Attending: Plastic Surgery | Admitting: Plastic Surgery

## 2019-03-06 DIAGNOSIS — K219 Gastro-esophageal reflux disease without esophagitis: Secondary | ICD-10-CM | POA: Diagnosis not present

## 2019-03-06 DIAGNOSIS — Z79899 Other long term (current) drug therapy: Secondary | ICD-10-CM | POA: Diagnosis not present

## 2019-03-06 DIAGNOSIS — N651 Disproportion of reconstructed breast: Secondary | ICD-10-CM | POA: Diagnosis present

## 2019-03-06 DIAGNOSIS — I10 Essential (primary) hypertension: Secondary | ICD-10-CM | POA: Diagnosis not present

## 2019-03-06 DIAGNOSIS — Z853 Personal history of malignant neoplasm of breast: Secondary | ICD-10-CM | POA: Insufficient documentation

## 2019-03-06 DIAGNOSIS — Z9013 Acquired absence of bilateral breasts and nipples: Secondary | ICD-10-CM

## 2019-03-06 DIAGNOSIS — Z9882 Breast implant status: Secondary | ICD-10-CM

## 2019-03-06 DIAGNOSIS — Z170 Estrogen receptor positive status [ER+]: Secondary | ICD-10-CM | POA: Diagnosis not present

## 2019-03-06 DIAGNOSIS — C50411 Malignant neoplasm of upper-outer quadrant of right female breast: Secondary | ICD-10-CM | POA: Diagnosis not present

## 2019-03-06 HISTORY — PX: LIPOSUCTION WITH LIPOFILLING: SHX6436

## 2019-03-06 SURGERY — LIPOSUCTION, WITH FAT TRANSFER
Anesthesia: General | Site: Breast | Laterality: Bilateral

## 2019-03-06 MED ORDER — ONDANSETRON HCL 4 MG/2ML IJ SOLN
INTRAMUSCULAR | Status: AC
  Filled 2019-03-06: qty 2

## 2019-03-06 MED ORDER — BUPIVACAINE-EPINEPHRINE (PF) 0.25% -1:200000 IJ SOLN
INTRAMUSCULAR | Status: AC
  Filled 2019-03-06: qty 30

## 2019-03-06 MED ORDER — SCOPOLAMINE 1 MG/3DAYS TD PT72: 1.0000 | MEDICATED_PATCH | Freq: Once | TRANSDERMAL | Status: DC | PRN

## 2019-03-06 MED ORDER — FENTANYL CITRATE (PF) 100 MCG/2ML IJ SOLN
INTRAMUSCULAR | Status: AC
  Filled 2019-03-06: qty 2

## 2019-03-06 MED ORDER — DEXAMETHASONE SODIUM PHOSPHATE 10 MG/ML IJ SOLN
INTRAMUSCULAR | Status: AC
  Filled 2019-03-06: qty 1

## 2019-03-06 MED ORDER — LIDOCAINE 2% (20 MG/ML) 5 ML SYRINGE
INTRAMUSCULAR | Status: AC
  Filled 2019-03-06: qty 5

## 2019-03-06 MED ORDER — MEPERIDINE HCL 25 MG/ML IJ SOLN: 6.2500 mg | INTRAMUSCULAR | Status: DC | PRN

## 2019-03-06 MED ORDER — LIDOCAINE-EPINEPHRINE 1 %-1:100000 IJ SOLN
INTRAMUSCULAR | Status: DC | PRN
  Administered 2019-03-06: 6 mL

## 2019-03-06 MED ORDER — EPHEDRINE 5 MG/ML INJ
INTRAVENOUS | Status: AC
  Filled 2019-03-06: qty 10

## 2019-03-06 MED ORDER — PROPOFOL 10 MG/ML IV BOLUS
INTRAVENOUS | Status: DC | PRN
  Administered 2019-03-06: 150 mg via INTRAVENOUS

## 2019-03-06 MED ORDER — PROMETHAZINE HCL 25 MG/ML IJ SOLN: 6.2500 mg | INTRAMUSCULAR | Status: DC | PRN

## 2019-03-06 MED ORDER — SUCCINYLCHOLINE CHLORIDE 200 MG/10ML IV SOSY
PREFILLED_SYRINGE | INTRAVENOUS | Status: AC
  Filled 2019-03-06: qty 10

## 2019-03-06 MED ORDER — LACTATED RINGERS IV SOLN
INTRAVENOUS | Status: DC
  Administered 2019-03-06 (×2): via INTRAVENOUS

## 2019-03-06 MED ORDER — ACETAMINOPHEN 160 MG/5ML PO SOLN: 325.0000 mg | Freq: Once | ORAL | Status: DC | PRN

## 2019-03-06 MED ORDER — OXYCODONE HCL 5 MG PO TABS: 5.0000 mg | ORAL_TABLET | Freq: Once | ORAL | Status: DC | PRN

## 2019-03-06 MED ORDER — PHENYLEPHRINE 40 MCG/ML (10ML) SYRINGE FOR IV PUSH (FOR BLOOD PRESSURE SUPPORT)
PREFILLED_SYRINGE | INTRAVENOUS | Status: AC
  Filled 2019-03-06: qty 10

## 2019-03-06 MED ORDER — EPHEDRINE SULFATE 50 MG/ML IJ SOLN
INTRAMUSCULAR | Status: DC | PRN
  Administered 2019-03-06 (×4): 10 mg via INTRAVENOUS

## 2019-03-06 MED ORDER — FENTANYL CITRATE (PF) 100 MCG/2ML IJ SOLN
50.0000 ug | INTRAMUSCULAR | Status: AC | PRN
  Administered 2019-03-06: 50 ug via INTRAVENOUS
  Administered 2019-03-06: 25 ug via INTRAVENOUS
  Administered 2019-03-06: 100 ug via INTRAVENOUS

## 2019-03-06 MED ORDER — LIDOCAINE HCL (CARDIAC) PF 100 MG/5ML IV SOSY
PREFILLED_SYRINGE | INTRAVENOUS | Status: DC | PRN
  Administered 2019-03-06: 100 mg via INTRAVENOUS

## 2019-03-06 MED ORDER — CHLORHEXIDINE GLUCONATE 4 % EX LIQD: 1.0000 "application " | Freq: Once | CUTANEOUS | Status: DC

## 2019-03-06 MED ORDER — MIDAZOLAM HCL 2 MG/2ML IJ SOLN
INTRAMUSCULAR | Status: AC
  Filled 2019-03-06: qty 2

## 2019-03-06 MED ORDER — DEXAMETHASONE SODIUM PHOSPHATE 4 MG/ML IJ SOLN
INTRAMUSCULAR | Status: DC | PRN
  Administered 2019-03-06: 10 mg via INTRAVENOUS

## 2019-03-06 MED ORDER — ACETAMINOPHEN 325 MG PO TABS: 325.0000 mg | ORAL_TABLET | Freq: Once | ORAL | Status: DC | PRN

## 2019-03-06 MED ORDER — CEFAZOLIN SODIUM-DEXTROSE 2-4 GM/100ML-% IV SOLN
INTRAVENOUS | Status: AC
  Filled 2019-03-06: qty 100

## 2019-03-06 MED ORDER — CEFAZOLIN SODIUM-DEXTROSE 2-4 GM/100ML-% IV SOLN
2.0000 g | INTRAVENOUS | Status: AC
  Administered 2019-03-06: 2 g via INTRAVENOUS

## 2019-03-06 MED ORDER — LIDOCAINE HCL 1 % IJ SOLN
INTRAVENOUS | Status: DC | PRN
  Administered 2019-03-06: 750 mL

## 2019-03-06 MED ORDER — HYDROMORPHONE HCL 1 MG/ML IJ SOLN: 0.2500 mg | INTRAMUSCULAR | Status: DC | PRN

## 2019-03-06 MED ORDER — PHENYLEPHRINE HCL (PRESSORS) 10 MG/ML IV SOLN
INTRAVENOUS | Status: DC | PRN
  Administered 2019-03-06 (×3): 80 ug via INTRAVENOUS

## 2019-03-06 MED ORDER — LACTATED RINGERS IV SOLN: INTRAVENOUS | Status: DC

## 2019-03-06 MED ORDER — ACETAMINOPHEN 10 MG/ML IV SOLN: 1000.0000 mg | Freq: Once | INTRAVENOUS | Status: DC | PRN

## 2019-03-06 MED ORDER — ONDANSETRON HCL 4 MG/2ML IJ SOLN
INTRAMUSCULAR | Status: DC | PRN
  Administered 2019-03-06: 4 mg via INTRAVENOUS

## 2019-03-06 MED ORDER — MIDAZOLAM HCL 2 MG/2ML IJ SOLN
1.0000 mg | INTRAMUSCULAR | Status: DC | PRN
  Administered 2019-03-06: 2 mg via INTRAVENOUS

## 2019-03-06 MED ORDER — OXYCODONE HCL 5 MG/5ML PO SOLN: 5.0000 mg | Freq: Once | ORAL | Status: DC | PRN

## 2019-03-06 MED ORDER — PROPOFOL 10 MG/ML IV BOLUS
INTRAVENOUS | Status: AC
  Filled 2019-03-06: qty 20

## 2019-03-06 SURGICAL SUPPLY — 53 items
BINDER ABDOMINAL  9 SM 30-45 (SOFTGOODS)
BINDER ABDOMINAL 10 UNV 27-48 (MISCELLANEOUS) ×3 IMPLANT
BINDER ABDOMINAL 12 SM 30-45 (SOFTGOODS) IMPLANT
BINDER ABDOMINAL 9 SM 30-45 (SOFTGOODS) IMPLANT
BINDER BREAST LRG (GAUZE/BANDAGES/DRESSINGS) IMPLANT
BINDER BREAST MEDIUM (GAUZE/BANDAGES/DRESSINGS) IMPLANT
BINDER BREAST XLRG (GAUZE/BANDAGES/DRESSINGS) ×3 IMPLANT
BINDER BREAST XXLRG (GAUZE/BANDAGES/DRESSINGS) IMPLANT
BLADE HEX COATED 2.75 (ELECTRODE) IMPLANT
BLADE SURG 15 STRL LF DISP TIS (BLADE) ×1 IMPLANT
BLADE SURG 15 STRL SS (BLADE) ×2
BNDG GAUZE ELAST 4 BULKY (GAUZE/BANDAGES/DRESSINGS) IMPLANT
CHLORAPREP W/TINT 26 (MISCELLANEOUS) ×3 IMPLANT
COVER BACK TABLE REUSABLE LG (DRAPES) ×3 IMPLANT
COVER MAYO STAND REUSABLE (DRAPES) ×3 IMPLANT
COVER WAND RF STERILE (DRAPES) IMPLANT
DECANTER SPIKE VIAL GLASS SM (MISCELLANEOUS) IMPLANT
DERMABOND ADVANCED (GAUZE/BANDAGES/DRESSINGS) ×2
DERMABOND ADVANCED .7 DNX12 (GAUZE/BANDAGES/DRESSINGS) ×1 IMPLANT
DRAPE LAPAROSCOPIC ABDOMINAL (DRAPES) ×3 IMPLANT
DRSG PAD ABDOMINAL 8X10 ST (GAUZE/BANDAGES/DRESSINGS) ×6 IMPLANT
ELECT REM PT RETURN 9FT ADLT (ELECTROSURGICAL)
ELECTRODE REM PT RTRN 9FT ADLT (ELECTROSURGICAL) IMPLANT
EXTRACTOR CANIST REVOLVE STRL (CANNISTER) ×3 IMPLANT
GAUZE SPONGE 4X4 12PLY STRL (GAUZE/BANDAGES/DRESSINGS) ×3 IMPLANT
GLOVE BIO SURGEON STRL SZ 6.5 (GLOVE) ×4 IMPLANT
GLOVE BIO SURGEON STRL SZ7 (GLOVE) ×3 IMPLANT
GLOVE BIO SURGEONS STRL SZ 6.5 (GLOVE) ×2
GOWN STRL REUS W/ TWL LRG LVL3 (GOWN DISPOSABLE) ×2 IMPLANT
GOWN STRL REUS W/ TWL XL LVL3 (GOWN DISPOSABLE) ×1 IMPLANT
GOWN STRL REUS W/TWL LRG LVL3 (GOWN DISPOSABLE) ×4
GOWN STRL REUS W/TWL XL LVL3 (GOWN DISPOSABLE) ×2
IV LACTATED RINGERS 1000ML (IV SOLUTION) ×6 IMPLANT
LINER CANISTER 1000CC FLEX (MISCELLANEOUS) ×6 IMPLANT
NDL SAFETY ECLIPSE 18X1.5 (NEEDLE) ×1 IMPLANT
NEEDLE HYPO 18GX1.5 SHARP (NEEDLE) ×2
NEEDLE HYPO 25X1 1.5 SAFETY (NEEDLE) ×3 IMPLANT
PACK BASIN DAY SURGERY FS (CUSTOM PROCEDURE TRAY) ×3 IMPLANT
PAD ALCOHOL SWAB (MISCELLANEOUS) ×3 IMPLANT
PENCIL BUTTON HOLSTER BLD 10FT (ELECTRODE) IMPLANT
SLEEVE SCD COMPRESS KNEE MED (MISCELLANEOUS) ×3 IMPLANT
SPONGE LAP 18X18 RF (DISPOSABLE) ×3 IMPLANT
SUT MNCRL AB 4-0 PS2 18 (SUTURE) IMPLANT
SUT MON AB 5-0 PS2 18 (SUTURE) ×6 IMPLANT
SYR 10ML LL (SYRINGE) ×12 IMPLANT
SYR 3ML 18GX1 1/2 (SYRINGE) IMPLANT
SYR 50ML LL SCALE MARK (SYRINGE) ×6 IMPLANT
SYR CONTROL 10ML LL (SYRINGE) ×3 IMPLANT
SYR TOOMEY 50ML (SYRINGE) IMPLANT
TOWEL GREEN STERILE FF (TOWEL DISPOSABLE) ×6 IMPLANT
TUBING INFILTRATION IT-10001 (TUBING) ×6 IMPLANT
TUBING SET GRADUATE ASPIR 12FT (MISCELLANEOUS) ×3 IMPLANT
UNDERPAD 30X30 (UNDERPADS AND DIAPERS) ×6 IMPLANT

## 2019-03-06 NOTE — Op Note (Signed)
DATE OF OPERATION: 03/06/2019  LOCATION: [facility]  PREOPERATIVE DIAGNOSIS: breast asymmetry after reconstruction for breast cancer  POSTOPERATIVE DIAGNOSIS: Same  PROCEDURE: lipofilling to bilateral breasts for symmetry after breast cancer treatment  SURGEON: Lyndee Leo Sanger Johnisha Louks, DO  ASSISTANT: [place], RNFA  EBL: none  CONDITION: Stable  COMPLICATIONS: None  INDICATION: The patient, Cassandra Tran, is a 48 y.o. female born on 1970-12-12, is here for treatment of breast asymmetry.  She underwent bilateral mastectomies and was reconstructed with expanders.  The expanders were then exchanged with implant placement.  She has some breast asymmetry and doing well.  PROCEDURE DETAILS:  The patient was seen prior to surgery and marked.  The IV antibiotics were given. The patient was taken to the operating room and given a general anesthetic. A standard time out was performed and all information was confirmed by those in the room. SCDs were placed.   The breast and abdomen were prepped and draped.  Local was placed at the umbilical area for intraoperative hemostasis and post operative pain control.   A 5 mm incision was made in the inferior periumbilical area.  Tumescent was used to infuse in the abdominal adipose layer for a Superwet technique.  After waiting for tumescent to take effect, liposuction was performed.  That adipose was collected in the revolve system.  Directions were followed for preparation of the adipose tissue with proper washing, spanning and draining.  The fat was then collected and 10 cc syringes.  A #15 blade was used to make an incision at the medial aspect of both breasts at the mastectomy scar.  The prepared adipose was then injected into the breast pocket area of both breasts for a total of 90 cc on each side there appeared to be very good symmetry.  The incisions were closed with a 5-0 Monocryl.  Dermabond was placed at the breast incisions.  ABDs  were applied with a breast binder and an abdominal binder. The patient was allowed to wake up and taken to recovery room in stable condition at the end of the case. The family was notified at the end of the case.   The RNFA assisted throughout the case.  The RNFA was essential in retraction and counter traction when needed to make the case progress smoothly.  This retraction and assistance made it possible to see the tissue plans for the procedure.  The assistance was needed for blood control, tissue re-approximation and assisted with closure of the incision site.

## 2019-03-06 NOTE — Anesthesia Preprocedure Evaluation (Addendum)
Anesthesia Evaluation  Patient identified by MRN, date of birth, ID band Patient awake    Reviewed: Allergy & Precautions, NPO status , Patient's Chart, lab work & pertinent test results  Airway Mallampati: II  TM Distance: >3 FB Neck ROM: Full    Dental  (+) Teeth Intact, Dental Advisory Given   Pulmonary Current Smoker,    breath sounds clear to auscultation       Cardiovascular hypertension, Pt. on medications  Rhythm:Regular Rate:Normal     Neuro/Psych negative psych ROS   GI/Hepatic Neg liver ROS, GERD  ,  Endo/Other  negative endocrine ROS  Renal/GU negative Renal ROS     Musculoskeletal negative musculoskeletal ROS (+)   Abdominal Normal abdominal exam  (+)   Peds  Hematology negative hematology ROS (+)   Anesthesia Other Findings   Reproductive/Obstetrics                            Anesthesia Physical Anesthesia Plan  ASA: II  Anesthesia Plan: General   Post-op Pain Management:    Induction: Intravenous  PONV Risk Score and Plan: 3 and Ondansetron, Dexamethasone and Midazolam  Airway Management Planned: Oral ETT and LMA  Additional Equipment: None  Intra-op Plan:   Post-operative Plan: Extubation in OR  Informed Consent: I have reviewed the patients History and Physical, chart, labs and discussed the procedure including the risks, benefits and alternatives for the proposed anesthesia with the patient or authorized representative who has indicated his/her understanding and acceptance.     Dental advisory given  Plan Discussed with: CRNA  Anesthesia Plan Comments:        Anesthesia Quick Evaluation

## 2019-03-06 NOTE — Anesthesia Procedure Notes (Signed)
Procedure Name: LMA Insertion Date/Time: 03/06/2019 2:48 PM Performed by: Maryella Shivers, CRNA Pre-anesthesia Checklist: Patient identified, Emergency Drugs available, Suction available and Patient being monitored Patient Re-evaluated:Patient Re-evaluated prior to induction Oxygen Delivery Method: Circle system utilized Preoxygenation: Pre-oxygenation with 100% oxygen Induction Type: IV induction Ventilation: Mask ventilation without difficulty LMA: LMA inserted LMA Size: 4.0 Number of attempts: 1 Airway Equipment and Method: Bite block Placement Confirmation: positive ETCO2 Tube secured with: Tape Dental Injury: Teeth and Oropharynx as per pre-operative assessment

## 2019-03-06 NOTE — Discharge Instructions (Signed)
Post Anesthesia Home Care Instructions  Activity: Get plenty of rest for the remainder of the day. A responsible individual must stay with you for 24 hours following the procedure.  For the next 24 hours, DO NOT: -Drive a car -Paediatric nurse -Drink alcoholic beverages -Take any medication unless instructed by your physician -Make any legal decisions or sign important papers.  Meals: Start with liquid foods such as gelatin or soup. Progress to regular foods as tolerated. Avoid greasy, spicy, heavy foods. If nausea and/or vomiting occur, drink only clear liquids until the nausea and/or vomiting subsides. Call your physician if vomiting continues.  Special Instructions/Symptoms: Your throat may feel dry or sore from the anesthesia or the breathing tube placed in your throat during surgery. If this causes discomfort, gargle with warm salt water. The discomfort should disappear within 24 hours.  If you had a scopolamine patch placed behind your ear for the management of post- operative nausea and/or vomiting:  1. The medication in the patch is effective for 72 hours, after which it should be removed.  Wrap patch in a tissue and discard in the trash. Wash hands thoroughly with soap and water. 2. You may remove the patch earlier than 72 hours if you experience unpleasant side effects which may include dry mouth, dizziness or visual disturbances. 3. Avoid touching the patch. Wash your hands with soap and water after contact with the patch.      INSTRUCTIONS FOR AFTER SURGERY   You are having surgery.  You will likely have some questions about what to expect following your operation.  The following information will help you and your family understand what to expect when you are discharged from the hospital.  Following these guidelines will help ensure a smooth recovery and reduce risks of complications.   Postoperative instructions include information on: diet, wound care, medications and  physical activity.  AFTER SURGERY Expect to go home after the procedure.  In some cases, you may need to spend one night in the hospital for observation.  DIET This surgery does not require a specific diet.  However, I have to mention that the healthier you eat the better your body can start healing. It is important to increasing your protein intake.  This means limiting the foods with sugar and carbohydrates.  Focus on vegetables and some meat.  If you have any liposuction during your procedure be sure to drink water.  If your urine is bright yellow, then it is concentrated, and you need to drink more water.  As a general rule after surgery, you should have 8 ounces of water every hour while awake.  If you find you are persistently nauseated or unable to take in liquids let us know.  NO TOBACCO USE or EXPOSURE.  This will slow your healing process and increase the risk of a wound.  WOUND CARE You can shower the day after surgery if you don't have a drain.  Use fragrance free soap.  Dial, [place] and [place] are usually mild on the skin. If you have a drain clean with baby wipes until the drain is removed.  If you have steri-strips / tape directly attached to your skin leave them in place. It is OK to get these wet.  No baths, pools or hot tubs for two weeks. We close your incision to leave the smallest and best-looking scar. No ointment or creams on your incisions until given the go ahead.  Especially not Neosporin (Too many skin reactions with this  one).  A few weeks after surgery you can use Mederma and start massaging the scar. We ask you to wear your binder or sports bra for the first 6 weeks around the clock, including while sleeping. This provides added comfort and helps reduce the fluid accumulation at the surgery site.  ACTIVITY No heavy lifting until cleared by the doctor.  It is OK to walk and climb stairs. In fact, moving your legs is very important to decrease your risk of a blood clot.  It  will also help keep you from getting deconditioned.  Every 1 to 2 hours get up and walk for 5 minutes. This will help with a quicker recovery back to normal.  Let pain be your guide so you don't do too much.  NO, you cannot do the spring cleaning and don't plan on taking care of anyone else.  This is your time for TLC.  You will be more comfortable if you sleep and rest with your head elevated either with a few pillows under you or in a recliner.  No stomach sleeping for a few months.  WORK Everyone returns to work at different times. As a rough guide, most people take at least 1 - 2 weeks off prior to returning to work. If you need documentation for your job, bring the forms to your postoperative follow up visit.  DRIVING Arrange for someone to bring you home from the hospital.  You may be able to drive a few days after surgery but not while taking any narcotics or valium.  BOWEL MOVEMENTS Constipation can occur after anesthesia and while taking pain medication.  It is important to stay ahead for your comfort.  We recommend taking Milk of Magnesia (2 tablespoons; twice a day) while taking the pain pills.  SEROMA This is fluid your body tried to put in the surgical site.  This is normal but if it creates tight skinny skin let us know.  It usually decreases in a few weeks.  WHEN TO CALL Call your surgeon's office if any of the following occur:  Fever 101 degrees F or greater  Excessive bleeding or fluid from the incision site.  Pain that increases over time without aid from the medications  Redness, warmth, or pus draining from incision sites  Persistent nausea or inability to take in liquids  Severe misshapen area that underwent the operation.

## 2019-03-06 NOTE — Interval H&P Note (Signed)
History and Physical Interval Note:  03/06/2019 2:14 PM  Kendra Harrington  has presented today for surgery, with the diagnosis of HISTORY OF BREAST CANCER.  The various methods of treatment have been discussed with the patient and family. After consideration of risks, benefits and other options for treatment, the patient has consented to  Procedure(s) with comments: Fat grafting to bilateral breast (Bilateral) - 90 min, please as a surgical intervention.  The patient's history has been reviewed, patient examined, no change in status, stable for surgery.  I have reviewed the patient's chart and labs.  Questions were answered to the patient's satisfaction.     [name]

## 2019-03-06 NOTE — Transfer of Care (Signed)
Immediate Anesthesia Transfer of Care Note  Patient: Kendra Harrington  Procedure(s) Performed: Fat grafting to bilateral breast from abdomen (Bilateral Breast)  Patient Location: PACU  Anesthesia Type:General  Level of Consciousness: awake, alert  and oriented  Airway & Oxygen Therapy: Patient Spontanous Breathing and Patient connected to nasal cannula oxygen  Post-op Assessment: Report given to RN and Post -op Vital signs reviewed and stable  Post vital signs: Reviewed and stable  Last Vitals:  Vitals Value Taken Time  BP    Temp    Pulse    Resp    SpO2      Last Pain:  Vitals:   03/06/19 1118  TempSrc: Oral  PainSc: 0-No pain         Complications: No apparent anesthesia complications

## 2019-03-08 ENCOUNTER — Encounter (HOSPITAL_BASED_OUTPATIENT_CLINIC_OR_DEPARTMENT_OTHER): Payer: Self-pay | Admitting: Plastic Surgery

## 2019-03-08 NOTE — Anesthesia Postprocedure Evaluation (Signed)
Anesthesia Post Note  Patient: Kendra Harrington  Procedure(s) Performed: Fat grafting to bilateral breast from abdomen (Bilateral Breast)     Patient location during evaluation: PACU Anesthesia Type: General Level of consciousness: sedated and patient cooperative Pain management: pain level controlled Vital Signs Assessment: post-procedure vital signs reviewed and stable Respiratory status: spontaneous breathing Cardiovascular status: stable Anesthetic complications: no    Last Vitals:  Vitals:   03/06/19 1645 03/06/19 1725  BP: 135/87 132/87  Pulse: 73 63  Resp: 13 16  Temp:  36.5 C  SpO2: 100% 97%    Last Pain:  Vitals:   03/07/19 0956  TempSrc:   PainSc: [place]

## 2019-03-14 ENCOUNTER — Telehealth: Payer: Self-pay | Admitting: Plastic Surgery

## 2019-03-14 NOTE — Telephone Encounter (Signed)

## 2019-03-15 ENCOUNTER — Encounter: Payer: Self-pay | Admitting: Plastic Surgery

## 2019-03-15 ENCOUNTER — Ambulatory Visit (INDEPENDENT_AMBULATORY_CARE_PROVIDER_SITE_OTHER): Payer: BC Managed Care – PPO | Admitting: Plastic Surgery

## 2019-03-15 ENCOUNTER — Other Ambulatory Visit: Payer: Self-pay

## 2019-03-15 VITALS — BP 145/98 | HR 61 | Temp 98.3°F | Ht 64.0 in | Wt 159.4 lb

## 2019-03-15 DIAGNOSIS — N651 Disproportion of reconstructed breast: Secondary | ICD-10-CM

## 2019-03-15 DIAGNOSIS — Z9013 Acquired absence of bilateral breasts and nipples: Secondary | ICD-10-CM

## 2019-03-15 NOTE — Progress Notes (Signed)
The patient is a 48 year old female here for follow-up after undergoing liposculpture doing with hypo-filling of the breast.  She has bruising of her abdomen but as expected.  So far her breasts look very good.  Pictures were taken and placed in the chart with the patient's permission.  All incisions are healing well.  There is no redness and no sign of infection.  The next step will be nipple areole a tattoo which will most likely be in August.  Call with any questions or concerns.

## 2019-03-26 ENCOUNTER — Telehealth: Payer: Self-pay | Admitting: Plastic Surgery

## 2019-03-26 NOTE — Telephone Encounter (Signed)
Patient called wanting to know if she is able to have a bubble bath now. Her surgery was on 03/06/2019. Contact: [phone].

## 2019-03-27 NOTE — Telephone Encounter (Addendum)
Called and spoke with the patient on (03/26/19) regarding the message below.  She stated that she was told that she's to wear the binder for her stomach for 6 weeks, and it has only been 3 weeks.  She also stated that her (R) breast is tight, and she wanted to know if that was normal.  Informed the patient that I will speak with Dr. Marla Roe regarding her questions, and give her a call back.  Called the patient back and informed her that I spoke with Dr. Marla Roe and she said that yes it is normal for the (R) breast to be tight.  Massaging the breast lightly should help.  And yes she can have a bubble bath.  After the bath place the binder back on her stomach.  Patient verbalized understanding and agreed.//AB/CMA

## 2019-04-09 ENCOUNTER — Ambulatory Visit: Payer: BLUE CROSS/BLUE SHIELD | Admitting: Plastic Surgery

## 2019-05-02 ENCOUNTER — Encounter: Payer: Self-pay | Admitting: Plastic Surgery

## 2019-05-02 ENCOUNTER — Other Ambulatory Visit: Payer: Self-pay

## 2019-05-02 ENCOUNTER — Ambulatory Visit (INDEPENDENT_AMBULATORY_CARE_PROVIDER_SITE_OTHER): Payer: BC Managed Care – PPO | Admitting: Plastic Surgery

## 2019-05-02 VITALS — BP 146/98 | HR 65 | Temp 97.1°F | Ht 64.0 in | Wt 163.6 lb

## 2019-05-02 DIAGNOSIS — Z9889 Other specified postprocedural states: Secondary | ICD-10-CM

## 2019-05-02 NOTE — Progress Notes (Signed)
The patient is a 48 year old female here for follow-up after undergoing secondary breast reconstruction.  Overall she is doing well.  There is no sign of infection.  I do not see any fat necrosis or palpate any lumps or bumps.  Her incisions are well-healed.  She states she sometimes has to use the abdominal binder but otherwise is doing well.  She thinks that they are a little bit firm.  The left is a little firmer than the right.  She may be a candidate for a little bit of liposuction on the lateral aspect of both breasts.  The right is fuller than the left but not bad and more fat grafting.  I do think we need to wait and let things settle down.  I like to see her back in 3 months.

## 2019-05-13 ENCOUNTER — Other Ambulatory Visit: Payer: Self-pay | Admitting: Family Medicine

## 2019-05-23 ENCOUNTER — Encounter: Payer: Self-pay | Admitting: Family Medicine

## 2019-05-28 ENCOUNTER — Encounter: Payer: BLUE CROSS/BLUE SHIELD | Admitting: Family Medicine

## 2019-06-18 ENCOUNTER — Ambulatory Visit: Payer: BLUE CROSS/BLUE SHIELD | Admitting: Oncology

## 2019-08-02 ENCOUNTER — Other Ambulatory Visit: Payer: Self-pay

## 2019-08-02 ENCOUNTER — Encounter: Payer: Self-pay | Admitting: Plastic Surgery

## 2019-08-02 ENCOUNTER — Ambulatory Visit (INDEPENDENT_AMBULATORY_CARE_PROVIDER_SITE_OTHER): Payer: BC Managed Care – PPO | Admitting: Plastic Surgery

## 2019-08-02 VITALS — BP 151/95 | HR 67 | Temp 97.6°F | Ht 65.0 in | Wt 166.4 lb

## 2019-08-02 DIAGNOSIS — C50411 Malignant neoplasm of upper-outer quadrant of right female breast: Secondary | ICD-10-CM | POA: Diagnosis not present

## 2019-08-02 DIAGNOSIS — Z9889 Other specified postprocedural states: Secondary | ICD-10-CM | POA: Diagnosis not present

## 2019-08-02 DIAGNOSIS — Z170 Estrogen receptor positive status [ER+]: Secondary | ICD-10-CM | POA: Diagnosis not present

## 2019-08-02 NOTE — Progress Notes (Signed)
   Subjective:    Patient ID: Kendra Harrington, female    DOB: Apr 01, 1971, 48 y.o.   MRN: [id number]  The patient is a 48 yrs old female here with her husband for follow up on her breast surgery.  She has bilateral silicone  implants in place.  Overall she is doing well.  She would like them to be bigger but understands the limitations.  She is ready for the nipple areola tattoo placement.  She has not been massaging the breasts but will start.    Review of Systems  Constitutional: Negative.  Negative for activity change and appetite change.  HENT: Negative.   Eyes: Negative.   Respiratory: Negative.   Cardiovascular: Negative.   Gastrointestinal: Negative.   Endocrine: Negative.   Genitourinary: Negative.   Musculoskeletal: Negative.   Psychiatric/Behavioral: Negative.        Objective:   Physical Exam Constitutional:      Appearance: Normal appearance.  HENT:     Head: Normocephalic and atraumatic.  Cardiovascular:     Rate and Rhythm: Normal rate.     Pulses: Normal pulses.  Pulmonary:     Effort: Pulmonary effort is normal. No respiratory distress.  Abdominal:     General: Abdomen is flat.  Neurological:     General: No focal deficit present.     Mental Status: She is alert and oriented to person, place, and time.  Psychiatric:        Mood and Affect: Mood normal.        Behavior: Behavior normal.        Assessment & Plan:     ICD-10-CM   1. Malignant neoplasm of upper-outer quadrant of right breast in female, estrogen receptor positive ([place])  C50.411    Z17.0   2. S/P breast reconstruction, bilateral  [id number]     Pictures were obtained of the patient and placed in the chart with the patient's or guardian's permission.  Plan for NAC tattoo placement.  Massage to the breasts in the down and inner position.  Can start with mederma as well.

## 2019-10-21 ENCOUNTER — Encounter: Payer: Self-pay | Admitting: Family Medicine

## 2019-10-21 ENCOUNTER — Ambulatory Visit (INDEPENDENT_AMBULATORY_CARE_PROVIDER_SITE_OTHER): Payer: BC Managed Care – PPO | Admitting: Family Medicine

## 2019-10-21 ENCOUNTER — Other Ambulatory Visit: Payer: Self-pay

## 2019-10-21 VITALS — BP 131/86 | HR 63 | Temp 98.4°F | Ht 64.1 in | Wt 163.0 lb

## 2019-10-21 DIAGNOSIS — Z0000 Encounter for general adult medical examination without abnormal findings: Secondary | ICD-10-CM | POA: Diagnosis not present

## 2019-10-21 DIAGNOSIS — M549 Dorsalgia, unspecified: Secondary | ICD-10-CM | POA: Diagnosis not present

## 2019-10-21 DIAGNOSIS — Z170 Estrogen receptor positive status [ER+]: Secondary | ICD-10-CM | POA: Diagnosis not present

## 2019-10-21 DIAGNOSIS — Z23 Encounter for immunization: Secondary | ICD-10-CM | POA: Diagnosis not present

## 2019-10-21 DIAGNOSIS — I10 Essential (primary) hypertension: Secondary | ICD-10-CM | POA: Diagnosis not present

## 2019-10-21 DIAGNOSIS — C50411 Malignant neoplasm of upper-outer quadrant of right female breast: Secondary | ICD-10-CM

## 2019-10-21 LAB — UA/M W/RFLX CULTURE, ROUTINE
Bilirubin, UA: NEGATIVE
Glucose, UA: NEGATIVE
Ketones, UA: NEGATIVE
Leukocytes,UA: NEGATIVE
Nitrite, UA: NEGATIVE
Protein,UA: NEGATIVE
RBC, UA: NEGATIVE
Specific Gravity, UA: 1.025 (ref 1.005–1.030)
Urobilinogen, Ur: 0.2 mg/dL (ref 0.2–1.0)
pH, UA: 5 (ref 5.0–7.5)

## 2019-10-21 MED ORDER — AMLODIPINE BESYLATE 5 MG PO TABS: 5.0000 mg | ORAL_TABLET | Freq: Every day | ORAL | 1 refills | Status: DC

## 2019-10-21 MED ORDER — BUPROPION HCL ER (XL) 150 MG PO TB24: 150.0000 mg | ORAL_TABLET | Freq: Every day | ORAL | 0 refills | Status: DC

## 2019-10-21 NOTE — Patient Instructions (Addendum)
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
## 2019-10-22 ENCOUNTER — Encounter: Payer: Self-pay | Admitting: Family Medicine

## 2019-10-22 LAB — COMPREHENSIVE METABOLIC PANEL
ALT: 19 IU/L (ref 0–32)
AST: 24 IU/L (ref 0–40)
Albumin/Globulin Ratio: 1.6 (ref 1.2–2.2)
Albumin: 4.8 g/dL (ref 3.8–4.8)
Alkaline Phosphatase: 85 IU/L (ref 39–117)
BUN/Creatinine Ratio: 14 (ref 9–23)
BUN: 14 mg/dL (ref 6–24)
Bilirubin Total: 0.2 mg/dL (ref 0.0–1.2)
CO2: 26 mmol/L (ref 20–29)
Calcium: 9.6 mg/dL (ref 8.7–10.2)
Chloride: 104 mmol/L (ref 96–106)
Creatinine, Ser: 0.98 mg/dL (ref 0.57–1.00)
GFR calc Af Amer: 78 mL/min/{1.73_m2} (ref >59)
GFR calc non Af Amer: 68 mL/min/{1.73_m2} (ref >59)
Globulin, Total: 3 g/dL (ref 1.5–4.5)
Glucose: 86 mg/dL (ref 65–99)
Potassium: 4.6 mmol/L (ref 3.5–5.2)
Sodium: 142 mmol/L (ref 134–144)
Total Protein: 7.8 g/dL (ref 6.0–8.5)

## 2019-10-22 LAB — LIPID PANEL W/O CHOL/HDL RATIO
Cholesterol, Total: 129 mg/dL (ref 100–199)
HDL: 55 mg/dL (ref >39)
LDL Chol Calc (NIH): 53 mg/dL (ref 0–99)
Triglycerides: 120 mg/dL (ref 0–149)
VLDL Cholesterol Cal: 21 mg/dL (ref 5–40)

## 2019-10-22 LAB — CBC WITH DIFFERENTIAL/PLATELET
Basophils Absolute: 0 10*3/uL (ref 0.0–0.2)
Basos: 1 %
EOS (ABSOLUTE): 0.1 10*3/uL (ref 0.0–0.4)
Eos: 3 %
Hematocrit: 40.3 % (ref 34.0–46.6)
Hemoglobin: 12.8 g/dL (ref 11.1–15.9)
Immature Grans (Abs): 0 10*3/uL (ref 0.0–0.1)
Immature Granulocytes: 0 %
Lymphocytes Absolute: 1.4 10*3/uL (ref 0.7–3.1)
Lymphs: 32 %
MCH: 27.2 pg (ref 26.6–33.0)
MCHC: 31.8 g/dL (ref 31.5–35.7)
MCV: 86 fL (ref 79–97)
Monocytes Absolute: 0.5 10*3/uL (ref 0.1–0.9)
Monocytes: 11 %
Neutrophils Absolute: 2.3 10*3/uL (ref 1.4–7.0)
Neutrophils: 53 %
Platelets: 246 10*3/uL (ref 150–450)
RBC: 4.7 x10E6/uL (ref 3.77–5.28)
RDW: 13.9 % (ref 11.7–15.4)
WBC: 4.3 10*3/uL (ref 3.4–10.8)

## 2019-10-22 LAB — TSH: TSH: 0.747 u[IU]/mL (ref 0.450–4.500)

## 2019-10-22 NOTE — Assessment & Plan Note (Signed)
Following with Oncology and Plastic Surgery, s/p double mastectomy with b/l reconstructive surgery. Continue per their recommendations

## 2019-10-22 NOTE — Assessment & Plan Note (Signed)
BPs stable and WNL, continue current regimen 

## 2019-11-12 ENCOUNTER — Other Ambulatory Visit: Payer: Self-pay | Admitting: Family Medicine

## 2019-11-12 NOTE — Telephone Encounter (Signed)
Patient last seen 10/21/19 and 30 day supply sent in that day. Rax should not be due. Plus patient has an upcoming appointment 11/18/19.

## 2019-11-12 NOTE — Telephone Encounter (Signed)
Requested medication (s) are due for refill today: no  Requested medication (s) are on the active medication list: yes  Last refill:  10/21/2019 Future visit scheduled: yes  Notes to clinic: Patient has appointment on 11/18/2019   Requested Prescriptions  Pending Prescriptions Disp Refills   buPROPion (WELLBUTRIN XL) 150 MG 24 hr tablet [Pharmacy Med Name: BUPROPION HCL XL 150 MG TABLET] 30 tablet 0    Sig: TAKE 1 [place]      Psychiatry: Antidepressants - bupropion Failed - 11/12/2019  1:26 AM      Failed - Valid encounter within last 6 months    Recent Outpatient Visits           3 weeks ago Essential hypertension   [facility] Volney American, [place]   11 months ago Seasonal allergic rhinitis due to pollen   [facility], [place], DO   1 year ago Essential hypertension   [place], Rachel Elizabeth, [place]   1 year ago Ductal carcinoma in situ (DCIS) of left breast   [facility] Kathrine Haddock, NP   2 years ago Essential hypertension   [facility] Volney American, [place]       Future Appointments             In 6 days Volney American, [place], [facility]            Passed - Last BP in normal range    BP Readings from Last 1 Encounters:  10/21/19 131/86

## 2019-11-18 ENCOUNTER — Ambulatory Visit (INDEPENDENT_AMBULATORY_CARE_PROVIDER_SITE_OTHER): Payer: BC Managed Care – PPO | Admitting: Family Medicine

## 2019-11-18 ENCOUNTER — Other Ambulatory Visit: Payer: Self-pay

## 2019-11-18 ENCOUNTER — Encounter: Payer: Self-pay | Admitting: Family Medicine

## 2019-11-18 ENCOUNTER — Ambulatory Visit: Payer: Self-pay | Admitting: Family Medicine

## 2019-11-18 VITALS — BP 128/89 | HR 73 | Temp 98.4°F | Ht 64.0 in | Wt 165.0 lb

## 2019-11-18 DIAGNOSIS — E663 Overweight: Secondary | ICD-10-CM | POA: Diagnosis not present

## 2019-11-18 MED ORDER — BUPROPION HCL ER (XL) 300 MG PO TB24: 300.0000 mg | ORAL_TABLET | Freq: Every day | ORAL | 0 refills | Status: DC

## 2019-11-18 NOTE — Progress Notes (Signed)
BP 128/89   Pulse 73   Temp 98.4 F (36.9 C) (Oral)   Ht 5\' 4"  (1.626 m)   Wt 165 lb (74.8 kg)   SpO2 97%   BMI 28.32 kg/m    Subjective:    Patient ID: Cassandra Tran, female    DOB: Nov 06, 1970, 49 y.o.   MRN: [id number]  HPI: Cassandra Tran is a 49 y.o. female  Chief Complaint  Patient presents with  . Weight Check   Patient presenting today for 1 month f/u weight check after starting low dose wellbutrin. States she tolerated the medicine well but did not notice a benefit on it as far as weight loss and weight stayed steady/went up 2 lb. Still trying to watch diet and stay active. Continues to be very interested in losing some weight.   Relevant past medical, surgical, family and social history reviewed and updated as indicated. Interim medical history since our last visit reviewed. Allergies and medications reviewed and updated.  Review of Systems  Per HPI unless specifically indicated above     Objective:    BP 128/89   Pulse 73   Temp 98.4 F (36.9 C) (Oral)   Ht 5\' 4"  (1.626 m)   Wt 165 lb (74.8 kg)   SpO2 97%   BMI 28.32 kg/m   Wt Readings from Last 3 Encounters:  11/18/19 165 lb (74.8 kg)  10/21/19 163 lb (73.9 kg)  08/02/19 166 lb 6.4 oz (75.5 kg)    Physical Exam Vitals and nursing note reviewed.  Constitutional:      Appearance: Normal appearance. She is not ill-appearing.  HENT:     Head: Atraumatic.  Eyes:     Extraocular Movements: Extraocular movements intact.     Conjunctiva/sclera: Conjunctivae normal.  Cardiovascular:     Rate and Rhythm: Normal rate and regular rhythm.     Heart sounds: Normal heart sounds.  Pulmonary:     Effort: Pulmonary effort is normal.     Breath sounds: Normal breath sounds.  Musculoskeletal:        General: Normal range of motion.     Cervical back: Normal range of motion and neck supple.  Skin:    General: Skin is warm and dry.  Neurological:     Mental Status: She is alert and oriented to person, place,  and time.  Psychiatric:        Mood and Affect: Mood normal.        Thought Content: Thought content normal.        Judgment: Judgment normal.     Results for orders placed or performed in visit on 10/21/19  CBC with Differential/Platelet  Result Value Ref Range   WBC 4.3 3.4 - 10.8 x10E3/uL   RBC 4.70 3.77 - 5.28 x10E6/uL   Hemoglobin 12.8 11.1 - 15.9 g/dL   Hematocrit 40.3 34.0 - 46.6 %   MCV 86 79 - 97 fL   MCH 27.2 26.6 - 33.0 pg   MCHC 31.8 31.5 - 35.7 g/dL   RDW 13.9 11.7 - 15.4 %   Platelets 246 150 - 450 x10E3/uL   Neutrophils 53 Not Estab. %   Lymphs 32 Not Estab. %   Monocytes 11 Not Estab. %   Eos 3 Not Estab. %   Basos 1 Not Estab. %   Neutrophils Absolute 2.3 1.4 - 7.0 x10E3/uL   Lymphocytes Absolute 1.4 0.7 - 3.1 x10E3/uL   Monocytes Absolute 0.5 0.1 - 0.9 x10E3/uL   EOS (ABSOLUTE)  0.1 0.0 - 0.4 x10E3/uL   Basophils Absolute 0.0 0.0 - 0.2 x10E3/uL   Immature Granulocytes 0 Not Estab. %   Immature Grans (Abs) 0.0 0.0 - 0.1 x10E3/uL  Comprehensive metabolic panel  Result Value Ref Range   Glucose 86 65 - 99 mg/dL   BUN 14 6 - 24 mg/dL   Creatinine, Ser 0.98 0.57 - 1.00 mg/dL   GFR calc non Af Amer 68 >59 mL/min/1.73   GFR calc Af Amer 78 >59 mL/min/1.73   BUN/Creatinine Ratio 14 9 - 23   Sodium 142 134 - 144 mmol/L   Potassium 4.6 3.5 - 5.2 mmol/L   Chloride 104 96 - 106 mmol/L   CO2 26 20 - 29 mmol/L   Calcium 9.6 8.7 - 10.2 mg/dL   Total Protein 7.8 6.0 - 8.5 g/dL   Albumin 4.8 3.8 - 4.8 g/dL   Globulin, Total 3.0 1.5 - 4.5 g/dL   Albumin/Globulin Ratio 1.6 1.2 - 2.2   Bilirubin Total 0.2 0.0 - 1.2 mg/dL   Alkaline Phosphatase 85 39 - 117 IU/L   AST 24 0 - 40 IU/L   ALT 19 0 - 32 IU/L  Lipid Panel w/o Chol/HDL Ratio  Result Value Ref Range   Cholesterol, Total 129 100 - 199 mg/dL   Triglycerides 120 0 - 149 mg/dL   HDL 55 >39 mg/dL   VLDL Cholesterol Cal 21 5 - 40 mg/dL   LDL Chol Calc (NIH) 53 0 - 99 mg/dL  TSH  Result Value Ref Range   TSH  0.747 0.450 - 4.500 uIU/mL  UA/M w/rflx Culture, Routine   Specimen: Urine   URINE  Result Value Ref Range   Specific Gravity, UA 1.025 1.005 - 1.030   pH, UA 5.0 5.0 - 7.5   Color, UA Yellow Yellow   Appearance Ur Clear Clear   Leukocytes,UA Negative Negative   Protein,UA Negative Negative/Trace   Glucose, UA Negative Negative   Ketones, UA Negative Negative   RBC, UA Negative Negative   Bilirubin, UA Negative Negative   Urobilinogen, Ur 0.2 0.2 - 1.0 mg/dL   Nitrite, UA Negative Negative      Assessment & Plan:   Problem List Items Addressed This Visit    None    Visit Diagnoses    Overweight (BMI 25.0-29.9)    -  Primary   Options reviewed, pt opting to increase wellbutrin dose and see if this will help with her weight loss goals further. Continue good diet and exercise       Follow up plan: Return in about 4 weeks (around 12/16/2019) for Weight f/u.

## 2019-12-10 ENCOUNTER — Other Ambulatory Visit: Payer: Self-pay | Admitting: Family Medicine

## 2019-12-10 NOTE — Telephone Encounter (Signed)
Requested Prescriptions  Pending Prescriptions Disp Refills  . buPROPion (WELLBUTRIN XL) 300 MG 24 hr tablet [Pharmacy Med Name: BUPROPION HCL XL 300 MG TABLET] 30 tablet 0    Sig: TAKE 1 TABLET BY MOUTH EVERY DAY     Psychiatry: Antidepressants - bupropion Passed - 12/10/2019  1:33 AM      Passed - Last BP in normal range    BP Readings from Last 1 Encounters:  11/18/19 128/89         Passed - Valid encounter within last 6 months    Recent Outpatient Visits          3 weeks ago Overweight (BMI 25.0-29.9)   [place], [place], [place]   1 month ago Essential hypertension   [place], [place], [place]   12 months ago Seasonal allergic rhinitis due to pollen   [facility], [place], [place]   1 year ago Essential hypertension   [facility] Volney American, [place]   2 years ago Ductal carcinoma in situ (DCIS) of left breast   [facility] Kathrine Haddock, NP      Future Appointments            In 6 days O[facility] Desanctis, Lilia Argue, [place], [facility]

## 2019-12-16 ENCOUNTER — Ambulatory Visit: Payer: BC Managed Care – PPO | Admitting: Family Medicine

## 2019-12-27 ENCOUNTER — Ambulatory Visit: Payer: BC Managed Care – PPO | Attending: Internal Medicine

## 2019-12-27 DIAGNOSIS — Z23 Encounter for immunization: Secondary | ICD-10-CM

## 2019-12-27 NOTE — Progress Notes (Signed)
   Covid-19 Vaccination Clinic  Name:  Cassandra Tran    MRN: [id number] DOB: 10-15-1970  12/27/2019  [name] was observed post Covid-19 immunization for 15 minutes without incident. She was provided with Vaccine Information Sheet and instruction to access the V-Safe system.   [name] was instructed to call 911 with any severe reactions post vaccine: Marland Kitchen Difficulty breathing  . Swelling of face and throat  . A fast heartbeat  . A bad rash all over body  . Dizziness and weakness   Immunizations Administered    Name Date Dose VIS Date Route   Pfizer COVID-19 Vaccine 12/27/2019  8:59 AM 0.3 mL 09/13/2019 Intramuscular   Manufacturer: [employer]   Lot: [id number]   [place]: [id number]

## 2019-12-30 ENCOUNTER — Encounter: Payer: Self-pay | Admitting: Family Medicine

## 2019-12-30 ENCOUNTER — Ambulatory Visit (INDEPENDENT_AMBULATORY_CARE_PROVIDER_SITE_OTHER): Payer: BC Managed Care – PPO | Admitting: Family Medicine

## 2019-12-30 VITALS — BP 100/98 | Wt 153.0 lb

## 2019-12-30 DIAGNOSIS — N979 Female infertility, unspecified: Secondary | ICD-10-CM | POA: Diagnosis not present

## 2019-12-30 DIAGNOSIS — E663 Overweight: Secondary | ICD-10-CM

## 2019-12-30 MED ORDER — BUPROPION HCL ER (XL) 300 MG PO TB24: 300.0000 mg | ORAL_TABLET | Freq: Every day | ORAL | 1 refills | Status: DC

## 2019-12-30 NOTE — Progress Notes (Signed)
BP (!) 100/98   Wt 153 lb (69.4 kg)   BMI 26.26 kg/m    Subjective:    Patient ID: Cassandra Tran, female    DOB: [birth year], 49 y.o.   MRN: [id number]  HPI: Cassandra Tran is a 49 y.o. female  Chief Complaint  Patient presents with  . Weight Check    . This visit was completed via telephone due to the restrictions of the COVID-19 pandemic. All issues as above were discussed and addressed. Physical exam was done as above through visual confirmation on telephone. If it was felt that the patient should be evaluated in the office, they were directed there. The patient verbally consented to this visit. . Location of the patient: home . Location of the provider: work . Those involved with this call:  . Provider: Merrie Roof, PA-C . CMA: Lesle Chris, [place] . Front Desk/Registration: Jill Side  . Time spent on call: 20 minutes on the phone discussing health concerns. 5 minutes total spent in review of patient's record and preparation of their chart. I verified patient identity using two factors (patient name and date of birth). Patient consents verbally to being seen via telemedicine visit today.   Presenting today for weight follow up on wellbutrin. Has lost nearly 10-15 lb now and feeling great with the medication. Feels it helps with appetite as well as motivation to be active. Denies side effects, concerns.   Wanting to discuss chances of fertility as she would like to have another child. Notes last period was 2 years ago. Of note, currently on tamoxifen for breast cancer through Oncology.   Relevant past medical, surgical, family and social history reviewed and updated as indicated. Interim medical history since our last visit reviewed. Allergies and medications reviewed and updated.  Review of Systems  Per HPI unless specifically indicated above     Objective:    BP (!) 100/98   Wt 153 lb (69.4 kg)   BMI 26.26 kg/m   Wt Readings from Last 3 Encounters:  12/30/19 153 lb  (69.4 kg)  11/18/19 165 lb (74.8 kg)  10/21/19 163 lb (73.9 kg)    Physical Exam  Unable to perform PE due to patient lack of access to video technology for today's visit  Results for orders placed or performed in visit on 10/21/19  CBC with Differential/Platelet  Result Value Ref Range   WBC 4.3 3.4 - 10.8 x10E3/uL   RBC 4.70 3.77 - 5.28 x10E6/uL   Hemoglobin 12.8 11.1 - 15.9 g/dL   Hematocrit 40.3 34.0 - 46.6 %   MCV 86 79 - 97 fL   MCH 27.2 26.6 - 33.0 pg   MCHC 31.8 31.5 - 35.7 g/dL   RDW 13.9 11.7 - 15.4 %   Platelets 246 150 - 450 x10E3/uL   Neutrophils 53 Not Estab. %   Lymphs 32 Not Estab. %   Monocytes 11 Not Estab. %   Eos 3 Not Estab. %   Basos 1 Not Estab. %   Neutrophils Absolute 2.3 1.4 - 7.0 x10E3/uL   Lymphocytes Absolute 1.4 0.7 - 3.1 x10E3/uL   Monocytes Absolute 0.5 0.1 - 0.9 x10E3/uL   EOS (ABSOLUTE) 0.1 0.0 - 0.4 x10E3/uL   Basophils Absolute 0.0 0.0 - 0.2 x10E3/uL   Immature Granulocytes 0 Not Estab. %   Immature Grans (Abs) 0.0 0.0 - 0.1 x10E3/uL  Comprehensive metabolic panel  Result Value Ref Range   Glucose 86 65 - 99 mg/dL   BUN 14 6 -  24 mg/dL   Creatinine, Ser 0.98 0.57 - 1.00 mg/dL   GFR calc non Af Amer 68 >59 mL/min/1.73   GFR calc Af Amer 78 >59 mL/min/1.73   BUN/Creatinine Ratio 14 9 - 23   Sodium 142 134 - 144 mmol/L   Potassium 4.6 3.5 - 5.2 mmol/L   Chloride 104 96 - 106 mmol/L   CO2 26 20 - 29 mmol/L   Calcium 9.6 8.7 - 10.2 mg/dL   Total Protein 7.8 6.0 - 8.5 g/dL   Albumin 4.8 3.8 - 4.8 g/dL   Globulin, Total 3.0 1.5 - 4.5 g/dL   Albumin/Globulin Ratio 1.6 1.2 - 2.2   Bilirubin Total 0.2 0.0 - 1.2 mg/dL   Alkaline Phosphatase 85 39 - 117 IU/L   AST 24 0 - 40 IU/L   ALT 19 0 - 32 IU/L  Lipid Panel w/o Chol/HDL Ratio  Result Value Ref Range   Cholesterol, Total 129 100 - 199 mg/dL   Triglycerides 120 0 - 149 mg/dL   HDL 55 >39 mg/dL   VLDL Cholesterol Cal 21 5 - 40 mg/dL   LDL Chol Calc (NIH) 53 0 - 99 mg/dL  TSH   Result Value Ref Range   TSH 0.747 0.450 - 4.500 uIU/mL  UA/M w/rflx Culture, Routine   Specimen: Urine   URINE  Result Value Ref Range   Specific Gravity, UA 1.025 1.005 - 1.030   pH, UA 5.0 5.0 - 7.5   Color, UA Yellow Yellow   Appearance Ur Clear Clear   Leukocytes,UA Negative Negative   Protein,UA Negative Negative/Trace   Glucose, UA Negative Negative   Ketones, UA Negative Negative   RBC, UA Negative Negative   Bilirubin, UA Negative Negative   Urobilinogen, Ur 0.2 0.2 - 1.0 mg/dL   Nitrite, UA Negative Negative      Assessment & Plan:   Problem List Items Addressed This Visit      Other   Overweight (BMI 25.0-29.9) - Primary    Excellent improvement with addition of wellbutrin and continued diet and exercise changes. Continue current regimen       Other Visit Diagnoses    Female fertility problem       Interested in exploring her fertility options, discussed low likelihood given post menopausal and on tamoxifen currenty but referral to Reproductive Endo placed   Relevant Orders   Ambulatory referral to Endocrinology       Follow up plan: Return in about 3 months (around 03/31/2020) for weight , bp f/u.

## 2019-12-31 ENCOUNTER — Encounter: Payer: Self-pay | Admitting: Family Medicine

## 2019-12-31 ENCOUNTER — Telehealth: Payer: Self-pay | Admitting: Family Medicine

## 2019-12-31 DIAGNOSIS — E663 Overweight: Secondary | ICD-10-CM | POA: Insufficient documentation

## 2019-12-31 NOTE — Assessment & Plan Note (Signed)
Excellent improvement with addition of wellbutrin and continued diet and exercise changes. Continue current regimen

## 2019-12-31 NOTE — Telephone Encounter (Signed)
-----   Message from Volney American, [place] sent at 12/31/2019  4:44 AM EDT ----- 3 month weight, bp f/u

## 2019-12-31 NOTE — Telephone Encounter (Signed)
lvm ,sent letter to make this 3 month f/u/

## 2020-01-06 NOTE — Telephone Encounter (Signed)
Pt did not want to schedule apt this far out.

## 2020-01-14 ENCOUNTER — Other Ambulatory Visit: Payer: Self-pay | Admitting: Family Medicine

## 2020-01-14 NOTE — Telephone Encounter (Signed)
Pt called in to request a refill for buPROPion (WELLBUTRIN XL) 300 MG 24 hr tablet    Phone: [id number] - [place], [place]  [address], [place] [address]

## 2020-01-14 NOTE — Telephone Encounter (Signed)
Requested medication (s) are due for refill today: No - early  Requested medication (s) are on the active medication list: Yes  Last refill:  12/30/19  Future visit scheduled: No  Notes to clinic:  Early request.    Requested Prescriptions  Pending Prescriptions Disp Refills   buPROPion (WELLBUTRIN XL) 300 MG 24 hr tablet 90 tablet 1    Sig: Take 1 tablet (300 mg total) by mouth daily.      Psychiatry: Antidepressants - bupropion Failed - 01/14/2020  1:48 PM      Failed - Last BP in normal range    BP Readings from Last 1 Encounters:  12/30/19 (!) 100/98          Passed - Valid encounter within last 6 months    Recent Outpatient Visits           2 weeks ago Overweight (BMI 25.0-29.9)   [place], [place], [place]   1 month ago Overweight (BMI 25.0-29.9)   [place], [place], [place]   2 months ago Essential hypertension   [place], [place], [place]   1 year ago Seasonal allergic rhinitis due to pollen   [facility], [place], [place]   1 year ago Essential hypertension   [place], [place], [place]

## 2020-01-21 ENCOUNTER — Ambulatory Visit: Payer: BC Managed Care – PPO | Attending: Internal Medicine

## 2020-01-21 DIAGNOSIS — Z23 Encounter for immunization: Secondary | ICD-10-CM

## 2020-01-21 NOTE — Progress Notes (Signed)
   Covid-19 Vaccination Clinic  Name:  Cassandra Tran    MRN: [id number] DOB: 1971/07/06  01/21/2020  Ms. Braman was observed post Covid-19 immunization for 15 minutes without incident. She was provided with Vaccine Information Sheet and instruction to access the V-Safe system.   Ms. Velasques was instructed to call 911 with any severe reactions post vaccine: Marland Kitchen Difficulty breathing  . Swelling of face and throat  . A fast heartbeat  . A bad rash all over body  . Dizziness and weakness   Immunizations Administered    Name Date Dose VIS Date Route   Pfizer COVID-19 Vaccine 01/21/2020  8:57 AM 0.3 mL 11/27/2018 Intramuscular   Manufacturer: [employer]   Lot: [id number]   [place]: [id number]

## 2020-02-24 ENCOUNTER — Other Ambulatory Visit: Payer: Self-pay | Admitting: Oncology

## 2020-02-26 ENCOUNTER — Other Ambulatory Visit: Payer: Self-pay

## 2020-02-26 ENCOUNTER — Emergency Department: Payer: BC Managed Care – PPO

## 2020-02-26 ENCOUNTER — Other Ambulatory Visit: Payer: Self-pay | Admitting: Family Medicine

## 2020-02-26 ENCOUNTER — Emergency Department
Admission: EM | Admit: 2020-02-26 | Discharge: 2020-02-26 | Disposition: A | Discharge status: Home / Self Care | Payer: BC Managed Care – PPO | Attending: Emergency Medicine | Admitting: Emergency Medicine

## 2020-02-26 ENCOUNTER — Encounter: Payer: Self-pay | Admitting: Emergency Medicine

## 2020-02-26 DIAGNOSIS — Y92009 Unspecified place in unspecified non-institutional (private) residence as the place of occurrence of the external cause: Secondary | ICD-10-CM | POA: Diagnosis not present

## 2020-02-26 DIAGNOSIS — Z79899 Other long term (current) drug therapy: Secondary | ICD-10-CM | POA: Insufficient documentation

## 2020-02-26 DIAGNOSIS — I10 Essential (primary) hypertension: Secondary | ICD-10-CM | POA: Insufficient documentation

## 2020-02-26 DIAGNOSIS — Y999 Unspecified external cause status: Secondary | ICD-10-CM | POA: Insufficient documentation

## 2020-02-26 DIAGNOSIS — Y939 Activity, unspecified: Secondary | ICD-10-CM | POA: Insufficient documentation

## 2020-02-26 DIAGNOSIS — S6991XA Unspecified injury of right wrist, hand and finger(s), initial encounter: Secondary | ICD-10-CM | POA: Diagnosis present

## 2020-02-26 DIAGNOSIS — Z91010 Allergy to peanuts: Secondary | ICD-10-CM | POA: Diagnosis not present

## 2020-02-26 DIAGNOSIS — S62521A Displaced fracture of distal phalanx of right thumb, initial encounter for closed fracture: Secondary | ICD-10-CM | POA: Diagnosis not present

## 2020-02-26 MED ORDER — AMLODIPINE BESYLATE 5 MG PO TABS: 5.0000 mg | ORAL_TABLET | Freq: Every day | ORAL | 0 refills | Status: DC

## 2020-02-26 MED ORDER — TRAMADOL HCL 50 MG PO TABS: 50.0000 mg | ORAL_TABLET | Freq: Four times a day (QID) | ORAL | 0 refills | Status: DC | PRN
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
## 2020-02-26 NOTE — Telephone Encounter (Signed)
Medication Refill - Medication:  amLODipine (NORVASC) 5 MG tablet   Has the patient contacted their pharmacy? Yes advised to call office. Pt is completely out for 2 days.   Preferred Pharmacy (with phone number or street name):  [id number] [place], [place] Phone:  [phone]  Fax:  [phone]       Agent: Please be advised that RX refills may take up to 3 business days. We ask that you follow-up with your pharmacy.

## 2020-03-04 ENCOUNTER — Other Ambulatory Visit: Payer: Self-pay

## 2020-03-04 ENCOUNTER — Encounter: Payer: Self-pay | Admitting: Family Medicine

## 2020-03-04 ENCOUNTER — Ambulatory Visit (INDEPENDENT_AMBULATORY_CARE_PROVIDER_SITE_OTHER): Payer: BC Managed Care – PPO | Admitting: Family Medicine

## 2020-03-04 VITALS — BP 155/97 | HR 75 | Temp 99.4°F | Wt 154.0 lb

## 2020-03-04 DIAGNOSIS — T1490XA Injury, unspecified, initial encounter: Secondary | ICD-10-CM | POA: Diagnosis not present

## 2020-03-04 DIAGNOSIS — S62501P Fracture of unspecified phalanx of right thumb, subsequent encounter for fracture with malunion: Secondary | ICD-10-CM

## 2020-03-04 MED ORDER — BUPROPION HCL ER (XL) 300 MG PO TB24: 300.0000 mg | ORAL_TABLET | Freq: Every day | ORAL | 1 refills | Status: DC

## 2020-03-04 MED ORDER — OXYCODONE-ACETAMINOPHEN 10-325 MG PO TABS: 1.0000 | ORAL_TABLET | Freq: Four times a day (QID) | ORAL | 0 refills | Status: AC | PRN
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
## 2020-03-12 ENCOUNTER — Telehealth: Payer: Self-pay | Admitting: Family Medicine

## 2020-03-12 NOTE — Telephone Encounter (Signed)
We do not fill out those forms, these can be done through Psychiatry. It is my understanding that she has now moved out of state back to [place] but if this has changed I am happy to refer her somewhere

## 2020-03-12 NOTE — Telephone Encounter (Signed)
Copied from [place] [phone]. Topic: General - Other >> Mar 12, 2020  3:57 PM Wynetta Emery, [place] C wrote: Reason for CRM: pt was told that she need a letter from her provider in regards to her needing a service dog.   Pt would like a call back.

## 2020-03-13 NOTE — Telephone Encounter (Signed)
Patient returned call and I explained what Apolonio Schneiders advised.  Asked patient has she returned back to town or was she in [place]?  Patient asked "who would she refer me too?" and I stated to patient Psychiatry.  I tried to ask if she had moved back again and if she wanted the referral but patient hung up on me.   Routing to provider as Juluis Rainier.

## 2020-03-13 NOTE — Telephone Encounter (Signed)
Tried calling patient. No answer. LVM for patient to return phone call.

## 2020-03-16 ENCOUNTER — Other Ambulatory Visit: Payer: Self-pay | Admitting: Oncology

## 2020-05-15 ENCOUNTER — Other Ambulatory Visit: Payer: Self-pay

## 2020-05-15 MED ORDER — LIDOCAINE-PRILOCAINE 2.5-2.5 % EX CREA: 1.0000 "application " | TOPICAL_CREAM | CUTANEOUS | 0 refills | Status: DC | PRN

## 2020-05-15 NOTE — Progress Notes (Unsigned)
RX sent to pt's pharmacy for EMLA cream With instructions to use

## 2020-05-21 ENCOUNTER — Other Ambulatory Visit: Payer: Self-pay | Admitting: Family Medicine

## 2020-07-02 ENCOUNTER — Other Ambulatory Visit: Payer: Self-pay | Admitting: Family Medicine

## 2020-07-20 ENCOUNTER — Other Ambulatory Visit: Payer: Self-pay

## 2020-07-20 ENCOUNTER — Ambulatory Visit (INDEPENDENT_AMBULATORY_CARE_PROVIDER_SITE_OTHER): Payer: 59

## 2020-07-20 VITALS — BP 132/82 | HR 78 | Temp 97.9°F | Ht 64.0 in | Wt 146.0 lb

## 2020-07-20 DIAGNOSIS — Z9013 Acquired absence of bilateral breasts and nipples: Secondary | ICD-10-CM | POA: Diagnosis not present

## 2020-07-20 DIAGNOSIS — Z719 Counseling, unspecified: Secondary | ICD-10-CM

## 2020-07-20 NOTE — Progress Notes (Signed)
NIPPLE AREOLAR TATTOO PROCEDURE  PREOPERATIVE DIAGNOSIS:  Acquired absence of (BILATERAL) nipple areolar   POSTOPERATIVE DIAGNOSIS: Acquired absence of (BILATERAL) nipple areolar    PROCEDURES: (BILATERAL )nipple areolar tattoo   ATTENDING SURGEON: Dr. Floreen Comber   ANESTHESIA:  EMLA- pt applied to areas 30 mins prior to procedure  COMPLICATIONS: None.  JUSTIFICATION FOR PROCEDURE:  Ms. Snoke is a 49 y.o. female with a history of breast cancer status post bilateral breast reconstruction. The patient presents for nipple areolar complex tattoo. Risks, benefits, indications, and alternatives of the above described procedures were discussed with the patient and all the patient's questions were answered.   DESCRIPTION OF PROCEDURE: After informed consent was obtained and proper identification of patient and surgical site was made, the patient was taken to the procedure room and pre-procedure photos were obtained & entered into chart.  A time out was performed to confirm patient's identity and surgical site.  Placement / size /  colors  of the nipple/areola complex were decided by pt. Pt was then placed supine on the procedure table. The patient was prepped and draped in the usual sterile fashion.  Using a # 7 & # 9  tattoo head on the [employer] tattoo machine pigment was instilled to the designed nipple areolar complex.   Once adequate pigment had been applied to the nipple areolar complex, a post procedure photo was taken & entered into chart. vaseline/ xeroform & 4x4 gauze dressing was applied.  The patient tolerated the procedure well.  Post-procedure instructions reviewed  [employer] used: Josph Macho Skin-lot# [id number] / exp. 05/18/2023 Emilia Beck lot# [id number] / exp. 12/24/2022 Yetta Glassman [id number] / exp. 04/13/2023 [facility] lot# [id number] / exp. 10/08/2022 Darcella Gasman lot# [id number] / exp. 09/11/2022 [employer] lot#  [id number] / exp. 09/11/2022 Duration lot# [id number] / exp. 12/2021

## 2020-07-20 NOTE — Patient Instructions (Signed)
Post procedure care reviewed Pt will use vaseline/xeroform & gauze dressing to cover areas while healing. She is reminded that the sites may be red/swollen & irritated for 1-2 weeks. She will avoid scrubbing or harsh cleansers. She understands that the color will fade between 30- 40% requiring touch-up in the future. She will call for any concerns or questions.

## 2020-08-13 ENCOUNTER — Other Ambulatory Visit (HOSPITAL_COMMUNITY)
Admission: RE | Admit: 2020-08-13 | Discharge: 2020-08-13 | Disposition: A | Discharge status: Home / Self Care | Payer: 59 | Source: Ambulatory Visit | Attending: Nurse Practitioner | Admitting: Nurse Practitioner

## 2020-08-13 ENCOUNTER — Ambulatory Visit: Payer: 59 | Admitting: Nurse Practitioner

## 2020-08-13 ENCOUNTER — Other Ambulatory Visit: Payer: Self-pay

## 2020-08-13 ENCOUNTER — Encounter: Payer: Self-pay | Admitting: Nurse Practitioner

## 2020-08-13 VITALS — BP 132/80 | HR 67 | Temp 97.7°F | Wt 154.0 lb

## 2020-08-13 DIAGNOSIS — Z114 Encounter for screening for human immunodeficiency virus [HIV]: Secondary | ICD-10-CM

## 2020-08-13 DIAGNOSIS — Z1159 Encounter for screening for other viral diseases: Secondary | ICD-10-CM

## 2020-08-13 DIAGNOSIS — Z23 Encounter for immunization: Secondary | ICD-10-CM | POA: Diagnosis not present

## 2020-08-13 DIAGNOSIS — E663 Overweight: Secondary | ICD-10-CM

## 2020-08-13 DIAGNOSIS — Z124 Encounter for screening for malignant neoplasm of cervix: Secondary | ICD-10-CM | POA: Diagnosis not present

## 2020-08-13 DIAGNOSIS — I10 Essential (primary) hypertension: Secondary | ICD-10-CM

## 2020-08-13 DIAGNOSIS — Z1211 Encounter for screening for malignant neoplasm of colon: Secondary | ICD-10-CM

## 2020-08-13 MED ORDER — BUPROPION HCL ER (XL) 300 MG PO TB24
300.0000 mg | ORAL_TABLET | Freq: Every day | ORAL | 4 refills | Status: DC
Start: 2020-08-13 — End: 2021-04-21

## 2020-08-13 MED ORDER — AMLODIPINE BESYLATE 5 MG PO TABS
5.0000 mg | ORAL_TABLET | Freq: Every day | ORAL | 4 refills | Status: DC
Start: 2020-08-13 — End: 2020-09-21

## 2020-08-13 NOTE — Assessment & Plan Note (Signed)
Chronic, stable with BP at goal.  Continue current medication regimen and adjust as needed.  Refills sent in.  BMP and TSH today.  Return in 6 months.

## 2020-08-13 NOTE — Progress Notes (Signed)
BP 132/80   Pulse 67   Temp 97.7 F (36.5 C)   Wt 154 lb (69.9 kg)   SpO2 99%   BMI 26.43 kg/m    Subjective:    Patient ID: Cassandra Tran, female    DOB: [birth year], 49 y.o.   MRN: [id number]  HPI: Cassandra Tran is a 49 y.o. female  Chief Complaint  Patient presents with  . colonoscopy questions    Pt has questions about colonoscopy screening   . papsmear    pt has questions about getting a pap done    Presents today to inquire about colonoscopy screening and pap testing.  Last pap 03/13/2017.  Has not had colonoscopy.  Discussed at length preventative medicine and goals.  Would benefit from both colonoscopy and qualifies per guideline recommendations.  Would also benefit from repeat pap at this time.  HYPERTENSION Continues on Amlodipine 5 MG.  Takes Wellbutrin for weight loss. Hypertension status: stable  Satisfied with current treatment? yes Duration of hypertension: chronic BP monitoring frequency:  not checking BP range:  BP medication side effects:  no Medication compliance: good compliance Aspirin: no Recurrent headaches: no Visual changes: no Palpitations: no Dyspnea: no Chest pain: no Lower extremity edema: no Dizzy/lightheaded: no  Relevant past medical, surgical, family and social history reviewed and updated as indicated. Interim medical history since our last visit reviewed. Allergies and medications reviewed and updated.  Review of Systems  Constitutional: Negative for activity change, appetite change, diaphoresis, fatigue and fever.  Respiratory: Negative for cough, chest tightness and shortness of breath.   Cardiovascular: Negative for chest pain, palpitations and leg swelling.  Gastrointestinal: Negative.   Neurological: Negative.   Psychiatric/Behavioral: Negative.     Per HPI unless specifically indicated above     Objective:    BP 132/80   Pulse 67   Temp 97.7 F (36.5 C)   Wt 154 lb (69.9 kg)   SpO2 99%   BMI 26.43 kg/m   Wt  Readings from Last 3 Encounters:  08/13/20 154 lb (69.9 kg)  07/20/20 146 lb (66.2 kg)  03/04/20 154 lb (69.9 kg)    Physical Exam Vitals and nursing note reviewed.  Constitutional:      General: She is awake.     Appearance: She is well-developed.  HENT:     Head: Normocephalic.     Right Ear: Hearing normal.     Left Ear: Hearing normal.     Nose: Nose normal.     Mouth/Throat:     Mouth: Mucous membranes are moist.  Eyes:     General: Lids are normal.        Right eye: No discharge.        Left eye: No discharge.     Conjunctiva/sclera: Conjunctivae normal.     Pupils: Pupils are equal, round, and reactive to light.  Neck:     Thyroid: No thyromegaly.     Vascular: No carotid bruit or JVD.  Cardiovascular:     Rate and Rhythm: Normal rate and regular rhythm.     Heart sounds: Normal heart sounds. No murmur heard.  No gallop.   Pulmonary:     Effort: Pulmonary effort is normal.     Breath sounds: Normal breath sounds.  Abdominal:     General: Bowel sounds are normal.     Palpations: Abdomen is soft. There is no hepatomegaly or splenomegaly.     Hernia: There is no hernia in the left inguinal area or  right inguinal area.  Genitourinary:    Exam position: Lithotomy position.     Pubic Area: No rash.      Labia:        Right: No rash.        Left: No rash.      Vagina: Normal.     Cervix: Normal.     Uterus: Normal.      Adnexa: Right adnexa normal and left adnexa normal.     Rectum: Normal.     Comments: Pap performed, cervix anterior and viewed.  Chaperone declined. Musculoskeletal:     Cervical back: Normal range of motion and neck supple.     Right lower leg: No edema.     Left lower leg: No edema.  Lymphadenopathy:     Cervical: No cervical adenopathy.  Skin:    General: Skin is warm and dry.  Neurological:     Mental Status: She is alert and oriented to person, place, and time.  Psychiatric:        Attention and Perception: Attention normal.         Mood and Affect: Mood normal.        Behavior: Behavior normal. Behavior is cooperative.        Thought Content: Thought content normal.        Judgment: Judgment normal.    Results for orders placed or performed in visit on 10/21/19  CBC with Differential/Platelet  Result Value Ref Range   WBC 4.3 3.4 - 10.8 x10E3/uL   RBC 4.70 3.77 - 5.28 x10E6/uL   Hemoglobin 12.8 11.1 - 15.9 g/dL   Hematocrit 40.3 34.0 - 46.6 %   MCV 86 79 - 97 fL   MCH 27.2 26.6 - 33.0 pg   MCHC 31.8 31 - 35 g/dL   RDW 13.9 11.7 - 15.4 %   Platelets 246 150 - 450 x10E3/uL   Neutrophils 53 Not Estab. %   Lymphs 32 Not Estab. %   Monocytes 11 Not Estab. %   Eos 3 Not Estab. %   Basos 1 Not Estab. %   Neutrophils Absolute 2.3 1.40 - 7.00 x10E3/uL   Lymphocytes Absolute 1.4 0 - 3 x10E3/uL   Monocytes Absolute 0.5 0 - 0 x10E3/uL   EOS (ABSOLUTE) 0.1 0.0 - 0.4 x10E3/uL   Basophils Absolute 0.0 0 - 0 x10E3/uL   Immature Granulocytes 0 Not Estab. %   Immature Grans (Abs) 0.0 0.0 - 0.1 x10E3/uL  Comprehensive metabolic panel  Result Value Ref Range   Glucose 86 65 - 99 mg/dL   BUN 14 6 - 24 mg/dL   Creatinine, Ser 0.98 0.57 - 1.00 mg/dL   GFR calc non Af Amer 68 >59 mL/min/1.73   GFR calc Af Amer 78 >59 mL/min/1.73   BUN/Creatinine Ratio 14 9 - 23   Sodium 142 134 - 144 mmol/L   Potassium 4.6 3.5 - 5.2 mmol/L   Chloride 104 96 - 106 mmol/L   CO2 26 20 - 29 mmol/L   Calcium 9.6 8.7 - 10.2 mg/dL   Total Protein 7.8 6.0 - 8.5 g/dL   Albumin 4.8 3.8 - 4.8 g/dL   Globulin, Total 3.0 1.5 - 4.5 g/dL   Albumin/Globulin Ratio 1.6 1.2 - 2.2   Bilirubin Total 0.2 0.0 - 1.2 mg/dL   Alkaline Phosphatase 85 39 - 117 IU/L   AST 24 0 - 40 IU/L   ALT 19 0 - 32 IU/L  Lipid Panel w/o Chol/HDL Ratio  Result  Value Ref Range   Cholesterol, Total 129 100 - 199 mg/dL   Triglycerides 120 0 - 149 mg/dL   HDL 55 >39 mg/dL   VLDL Cholesterol Cal 21 5 - 40 mg/dL   LDL Chol Calc (NIH) 53 0 - 99 mg/dL  TSH  Result Value Ref  Range   TSH 0.747 0.450 - 4.500 uIU/mL  UA/M w/rflx Culture, Routine   Specimen: Urine   URINE  Result Value Ref Range   Specific Gravity, UA 1.025 1.005 - 1.030   pH, UA 5.0 5.0 - 7.5   Color, UA Yellow Yellow   Appearance Ur Clear Clear   Leukocytes,UA Negative Negative   Protein,UA Negative Negative/Trace   Glucose, UA Negative Negative   Ketones, UA Negative Negative   RBC, UA Negative Negative   Bilirubin, UA Negative Negative   Urobilinogen, Ur 0.2 0.2 - 1.0 mg/dL   Nitrite, UA Negative Negative      Assessment & Plan:   Problem List Items Addressed This Visit      Cardiovascular and Mediastinum   Essential hypertension - Primary    Chronic, stable with BP at goal.  Continue current medication regimen and adjust as needed.  Refills sent in.  BMP and TSH today.  Return in 6 months.      Relevant Medications   amLODipine (NORVASC) 5 MG tablet   Other Relevant Orders   Basic metabolic panel   TSH   Lipid Panel w/o Chol/HDL Ratio     Other   Overweight (BMI 25.0-29.9)    BMI 26.43.  Continue Wellbutrin.  Recommended eating smaller high protein, low fat meals more frequently and exercising 30 mins a day 5 times a week with a goal of 10-15lb weight loss in the next 3 months. Patient voiced their understanding and motivation to adhere to these recommendations.        Other Visit Diagnoses    Cervical cancer screening       Pap obtained and sent today   Relevant Orders   Cytology - PAP   Colon cancer screening       GI referral in place   Relevant Orders   Ambulatory referral to Gastroenterology   Need for hepatitis C screening test       Hep C screening on labs today   Relevant Orders   Hepatitis C antibody   Encounter for screening for HIV       HIV screening on labs today   Relevant Orders   HIV Antibody (routine testing w rflx)   Need for influenza vaccination       Flu vaccine today   Relevant Orders   Flu Vaccine QUAD 6+ mos PF IM (Fluarix Quad PF)        Follow up plan: Return in about 6 months (around 02/10/2021) for HTN -- meet new PCP.

## 2020-08-13 NOTE — Assessment & Plan Note (Signed)
BMI 26.43.  Continue Wellbutrin.  Recommended eating smaller high protein, low fat meals more frequently and exercising 30 mins a day 5 times a week with a goal of 10-15lb weight loss in the next 3 months. Patient voiced their understanding and motivation to adhere to these recommendations.

## 2020-08-13 NOTE — Patient Instructions (Signed)

## 2020-08-14 LAB — BASIC METABOLIC PANEL
BUN/Creatinine Ratio: 12 (ref 9–23)
BUN: 12 mg/dL (ref 6–24)
CO2: 24 mmol/L (ref 20–29)
Calcium: 9.3 mg/dL (ref 8.7–10.2)
Chloride: 106 mmol/L (ref 96–106)
Creatinine, Ser: 1.04 mg/dL — ABNORMAL HIGH (ref 0.57–1.00)
GFR calc Af Amer: 72 mL/min/{1.73_m2} (ref >59)
GFR calc non Af Amer: 63 mL/min/{1.73_m2} (ref >59)
Glucose: 97 mg/dL (ref 65–99)
Potassium: 4.6 mmol/L (ref 3.5–5.2)
Sodium: 142 mmol/L (ref 134–144)

## 2020-08-14 LAB — TSH: TSH: 0.839 u[IU]/mL (ref 0.450–4.500)

## 2020-08-14 LAB — LIPID PANEL W/O CHOL/HDL RATIO
Cholesterol, Total: 137 mg/dL (ref 100–199)
HDL: 58 mg/dL (ref >39)
LDL Chol Calc (NIH): 67 mg/dL (ref 0–99)
Triglycerides: 55 mg/dL (ref 0–149)
VLDL Cholesterol Cal: 12 mg/dL (ref 5–40)

## 2020-08-14 LAB — HIV ANTIBODY (ROUTINE TESTING W REFLEX): HIV Screen 4th Generation wRfx: NONREACTIVE

## 2020-08-14 LAB — HEPATITIS C ANTIBODY: Hep C Virus Ab: 0.2 s/co ratio (ref 0.0–0.9)

## 2020-08-14 NOTE — Progress Notes (Signed)
Good morning, please let Kendra Harrington know her blood work has returned and overall looks normal.  No concerns on these.  Great job!! Keep being awesome!!  Thank you for allowing me to participate in your care. Kindest regards, Swayzee Wadley

## 2020-08-18 ENCOUNTER — Telehealth: Payer: Self-pay | Admitting: Family Medicine

## 2020-08-18 NOTE — Telephone Encounter (Signed)
Placed cologuard order for this patient.  I will sign when ready

## 2020-08-18 NOTE — Addendum Note (Signed)
Addended by: Marnee Guarneri T on: 08/18/2020 05:16 PM   Modules accepted: Orders

## 2020-08-18 NOTE — Telephone Encounter (Signed)
Patient returned call and states that she has already been read her lab results. I read note by Marnee Guarneri NP written 08/18/20. She verbalized understanding.  Patient is requesting to have Cologuard test.  She is requesting that this replace the colonoscopy that was discussed. Please advice patient when ordered.

## 2020-08-18 NOTE — Progress Notes (Signed)
Please let [place] know her pap returned negative for cancerous cells and negative HPV.  Repeat in 5 years.  Have a great day!!

## 2020-08-19 NOTE — Telephone Encounter (Signed)
Order form signed and faxed in to [employer].

## 2020-08-19 NOTE — Telephone Encounter (Signed)
Order form filled out and placed in providers folder for signature. Will fax after she signs.

## 2020-08-26 ENCOUNTER — Telehealth: Payer: 59

## 2020-08-31 ENCOUNTER — Other Ambulatory Visit: Payer: Self-pay

## 2020-08-31 ENCOUNTER — Ambulatory Visit (INDEPENDENT_AMBULATORY_CARE_PROVIDER_SITE_OTHER): Payer: 59

## 2020-08-31 VITALS — BP 118/80 | HR 79 | Temp 98.1°F | Ht 64.0 in | Wt 154.0 lb

## 2020-08-31 DIAGNOSIS — Z9013 Acquired absence of bilateral breasts and nipples: Secondary | ICD-10-CM

## 2020-08-31 DIAGNOSIS — Z719 Counseling, unspecified: Secondary | ICD-10-CM

## 2020-09-02 LAB — CYTOLOGY - PAP
Comment: NEGATIVE
Diagnosis: NEGATIVE
High risk HPV: NEGATIVE

## 2020-09-15 ENCOUNTER — Ambulatory Visit: Payer: 59 | Admitting: Plastic Surgery

## 2020-09-21 ENCOUNTER — Encounter: Payer: Self-pay | Admitting: Nurse Practitioner

## 2020-09-21 ENCOUNTER — Emergency Department
Admission: EM | Admit: 2020-09-21 | Discharge: 2020-09-21 | Disposition: A | Discharge status: Home / Self Care | Payer: 59 | Attending: Emergency Medicine | Admitting: Emergency Medicine

## 2020-09-21 ENCOUNTER — Other Ambulatory Visit: Payer: Self-pay

## 2020-09-21 ENCOUNTER — Encounter: Payer: Self-pay | Admitting: Emergency Medicine

## 2020-09-21 ENCOUNTER — Ambulatory Visit (INDEPENDENT_AMBULATORY_CARE_PROVIDER_SITE_OTHER): Payer: 59 | Admitting: Nurse Practitioner

## 2020-09-21 DIAGNOSIS — R42 Dizziness and giddiness: Secondary | ICD-10-CM | POA: Insufficient documentation

## 2020-09-21 DIAGNOSIS — R030 Elevated blood-pressure reading, without diagnosis of hypertension: Secondary | ICD-10-CM | POA: Diagnosis present

## 2020-09-21 DIAGNOSIS — Z5321 Procedure and treatment not carried out due to patient leaving prior to being seen by health care provider: Secondary | ICD-10-CM | POA: Diagnosis not present

## 2020-09-21 DIAGNOSIS — I10 Essential (primary) hypertension: Secondary | ICD-10-CM | POA: Diagnosis not present

## 2020-09-21 DIAGNOSIS — R202 Paresthesia of skin: Secondary | ICD-10-CM | POA: Diagnosis not present

## 2020-09-21 LAB — BASIC METABOLIC PANEL
Anion gap: 8 (ref 5–15)
BUN: 19 mg/dL (ref 6–20)
CO2: 25 mmol/L (ref 22–32)
Calcium: 9.1 mg/dL (ref 8.9–10.3)
Chloride: 105 mmol/L (ref 98–111)
Creatinine, Ser: 1.13 mg/dL — ABNORMAL HIGH (ref 0.44–1.00)
GFR, Estimated: 59 mL/min — ABNORMAL LOW (ref >60)
Glucose, Bld: 104 mg/dL — ABNORMAL HIGH (ref 70–99)
Potassium: 4.1 mmol/L (ref 3.5–5.1)
Sodium: 138 mmol/L (ref 135–145)

## 2020-09-21 LAB — CBC
HCT: 37.9 % (ref 36.0–46.0)
Hemoglobin: 12.2 g/dL (ref 12.0–15.0)
MCH: 28.2 pg (ref 26.0–34.0)
MCHC: 32.2 g/dL (ref 30.0–36.0)
MCV: 87.7 fL (ref 80.0–100.0)
Platelets: 238 10*3/uL (ref 150–400)
RBC: 4.32 MIL/uL (ref 3.87–5.11)
RDW: 13.8 % (ref 11.5–15.5)
WBC: 5.2 10*3/uL (ref 4.0–10.5)
nRBC: 0 % (ref 0.0–0.2)

## 2020-09-21 MED ORDER — AMLODIPINE BESYLATE 5 MG PO TABS: 5.0000 mg | ORAL_TABLET | Freq: Two times a day (BID) | ORAL | 4 refills | Status: DC

## 2020-09-21 NOTE — ED Triage Notes (Signed)
Patient states that she has a history of high blood pressure and takes medication for it. Patient states that her times three days her blood pressure has been high. Patient states that yesterday 164/101 yesterday. Patient states that she was seen by a paramedic and her bp was fine. Patient states that she also started feeling dizzy yesterday.

## 2020-09-21 NOTE — Assessment & Plan Note (Signed)
X 2 episodes and no further, suspect a mix of elevation in BP and anxiety.  Will increase Amlodipine to 5 MG BID, which has shown improvement on BP today.  No red flag symptoms and normal neuro exam.  Labs this morning in ER reviewed.  Plan for return in 6 weeks, sooner if worsening or return of symptoms.

## 2020-09-21 NOTE — Patient Instructions (Signed)
DASH Eating Plan DASH stands for "Dietary Approaches to Stop Hypertension." The DASH eating plan is a healthy eating plan that has been shown to reduce high blood pressure (hypertension). It may also reduce your risk for type 2 diabetes, heart disease, and stroke. The DASH eating plan may also help with weight loss. What are tips for following this plan?  General guidelines  Avoid eating more than 2,300 mg (milligrams) of salt (sodium) a day. If you have hypertension, you may need to reduce your sodium intake to 1,500 mg a day.  Limit alcohol intake to no more than 1 drink a day for nonpregnant women and 2 drinks a day for men. One drink equals 12 oz of beer, 5 oz of wine, or 1 oz of hard liquor.  Work with your health care provider to maintain a healthy body weight or to lose weight. Ask what an ideal weight is for you.  Get at least 30 minutes of exercise that causes your heart to beat faster (aerobic exercise) most days of the week. Activities may include walking, swimming, or biking.  Work with your health care provider or diet and nutrition specialist (dietitian) to adjust your eating plan to your individual calorie needs. Reading food labels   Check food labels for the amount of sodium per serving. Choose foods with less than 5 percent of the Daily Value of sodium. Generally, foods with less than 300 mg of sodium per serving fit into this eating plan.  To find whole grains, look for the word "whole" as the first word in the ingredient list. Shopping  Buy products labeled as "low-sodium" or "no salt added."  Buy fresh foods. Avoid canned foods and premade or frozen meals. Cooking  Avoid adding salt when cooking. Use salt-free seasonings or herbs instead of table salt or sea salt. Check with your health care provider or pharmacist before using salt substitutes.  Do not fry foods. Cook foods using healthy methods such as baking, boiling, grilling, and broiling instead.  Cook with  heart-healthy oils, such as olive, canola, soybean, or sunflower oil. Meal planning  Eat a balanced diet that includes: ? 5 or more servings of fruits and vegetables each day. At each meal, try to fill half of your plate with fruits and vegetables. ? Up to 6-8 servings of whole grains each day. ? Less than 6 oz of lean meat, poultry, or fish each day. A 3-oz serving of meat is about the same size as a deck of cards. One egg equals 1 oz. ? 2 servings of low-fat dairy each day. ? A serving of nuts, seeds, or beans 5 times each week. ? Heart-healthy fats. Healthy fats called Omega-3 fatty acids are found in foods such as flaxseeds and coldwater fish, like sardines, salmon, and mackerel.  Limit how much you eat of the following: ? Canned or prepackaged foods. ? Food that is high in trans fat, such as fried foods. ? Food that is high in saturated fat, such as fatty meat. ? Sweets, desserts, sugary drinks, and other foods with added sugar. ? Full-fat dairy products.  Do not salt foods before eating.  Try to eat at least 2 vegetarian meals each week.  Eat more home-cooked food and less restaurant, buffet, and fast food.  When eating at a restaurant, ask that your food be prepared with less salt or no salt, if possible. What foods are recommended? The items listed may not be a complete list. Talk with your dietitian about   what dietary choices are best for you. Grains Whole-grain or whole-wheat bread. Whole-grain or whole-wheat pasta. Brown rice. Oatmeal. Quinoa. Bulgur. Whole-grain and low-sodium cereals. Pita bread. Low-fat, low-sodium crackers. Whole-wheat flour tortillas. Vegetables Fresh or frozen vegetables (raw, steamed, roasted, or grilled). Low-sodium or reduced-sodium tomato and vegetable juice. Low-sodium or reduced-sodium tomato sauce and tomato paste. Low-sodium or reduced-sodium canned vegetables. Fruits All fresh, dried, or frozen fruit. Canned fruit in natural juice (without  added sugar). Meat and other protein foods Skinless chicken or turkey. Ground chicken or turkey. Pork with fat trimmed off. Fish and seafood. Egg whites. Dried beans, peas, or lentils. Unsalted nuts, nut butters, and seeds. Unsalted canned beans. Lean cuts of beef with fat trimmed off. Low-sodium, lean deli meat. Dairy Low-fat (1%) or fat-free (skim) milk. Fat-free, low-fat, or reduced-fat cheeses. Nonfat, low-sodium ricotta or cottage cheese. Low-fat or nonfat yogurt. Low-fat, low-sodium cheese. Fats and oils Soft margarine without trans fats. Vegetable oil. Low-fat, reduced-fat, or light mayonnaise and salad dressings (reduced-sodium). Canola, safflower, olive, soybean, and sunflower oils. Avocado. Seasoning and other foods Herbs. Spices. Seasoning mixes without salt. Unsalted popcorn and pretzels. Fat-free sweets. What foods are not recommended? The items listed may not be a complete list. Talk with your dietitian about what dietary choices are best for you. Grains Baked goods made with fat, such as croissants, muffins, or some breads. Dry pasta or rice meal packs. Vegetables Creamed or fried vegetables. Vegetables in a cheese sauce. Regular canned vegetables (not low-sodium or reduced-sodium). Regular canned tomato sauce and paste (not low-sodium or reduced-sodium). Regular tomato and vegetable juice (not low-sodium or reduced-sodium). Pickles. Olives. Fruits Canned fruit in a light or heavy syrup. Fried fruit. Fruit in cream or butter sauce. Meat and other protein foods Fatty cuts of meat. Ribs. Fried meat. Bacon. Sausage. Bologna and other processed lunch meats. Salami. Fatback. Hotdogs. Bratwurst. Salted nuts and seeds. Canned beans with added salt. Canned or smoked fish. Whole eggs or egg yolks. Chicken or turkey with skin. Dairy Whole or 2% milk, cream, and half-and-half. Whole or full-fat cream cheese. Whole-fat or sweetened yogurt. Full-fat cheese. Nondairy creamers. Whipped toppings.  Processed cheese and cheese spreads. Fats and oils Butter. Stick margarine. Lard. Shortening. Ghee. Bacon fat. Tropical oils, such as coconut, palm kernel, or palm oil. Seasoning and other foods Salted popcorn and pretzels. Onion salt, garlic salt, seasoned salt, table salt, and sea salt. Worcestershire sauce. Tartar sauce. Barbecue sauce. Teriyaki sauce. Soy sauce, including reduced-sodium. Steak sauce. Canned and packaged gravies. Fish sauce. Oyster sauce. Cocktail sauce. Horseradish that you find on the shelf. Ketchup. Mustard. Meat flavorings and tenderizers. Bouillon cubes. Hot sauce and Tabasco sauce. Premade or packaged marinades. Premade or packaged taco seasonings. Relishes. Regular salad dressings. Where to find more information:  National Heart, Lung, and Blood Institute: www.nhlbi.nih.gov  American Heart Association: www.heart.org Summary  The DASH eating plan is a healthy eating plan that has been shown to reduce high blood pressure (hypertension). It may also reduce your risk for type 2 diabetes, heart disease, and stroke.  With the DASH eating plan, you should limit salt (sodium) intake to 2,300 mg a day. If you have hypertension, you may need to reduce your sodium intake to 1,500 mg a day.  When on the DASH eating plan, aim to eat more fresh fruits and vegetables, whole grains, lean proteins, low-fat dairy, and heart-healthy fats.  Work with your health care provider or diet and nutrition specialist (dietitian) to adjust your eating plan to your   individual calorie needs. This information is not intended to replace advice given to you by your health care provider. Make sure you discuss any questions you have with your health care provider. Document Revised: 09/01/2017 Document Reviewed: 09/12/2016 Elsevier Patient Education  2020 [employer].  

## 2020-09-21 NOTE — ED Notes (Signed)
Unable to locate patient in lobby for vital signs/rounding.

## 2020-09-21 NOTE — Assessment & Plan Note (Signed)
Chronic, stable with BP at goal.  At this time will increase Amlodipine to 5 MG BID, which has shown effectiveness with patient doing this at home.  BP stable today and no dizziness.  Recommend cut back on alcohol use.  Labs from ER reviewed.  Stable neuro exam.  Recommend she monitor BP at least a few mornings a week at home and document.  DASH diet at home. Return in 6 weeks.

## 2020-09-21 NOTE — Progress Notes (Signed)
BP 117/80    Pulse 69    Temp 98 F (36.7 C) (Oral)    Ht 5' 3.78" (1.62 m)    Wt 155 lb 9.6 oz (70.6 kg)    SpO2 98%    BMI 26.89 kg/m    Subjective:    Patient ID: Kendra Harrington, female    DOB: 04-Feb-1971, 49 y.o.   MRN: [id number]  HPI: Kendra Harrington is a 49 y.o. female  Chief Complaint  Patient presents with   Dizziness    Bp was high yesterday, called EMS, Dizziness woke her up, was at the ER this morning  had lab work and ekg done this morning, left ER before she was evaluated by ER physician.  Patient states that her BP is always very elevated in the morning, and regulates itself by the afternoon.    DIZZINESS For that last two mornings has been dizzy when gets out of bed, her BP has been high at home.  At home 164/110 and EMT 147/80 this morning, by then she had taken pill.  After she came back from ER she took a second pill -- to equal 10 MG.  She felt benefit from twice a day dosing.  Denies any further dizziness at this time, only the past two mornings.  Labs in ER this morning reassuring.  She denies facial droop, loss of function, or headache.  Does endorse occasional alcohol and MJ use. Duration: days Description of symptoms: ill-defined Duration of episode: seconds Dizziness frequency: no history of the same Provoking factors: none Aggravating factors:  none Triggered by rolling over in bed: no Triggered by bending over: no Aggravated by head movement: no Aggravated by exertion, coughing, loud noises: no Recent head injury: no Recent or current viral symptoms: no History of vasovagal episodes: no Nausea: no Vomiting: no Tinnitus: no Hearing loss: no Aural fullness: no Headache: no Photophobia/phonophobia: no Unsteady gait: no Postural instability: no Diplopia, dysarthria, dysphagia or weakness: no Related to exertion: no Pallor: no Diaphoresis: no Dyspnea: no Chest pain: no  Relevant past medical, surgical, family and social history reviewed and  updated as indicated. Interim medical history since our last visit reviewed. Allergies and medications reviewed and updated.  Review of Systems  Constitutional: Negative for activity change, appetite change, diaphoresis, fatigue and fever.  Respiratory: Negative for cough, chest tightness and shortness of breath.   Cardiovascular: Negative for chest pain, palpitations and leg swelling.  Gastrointestinal: Negative.   Neurological: Positive for dizziness (x 2 episodes, none now). Negative for syncope, facial asymmetry, speech difficulty, weakness, light-headedness, numbness and headaches.  Psychiatric/Behavioral: Negative.     Per HPI unless specifically indicated above     Objective:    BP 117/80    Pulse 69    Temp 98 F (36.7 C) (Oral)    Ht 5' 3.78" (1.62 m)    Wt 155 lb 9.6 oz (70.6 kg)    SpO2 98%    BMI 26.89 kg/m   Wt Readings from Last 3 Encounters:  09/21/20 155 lb 9.6 oz (70.6 kg)  09/21/20 153 lb (69.4 kg)  08/13/20 154 lb (69.9 kg)    Physical Exam Vitals and nursing note reviewed.  Constitutional:      General: She is awake. She is not in acute distress.    Appearance: She is well-developed and well-groomed. She is not ill-appearing.  HENT:     Head: Normocephalic.     Right Ear: Hearing, tympanic membrane, ear canal and external ear  normal.     Left Ear: Hearing, tympanic membrane, ear canal and external ear normal.  Eyes:     General: Lids are normal.        Right eye: No discharge.        Left eye: No discharge.     Extraocular Movements: Extraocular movements intact.     Conjunctiva/sclera: Conjunctivae normal.     Pupils: Pupils are equal, round, and reactive to light.     Visual Fields: Right eye visual fields normal and left eye visual fields normal.  Neck:     Thyroid: No thyromegaly.     Vascular: No carotid bruit.  Cardiovascular:     Rate and Rhythm: Normal rate and regular rhythm.     Heart sounds: Normal heart sounds. No murmur heard. No  gallop.   Pulmonary:     Effort: Pulmonary effort is normal. No accessory muscle usage or respiratory distress.     Breath sounds: Normal breath sounds.  Abdominal:     General: Bowel sounds are normal.     Palpations: Abdomen is soft. There is no hepatomegaly or splenomegaly.  Musculoskeletal:     Cervical back: Normal range of motion and neck supple.     Right lower leg: No edema.     Left lower leg: No edema.  Skin:    General: Skin is warm and dry.  Neurological:     Mental Status: She is alert and oriented to person, place, and time.     Cranial Nerves: Cranial nerves are intact.     Motor: Motor function is intact.     Coordination: Coordination is intact.     Gait: Gait is intact.     Deep Tendon Reflexes: Reflexes are normal and symmetric.     Reflex Scores:      Brachioradialis reflexes are 2+ on the right side and 2+ on the left side.      Patellar reflexes are 2+ on the right side and 2+ on the left side. Psychiatric:        Attention and Perception: Attention normal.        Mood and Affect: Mood normal.        Speech: Speech normal.        Behavior: Behavior normal. Behavior is cooperative.        Thought Content: Thought content normal.     Results for orders placed or performed during the hospital encounter of 71/06/26  Basic metabolic panel  Result Value Ref Range   Sodium 138 135 - 145 mmol/L   Potassium 4.1 3.5 - 5.1 mmol/L   Chloride 105 98 - 111 mmol/L   CO2 25 22 - 32 mmol/L   Glucose, Bld 104 (H) 70 - 99 mg/dL   BUN 19 6 - 20 mg/dL   Creatinine, Ser 1.13 (H) 0.44 - 1.00 mg/dL   Calcium 9.1 8.9 - 10.3 mg/dL   GFR, Estimated 59 (L) >60 mL/min   Anion gap 8 5 - 15  CBC  Result Value Ref Range   WBC 5.2 4.0 - 10.5 K/uL   RBC 4.32 3.87 - 5.11 MIL/uL   Hemoglobin 12.2 12.0 - 15.0 g/dL   HCT 37.9 36.0 - 46.0 %   MCV 87.7 80.0 - 100.0 fL   MCH 28.2 26.0 - 34.0 pg   MCHC 32.2 30.0 - 36.0 g/dL   RDW 13.8 11.5 - 15.5 %   Platelets 238 150 - 400 K/uL    nRBC 0.0 0.0 -  0.2 %      Assessment & Plan:   Problem List Items Addressed This Visit      Cardiovascular and Mediastinum   Essential hypertension    Chronic, stable with BP at goal.  At this time will increase Amlodipine to 5 MG BID, which has shown effectiveness with patient doing this at home.  BP stable today and no dizziness.  Recommend cut back on alcohol use.  Labs from ER reviewed.  Stable neuro exam.  Recommend she monitor BP at least a few mornings a week at home and document.  DASH diet at home. Return in 6 weeks.      Relevant Medications   amLODipine (NORVASC) 5 MG tablet     Other   Dizziness    X 2 episodes and no further, suspect a mix of elevation in BP and anxiety.  Will increase Amlodipine to 5 MG BID, which has shown improvement on BP today.  No red flag symptoms and normal neuro exam.  Labs this morning in ER reviewed.  Plan for return in 6 weeks, sooner if worsening or return of symptoms.          Follow up plan: Return in about 6 weeks (around 11/02/2020) for BP check.

## 2020-10-23 NOTE — Progress Notes (Signed)
NIPPLE AREOLAR TATTOO PROCEDURE  PREOPERATIVE DIAGNOSIS:  Acquired absence of (BILATERAL) nipple areolar   POSTOPERATIVE DIAGNOSIS: Acquired absence of (BILATERAL) nipple areolar    PROCEDURES: (BILATERAL)nipple areolar tattoo   ATTENDING SURGEON: Dr. Lyndee Leo Dillingham   ANESTHESIA:  EMLA  COMPLICATIONS: None.  JUSTIFICATION FOR PROCEDURE:  Ms. Cassandra Tran is a 50 y.o. female with a history of breast cancer status post bilateral breast reconstruction. The patient presents for bilateral nipple areolar complex tattoo. Risks, benefits, indications, and alternatives of the above described procedures were discussed with the patient and all the patient's questions were answered.   DESCRIPTION OF PROCEDURE: After informed consent was obtained and proper identification of patient and surgical site was made, the patient was taken to the procedure room and pre-procedure photos taken & entered into chart. Pt was then placed supine on the operating room table. A time out was performed to confirm patient's identity and surgical site. The patient was prepped and draped in the usual sterile fashion.  Using a # 7 tattoo head, pigment was instilled to the designed nipple areolar complex. Once adequate pigment had been applied to both nipple areolar complex, a post procedure photo taken.  vaseline/ xeroform & gauze dressing was applied. The patient tolerated the procedure well.   Tattoo ink used- World Famous Tattoo Ink: Dark Mink/ [place]/ [place] Duration used as needed for anesthesia Lot #'s & exp dates on record

## 2020-10-29 ENCOUNTER — Ambulatory Visit: Payer: 59 | Admitting: Family Medicine

## 2020-10-29 NOTE — Progress Notes (Deleted)
    SUBJECTIVE:   CHIEF COMPLAINT / HPI:   Patient Active Problem List   Diagnosis Date Noted  . Dizziness 09/21/2020  . Overweight (BMI 25.0-29.9) 12/31/2019  . Breast asymmetry following reconstructive surgery 02/11/2019  . Acquired absence of breast and absent nipple, bilateral 11/23/2018  . S/P breast reconstruction, bilateral 08/14/2018  . Malignant neoplasm of upper-outer quadrant of right breast in female, estrogen receptor positive ([facility]) 08/02/2018  . Ductal carcinoma in situ (DCIS) of left breast 11/22/2017  . Essential hypertension 10/20/2017   Hypertension: - previously seen 12/20 for dizziness, BP high at that time.  - Medications: amlodipine 5mg  BID - Compliance: *** - Checking BP at home: *** - Denies any SOB, CP, vision changes, LE edema, medication SEs, or symptoms of hypotension - Diet: *** - Exercise: ***   OBJECTIVE:   There were no vitals taken for this visit.  ***  ASSESSMENT/PLAN:   No problem-specific Assessment & Plan notes found for this encounter.     Myles Gip, DO [place]

## 2020-11-09 ENCOUNTER — Ambulatory Visit: Payer: 59

## 2020-11-13 ENCOUNTER — Ambulatory Visit (INDEPENDENT_AMBULATORY_CARE_PROVIDER_SITE_OTHER): Payer: 59

## 2020-11-13 ENCOUNTER — Other Ambulatory Visit: Payer: Self-pay

## 2020-11-13 VITALS — BP 140/90 | HR 80 | Temp 97.5°F | Ht 64.0 in | Wt 155.0 lb

## 2020-11-13 DIAGNOSIS — Z9013 Acquired absence of bilateral breasts and nipples: Secondary | ICD-10-CM | POA: Diagnosis not present

## 2020-11-17 ENCOUNTER — Ambulatory Visit: Payer: Self-pay | Admitting: Nurse Practitioner

## 2020-11-17 ENCOUNTER — Other Ambulatory Visit: Payer: Self-pay | Admitting: Oncology

## 2020-11-17 NOTE — Telephone Encounter (Signed)
Pt calling to request refill of Tamoxifen. Prescribed by Dr. Delight Hoh. Advised to call oncologist, number provided, pt verbalizes understanding.  Answer Assessment - Initial Assessment Questions 1. DRUG NAME: "What medicine do you need to have refilled?"   Tamoxifen 2. REFILLS REMAINING: "How many refills are remaining?" (Note: The label on the medicine or pill bottle will show how many refills are remaining. If there are no refills remaining, then a renewal may be needed.)     "Have not had any for a month" 3. EXPIRATION DATE: "What is the expiration date?" (Note: The label states when the prescription will expire, and thus can no longer be refilled.)      4. PRESCRIBING HCP: "Who prescribed it?" Reason: If prescribed by specialist, call should be referred to that group.     Dr. Grayland Ormond 5. SYMPTOMS: "Do you have any symptoms?"    no  Protocols used: MEDICATION REFILL AND RENEWAL CALL-A-AH

## 2020-11-18 ENCOUNTER — Telehealth: Payer: Self-pay | Admitting: Oncology

## 2020-11-18 NOTE — Telephone Encounter (Signed)
Patient called to schedule an appt--she stated that she needed to see the MD in order to have her RX re-filled. Scheduled for 3/2. Message sent to the team to notify.

## 2020-11-23 NOTE — Patient Instructions (Signed)
Pt is reminded that she will need to use vaseline/xeroform & gauze for approx 10 days. She understands that the colors/shades may fade- requiring touch-up sessions in the future. She will call for any concerns- otherwise- f/u in 6 weeks

## 2020-11-23 NOTE — Progress Notes (Signed)
NIPPLE AREOLAR TATTOO PROCEDURE  PREOPERATIVE DIAGNOSIS:  Acquired absence of (BILATERAL) nipple areolar   POSTOPERATIVE DIAGNOSIS: Acquired absence of (BILATERAL) nipple areolar    PROCEDURES: (BILATERAL)nipple areolar tattoo - touch-up  ATTENDING SURGEON: Dr. Lyndee Leo Dillingham  ANESTHESIA:  EMLA  COMPLICATIONS: None.  JUSTIFICATION FOR PROCEDURE:  Cassandra Tran is a 50 y.o. female with a history of breast cancer status post bilateral breast reconstruction. The patient presents for bilateral nipple areolar complex tattoo touch-up. Risks, benefits, indications, and alternatives of the above described procedures were discussed with the patient and all the patient's questions were answered.   DESCRIPTION OF PROCEDURE: After informed consent was obtained and proper identification of patient and surgical site was made, the patient was taken to the procedure room and pre-procedure photos taken. Pt was then placed supine on the operating room table. A time out was performed to confirm patient's identity and surgical site. The patient was prepped and draped in the usual sterile fashion.  Using a #7  tattoo head, pigment was instilled to the designed nipple areolar complex.  Once adequate pigment had been applied to the nipple areolar complex,  post-procedure photos taken. vaseline/ xeroform/ & gauze dressing was applied. The patient tolerated the procedure well.   Tattoo Ink used: World Famous Ink- Cool Mink/ Warm Honey/ Fair Honey/ [place]ach/ [place] Duration as needed for anesthesia Lot #'s & exp dates on file

## 2020-11-28 NOTE — Progress Notes (Signed)
[place]  Telephone:(336) [phone] Fax:(336) [phone]  ID: Cassandra Tran OB: [birth year]  MR#: [id number]  DUK#:[id number]  Patient Care Team: Jon Billings, NP as PCP - General  CHIEF COMPLAINT: Pathologic stage Ia ER/PR positive, HER-2 negative invasive carcinoma of the upper outer quadrant of the right breast. DCIS of left breast.  INTERVAL HISTORY: Patient was last evaluated in clinic nearly 2 years ago.  She returns to clinic today for further evaluation.  She continues to feel well and remains asymptomatic.  She is tolerating tamoxifen without significant side effects although admits that she ran out of medication several months ago.  She had bilateral mastectomies and now has nearly completed her reconstructive surgery. She has no neurologic complaints.  She denies any recent fevers or illnesses.  She has a good appetite and denies weight loss.  She denies any chest pain, shortness of breath, cough, or hemoptysis.  She denies any nausea, vomiting, constipation, or diarrhea.  She has no urinary complaints.  Patient offers no specific complaints today.  REVIEW OF SYSTEMS:   Review of Systems  Constitutional: Negative.  Negative for fever and malaise/fatigue.  Respiratory: Negative.  Negative for cough, hemoptysis and shortness of breath.   Cardiovascular: Negative.  Negative for chest pain and leg swelling.  Gastrointestinal: Negative.  Negative for abdominal pain.  Genitourinary: Negative.  Negative for dysuria.  Musculoskeletal: Negative.  Negative for back pain.  Skin: Negative.  Negative for rash.  Neurological: Negative.  Negative for focal weakness, weakness and headaches.  Psychiatric/Behavioral: Negative.  The patient is not nervous/anxious.     As per HPI. Otherwise, a complete review of systems is negative.  PAST MEDICAL HISTORY: Past Medical History:  Diagnosis Date  . BRCA negative 12/26/2017   [facility]   . Cancer ([place]) 11/20/2017   DUCTAL  CARCINOMA IN SITU left breast  . Hypertension     PAST SURGICAL HISTORY: Past Surgical History:  Procedure Laterality Date  . BREAST BIOPSY Right 02/21/2014   upper-outer calcs=atypical ductal hyperplasia and background of columnar cell change w/ calcifications. cytologic atypia per rad report from [place].  Marland Kitchen BREAST BIOPSY Right 02/21/2014   Central Right calcs=atypical ductal hyperplasia with atypical lobular hyperplasia in a background of columnar cell change with calcifications and papillary apocrine metaplasia per rad report from [place]  . BREAST BIOPSY Left 11/20/2017   Affirm Bx- DUCTAL CARCINOMA IN SITU  . BREAST BIOPSY Right 11/20/2017   Affirm Bx of 2 areas-  ATYPICAL DUCTAL HYPERPLASIA and PSEUDO-ANGIOMATOUS STROMAL HYPERPLASIA  . BREAST BIOPSY Bilateral 05/18/2018   Procedure: BREAST BIOPSY WITH NEEDLE LOCALIZATION;  Surgeon: Robert Bellow, MD;  Location: [facility];  Service: General;  Laterality: Bilateral;  . BREAST EXCISIONAL BIOPSY Right 2015   "Benign" per pt  . BREAST EXCISIONAL BIOPSY Left 05/18/2018   NL for  lumpectomy - DCIS  . BREAST EXCISIONAL BIOPSY Right 05/18/2018   NL for lumpectomy for 2 areas of atypical ductal hyperplasia   . BREAST RECONSTRUCTION WITH PLACEMENT OF TISSUE EXPANDER AND FLEX HD (ACELLULAR HYDRATED DERMIS) Bilateral 08/08/2018   Procedure: BREAST RECONSTRUCTION WITH PLACEMENT OF TISSUE EXPANDER AND FLEX HD (ACELLULAR HYDRATED DERMIS);  Surgeon: Wallace Going, DO;  Location: [facility];  Service: Plastics;  Laterality: Bilateral;  . CESAREAN SECTION    . LIPOSUCTION WITH LIPOFILLING Bilateral 03/06/2019   Procedure: Fat grafting to bilateral breast from abdomen;  Surgeon: Wallace Going, DO;  Location: [place];  Service: Plastics;  Laterality: Bilateral;  90  min, please  . MASTECTOMY W/ SENTINEL NODE BIOPSY Bilateral 08/08/2018   Procedure: MASTECTOMY WITH SENTINEL LYMPH NODE BIOPSY;  Surgeon: Robert Bellow,  MD;  Location: [facility];  Service: General;  Laterality: Bilateral;  . REMOVAL OF BILATERAL TISSUE EXPANDERS WITH PLACEMENT OF BILATERAL BREAST IMPLANTS Bilateral 11/28/2018   Procedure: REMOVAL OF BILATERAL TISSUE EXPANDERS WITH PLACEMENT OF BILATERAL BREAST IMPLANTS;  Surgeon: Wallace Going, DO;  Location: [place];  Service: Plastics;  Laterality: Bilateral;  120 min, please    FAMILY HISTORY: Family History  Problem Relation Age of Onset  . Hypertension Mother   . Stroke Father   . Breast cancer Neg Hx     ADVANCED DIRECTIVES (Y/N):  N  HEALTH MAINTENANCE: Social History   Tobacco Use  . Smoking status: Never Smoker  . Smokeless tobacco: Never Used  . Tobacco comment: Social  Vaping Use  . Vaping Use: Some days  . Substances: Nicotine  Substance Use Topics  . Alcohol use: Yes  . Drug use: Yes    Types: Marijuana    Comment: on occasion     Colonoscopy:  PAP:  Bone density:  Lipid panel:  Allergies  Allergen Reactions  . Peanut-Containing Drug Products Anaphylaxis  . Toradol [Ketorolac Tromethamine] Shortness Of Breath and Itching  . Carrot Oil Itching and Other (See Comments)    Throat itchs  . Hydrocodone     Itching, shortness of breath  . Other     Green Beans    Current Outpatient Medications  Medication Sig Dispense Refill  . amLODipine (NORVASC) 5 MG tablet Take 1 tablet (5 mg total) by mouth in the morning and at bedtime. 180 tablet 4  . buPROPion (WELLBUTRIN XL) 300 MG 24 hr tablet Take 1 tablet (300 mg total) by mouth daily. 90 tablet 4  . lidocaine-prilocaine (EMLA) cream Apply 1 application topically as needed. Apply 30 minutes prior to procedure 30 g 0  . tamoxifen (NOLVADEX) 20 MG tablet Take 1 tablet (20 mg total) by mouth daily. 90 tablet 3   No current facility-administered medications for this visit.    OBJECTIVE: Vitals:   12/02/20 1003  BP: (!) 140/102  Pulse: 82  Resp: 18  Temp: 97.6 F (36.4 C)     Body  mass index is 27.38 kg/m.    ECOG FS:0 - Asymptomatic  General: Well-developed, well-nourished, no acute distress. Eyes: Pink conjunctiva, anicteric sclera. HEENT: Normocephalic, moist mucous membranes. Breast: Bilateral mastectomy with reconstruction. Lungs: No audible wheezing or coughing. Heart: Regular rate and rhythm. Abdomen: Soft, nontender, no obvious distention. Musculoskeletal: No edema, cyanosis, or clubbing. Neuro: Alert, answering all questions appropriately. Cranial nerves grossly intact. Skin: No rashes or petechiae noted. Psych: Normal affect.   LAB RESULTS:  Lab Results  Component Value Date   NA 138 09/21/2020   K 4.1 09/21/2020   CL 105 09/21/2020   CO2 25 09/21/2020   GLUCOSE 104 (H) 09/21/2020   BUN 19 09/21/2020   CREATININE 1.13 (H) 09/21/2020   CALCIUM 9.1 09/21/2020   PROT 7.8 10/21/2019   ALBUMIN 4.8 10/21/2019   AST 24 10/21/2019   ALT 19 10/21/2019   ALKPHOS 85 10/21/2019   BILITOT 0.2 10/21/2019   GFRNONAA 59 (L) 09/21/2020   GFRAA 72 08/13/2020    Lab Results  Component Value Date   WBC 5.2 09/21/2020   NEUTROABS 2.3 10/21/2019   HGB 12.2 09/21/2020   HCT 37.9 09/21/2020   MCV 87.7 09/21/2020  PLT 238 09/21/2020     STUDIES: No results found.  ASSESSMENT: Pathologic stage Ia ER/PR positive, HER-2 negative invasive carcinoma of the upper outer quadrant of the right breast. DCIS of left breast  PLAN:    1.  Pathologic stage Ia ER/PR positive, HER-2 negative invasive carcinoma of the upper outer quadrant of the right breast: Patient has now completed bilateral mastectomy with reconstruction.  Oncotype DX score was not able to be done secondary to insufficient tissue.  Patient did not require adjuvant chemotherapy or XRT.  Patient was given a refill of tamoxifen today.  She will continue a minimum of 5 years of treatment completing in November 2024.  Can consider extending tamoxifen given her age and bilateral breast lesions.  Patient  has undergone bilateral mastectomy, therefore no further mammograms are necessary.  Patient with video-assisted visit in 1 year for further evaluation.   2. DCIS of left breast: Tamoxifen as above. 3.  Breast reconstruction: Continue follow-up with plastic surgery as scheduled. 4.  Genetic testing: Negative.  Patient expressed understanding and was in agreement with this plan. She also understands that She can call clinic at any time with any questions, concerns, or complaints.   Cancer Staging Malignant neoplasm of upper-outer quadrant of right breast in female, estrogen receptor positive ([place]) Staging form: Breast, AJCC 8th Edition - Clinical stage from 08/26/2018: Stage IA (cT1a, cN0, cM0, G1, ER+, PR+, HER2-) - Signed by Lloyd Huger, MD on 08/26/2018 Histologic grading system: 3 grade system Laterality: Right   Lloyd Huger, MD   12/04/2020 5:52 AM  Addendum: Insufficient tissue for further testing with Oncotype DX.  Proceed with tamoxifen as above.   Lloyd Huger, MD 12/04/20 5:52 AM

## 2020-12-02 ENCOUNTER — Other Ambulatory Visit: Payer: Self-pay

## 2020-12-02 ENCOUNTER — Encounter: Payer: Self-pay | Admitting: Oncology

## 2020-12-02 ENCOUNTER — Inpatient Hospital Stay: Payer: 59 | Attending: Oncology | Admitting: Oncology

## 2020-12-02 VITALS — BP 140/102 | HR 82 | Temp 97.6°F | Resp 18 | Wt 159.5 lb

## 2020-12-02 DIAGNOSIS — Z9013 Acquired absence of bilateral breasts and nipples: Secondary | ICD-10-CM | POA: Insufficient documentation

## 2020-12-02 DIAGNOSIS — Z803 Family history of malignant neoplasm of breast: Secondary | ICD-10-CM | POA: Diagnosis not present

## 2020-12-02 DIAGNOSIS — Z79810 Long term (current) use of selective estrogen receptor modulators (SERMs): Secondary | ICD-10-CM | POA: Diagnosis not present

## 2020-12-02 DIAGNOSIS — Z8249 Family history of ischemic heart disease and other diseases of the circulatory system: Secondary | ICD-10-CM | POA: Diagnosis not present

## 2020-12-02 DIAGNOSIS — C50411 Malignant neoplasm of upper-outer quadrant of right female breast: Secondary | ICD-10-CM | POA: Insufficient documentation

## 2020-12-02 DIAGNOSIS — Z79899 Other long term (current) drug therapy: Secondary | ICD-10-CM | POA: Insufficient documentation

## 2020-12-02 DIAGNOSIS — I10 Essential (primary) hypertension: Secondary | ICD-10-CM | POA: Diagnosis not present

## 2020-12-02 DIAGNOSIS — Z170 Estrogen receptor positive status [ER+]: Secondary | ICD-10-CM | POA: Insufficient documentation

## 2020-12-02 MED ORDER — TAMOXIFEN CITRATE 20 MG PO TABS: 20.0000 mg | ORAL_TABLET | Freq: Every day | ORAL | 3 refills | Status: DC

## 2020-12-02 NOTE — Progress Notes (Signed)
Pt in for follow up, has not had tamoxifen since November 2021. Pt states pharmacy was told she needed to follow up before med could be refilled.  Pt has had double mastectomy with reconstruction.

## 2021-01-04 ENCOUNTER — Other Ambulatory Visit: Payer: Self-pay

## 2021-01-04 ENCOUNTER — Ambulatory Visit (INDEPENDENT_AMBULATORY_CARE_PROVIDER_SITE_OTHER): Payer: 59

## 2021-01-04 VITALS — BP 135/73 | HR 89

## 2021-01-04 DIAGNOSIS — Z9013 Acquired absence of bilateral breasts and nipples: Secondary | ICD-10-CM | POA: Diagnosis not present

## 2021-01-07 NOTE — Progress Notes (Signed)
NIPPLE AREOLAR TATTOO PROCEDURE  PREOPERATIVE DIAGNOSIS:  Acquired absence of (BILATERAL) nipple areolar   POSTOPERATIVE DIAGNOSIS: Acquired absence of (BILATERAL) nipple areolar    PROCEDURES: (BILATERAL) nipple areolar tattoo touch-up  ATTENDING SURGEON: Dr. Lyndee Leo Dillingham  ANESTHESIA:  EMLA  COMPLICATIONS: None.  JUSTIFICATION FOR PROCEDURE:  Cassandra Tran is a 50 y.o. female with a history of breast cancer status post bilateral breast reconstruction. The patient presents for nipple areolar complex tattoo touch-up.  Risks, benefits, indications, and alternatives of the above described procedures were discussed with the patient and all the patient's questions were answered.   DESCRIPTION OF PROCEDURE: After informed consent was obtained and proper identification of patient and surgical site was made, the patient was taken to the procedure room and pre-procedure photos were taken & entered into chart. Patient was then placed supine on the operating room table. A time out was performed to confirm patient's identity and surgical site. The patient was prepped and draped in the usual sterile fashion.   Using a # 7  tattoo head, pigment was instilled to the designed bilateral nipple areolar complex.  I concentrated on instilling ink into the center of each NAC midline scar d/t the loss of pigment in that area.  Once adequate pigment had been applied to the nipple areolar complexes,  A post-procedure photo taken. vaseline/ xeroform & gauze dressing was applied.  The patient tolerated the procedure well.  World Famous Tattoo Ink used: Warm Honey/ Mona Lisa Skin/ Tan [employer] & Duration used as needed for anesthesia Lot#'s & exp dates on file

## 2021-01-07 NOTE — Patient Instructions (Signed)
Pt is reminded to use vaseline/xeroform/gauze dressings for approx 5-7 days She understands that d/t fading and difficulty with maintaining ink in the surgical scar tissue- she may need further touch-up sessions. She will call for any questions or concerns. F/U 6-8 weeks

## 2021-02-04 ENCOUNTER — Other Ambulatory Visit: Payer: Self-pay

## 2021-02-04 DIAGNOSIS — Z79810 Long term (current) use of selective estrogen receptor modulators (SERMs): Secondary | ICD-10-CM

## 2021-02-04 DIAGNOSIS — K529 Noninfective gastroenteritis and colitis, unspecified: Secondary | ICD-10-CM | POA: Diagnosis not present

## 2021-02-04 DIAGNOSIS — C50919 Malignant neoplasm of unspecified site of unspecified female breast: Secondary | ICD-10-CM | POA: Diagnosis present

## 2021-02-04 DIAGNOSIS — F17210 Nicotine dependence, cigarettes, uncomplicated: Secondary | ICD-10-CM | POA: Diagnosis present

## 2021-02-04 DIAGNOSIS — I10 Essential (primary) hypertension: Secondary | ICD-10-CM | POA: Diagnosis present

## 2021-02-04 DIAGNOSIS — A419 Sepsis, unspecified organism: Secondary | ICD-10-CM | POA: Diagnosis not present

## 2021-02-04 DIAGNOSIS — N179 Acute kidney failure, unspecified: Secondary | ICD-10-CM | POA: Diagnosis present

## 2021-02-04 DIAGNOSIS — Z7982 Long term (current) use of aspirin: Secondary | ICD-10-CM

## 2021-02-04 DIAGNOSIS — Z9013 Acquired absence of bilateral breasts and nipples: Secondary | ICD-10-CM

## 2021-02-04 DIAGNOSIS — Z79899 Other long term (current) drug therapy: Secondary | ICD-10-CM

## 2021-02-04 DIAGNOSIS — Z20822 Contact with and (suspected) exposure to covid-19: Secondary | ICD-10-CM | POA: Diagnosis present

## 2021-02-04 DIAGNOSIS — F32A Depression, unspecified: Secondary | ICD-10-CM | POA: Diagnosis present

## 2021-02-04 DIAGNOSIS — A044 Other intestinal Escherichia coli infections: Secondary | ICD-10-CM | POA: Diagnosis present

## 2021-02-04 LAB — COMPREHENSIVE METABOLIC PANEL
ALT: 21 U/L (ref 0–44)
AST: 27 U/L (ref 15–41)
Albumin: 4.4 g/dL (ref 3.5–5.0)
Alkaline Phosphatase: 59 U/L (ref 38–126)
Anion gap: 10 (ref 5–15)
BUN: 13 mg/dL (ref 6–20)
CO2: 23 mmol/L (ref 22–32)
Calcium: 9.3 mg/dL (ref 8.9–10.3)
Chloride: 104 mmol/L (ref 98–111)
Creatinine, Ser: 1.14 mg/dL — ABNORMAL HIGH (ref 0.44–1.00)
GFR, Estimated: 59 mL/min — ABNORMAL LOW (ref >60)
Glucose, Bld: 120 mg/dL — ABNORMAL HIGH (ref 70–99)
Potassium: 4.2 mmol/L (ref 3.5–5.1)
Sodium: 137 mmol/L (ref 135–145)
Total Bilirubin: 0.9 mg/dL (ref 0.3–1.2)
Total Protein: 7.9 g/dL (ref 6.5–8.1)

## 2021-02-04 LAB — CBC
HCT: 39.8 % (ref 36.0–46.0)
Hemoglobin: 12.5 g/dL (ref 12.0–15.0)
MCH: 27.7 pg (ref 26.0–34.0)
MCHC: 31.4 g/dL (ref 30.0–36.0)
MCV: 88.2 fL (ref 80.0–100.0)
Platelets: 266 10*3/uL (ref 150–400)
RBC: 4.51 MIL/uL (ref 3.87–5.11)
RDW: 14.2 % (ref 11.5–15.5)
WBC: 11.2 10*3/uL — ABNORMAL HIGH (ref 4.0–10.5)
nRBC: 0 % (ref 0.0–0.2)

## 2021-02-04 LAB — LIPASE, BLOOD: Lipase: 34 U/L (ref 11–51)

## 2021-02-04 MED ORDER — ONDANSETRON HCL 4 MG/2ML IJ SOLN
INTRAMUSCULAR | Status: AC
  Administered 2021-02-04: 4 mg via INTRAVENOUS
  Filled 2021-02-04: qty 2

## 2021-02-04 MED ORDER — ONDANSETRON 4 MG PO TBDP: 4.0000 mg | ORAL_TABLET | Freq: Once | ORAL | Status: DC | PRN

## 2021-02-04 MED ORDER — ONDANSETRON HCL 4 MG/2ML IJ SOLN: 4.0000 mg | Freq: Once | INTRAMUSCULAR | Status: AC

## 2021-02-04 NOTE — ED Triage Notes (Signed)
EMS brings pt in from home for abd pain, N/V/D x 4 days

## 2021-02-04 NOTE — ED Triage Notes (Signed)
Pt coming in from home via EMS for diffuse abdominal pain, nausea, vomiting, and diarrhea x4 days. Denies urinary symptoms. Actively vomiting in triage.

## 2021-02-05 ENCOUNTER — Inpatient Hospital Stay
Admission: EM | Admit: 2021-02-05 | Discharge: 2021-02-06 | DRG: 872 | Disposition: A | Discharge status: Home / Self Care | Payer: Self-pay | Attending: Internal Medicine | Admitting: Internal Medicine

## 2021-02-05 ENCOUNTER — Encounter: Payer: Self-pay | Admitting: Internal Medicine

## 2021-02-05 ENCOUNTER — Emergency Department: Payer: Self-pay

## 2021-02-05 DIAGNOSIS — F32A Depression, unspecified: Secondary | ICD-10-CM | POA: Diagnosis present

## 2021-02-05 DIAGNOSIS — Z7982 Long term (current) use of aspirin: Secondary | ICD-10-CM | POA: Diagnosis not present

## 2021-02-05 DIAGNOSIS — I10 Essential (primary) hypertension: Secondary | ICD-10-CM | POA: Diagnosis present

## 2021-02-05 DIAGNOSIS — K529 Noninfective gastroenteritis and colitis, unspecified: Secondary | ICD-10-CM | POA: Diagnosis present

## 2021-02-05 DIAGNOSIS — N179 Acute kidney failure, unspecified: Secondary | ICD-10-CM | POA: Diagnosis present

## 2021-02-05 DIAGNOSIS — Z79899 Other long term (current) drug therapy: Secondary | ICD-10-CM | POA: Diagnosis not present

## 2021-02-05 DIAGNOSIS — F17210 Nicotine dependence, cigarettes, uncomplicated: Secondary | ICD-10-CM | POA: Diagnosis present

## 2021-02-05 DIAGNOSIS — C50919 Malignant neoplasm of unspecified site of unspecified female breast: Secondary | ICD-10-CM | POA: Diagnosis present

## 2021-02-05 DIAGNOSIS — Z20822 Contact with and (suspected) exposure to covid-19: Secondary | ICD-10-CM | POA: Diagnosis present

## 2021-02-05 DIAGNOSIS — A419 Sepsis, unspecified organism: Secondary | ICD-10-CM | POA: Diagnosis present

## 2021-02-05 DIAGNOSIS — A040 Enteropathogenic Escherichia coli infection: Secondary | ICD-10-CM

## 2021-02-05 DIAGNOSIS — R197 Diarrhea, unspecified: Secondary | ICD-10-CM

## 2021-02-05 DIAGNOSIS — R112 Nausea with vomiting, unspecified: Secondary | ICD-10-CM

## 2021-02-05 DIAGNOSIS — A044 Other intestinal Escherichia coli infections: Secondary | ICD-10-CM | POA: Diagnosis present

## 2021-02-05 DIAGNOSIS — Z720 Tobacco use: Secondary | ICD-10-CM | POA: Diagnosis present

## 2021-02-05 DIAGNOSIS — Z9013 Acquired absence of bilateral breasts and nipples: Secondary | ICD-10-CM | POA: Diagnosis not present

## 2021-02-05 DIAGNOSIS — Z79810 Long term (current) use of selective estrogen receptor modulators (SERMs): Secondary | ICD-10-CM | POA: Diagnosis not present

## 2021-02-05 HISTORY — DX: Malignant neoplasm of unspecified site of unspecified female breast: C50.919

## 2021-02-05 HISTORY — DX: Essential (primary) hypertension: I10

## 2021-02-05 HISTORY — DX: Tobacco use: Z72.0

## 2021-02-05 LAB — URINALYSIS, COMPLETE (UACMP) WITH MICROSCOPIC
Bacteria, UA: NONE SEEN
Bilirubin Urine: NEGATIVE
Glucose, UA: NEGATIVE mg/dL
Hgb urine dipstick: NEGATIVE
Ketones, ur: 20 mg/dL — AB
Leukocytes,Ua: NEGATIVE
Nitrite: NEGATIVE
Protein, ur: NEGATIVE mg/dL
Specific Gravity, Urine: 1.019 (ref 1.005–1.030)
pH: 6 (ref 5.0–8.0)

## 2021-02-05 LAB — RESP PANEL BY RT-PCR (FLU A&B, COVID) ARPGX2
Influenza A by PCR: NEGATIVE
Influenza B by PCR: NEGATIVE
SARS Coronavirus 2 by RT PCR: NEGATIVE

## 2021-02-05 LAB — HCG, QUANTITATIVE, PREGNANCY: hCG, Beta Chain, Quant, S: <1 m[IU]/mL (ref <5)

## 2021-02-05 LAB — POC URINE PREG, ED: Preg Test, Ur: NEGATIVE

## 2021-02-05 LAB — PROCALCITONIN: Procalcitonin: <0.1 ng/mL

## 2021-02-05 LAB — LACTIC ACID, PLASMA: Lactic Acid, Venous: 0.7 mmol/L (ref 0.5–1.9)

## 2021-02-05 LAB — HIV ANTIBODY (ROUTINE TESTING W REFLEX): HIV Screen 4th Generation wRfx: NONREACTIVE

## 2021-02-05 MED ORDER — PIPERACILLIN-TAZOBACTAM 3.375 G IVPB 30 MIN
3.3750 g | Freq: Once | INTRAVENOUS | Status: AC
  Administered 2021-02-05: 3.375 g via INTRAVENOUS
  Filled 2021-02-05: qty 50

## 2021-02-05 MED ORDER — NICOTINE 21 MG/24HR TD PT24
21.0000 mg | MEDICATED_PATCH | Freq: Every day | TRANSDERMAL | Status: DC
  Filled 2021-02-05: qty 1

## 2021-02-05 MED ORDER — ONDANSETRON HCL 4 MG/2ML IJ SOLN
4.0000 mg | Freq: Once | INTRAMUSCULAR | Status: AC
  Administered 2021-02-05: 4 mg via INTRAVENOUS
  Filled 2021-02-05: qty 2

## 2021-02-05 MED ORDER — IOHEXOL 300 MG/ML  SOLN
100.0000 mL | Freq: Once | INTRAMUSCULAR | Status: AC | PRN
  Administered 2021-02-05: 100 mL via INTRAVENOUS

## 2021-02-05 MED ORDER — MORPHINE SULFATE (PF) 2 MG/ML IV SOLN
2.0000 mg | INTRAVENOUS | Status: DC | PRN
  Administered 2021-02-05 – 2021-02-06 (×4): 2 mg via INTRAVENOUS
  Filled 2021-02-05 (×4): qty 1

## 2021-02-05 MED ORDER — ACETAMINOPHEN 325 MG PO TABS: 650.0000 mg | ORAL_TABLET | Freq: Four times a day (QID) | ORAL | Status: DC | PRN

## 2021-02-05 MED ORDER — MORPHINE SULFATE (PF) 4 MG/ML IV SOLN
4.0000 mg | Freq: Once | INTRAVENOUS | Status: AC
  Administered 2021-02-05: 4 mg via INTRAVENOUS
  Filled 2021-02-05: qty 1

## 2021-02-05 MED ORDER — SODIUM CHLORIDE 0.9 % IV BOLUS
1000.0000 mL | Freq: Once | INTRAVENOUS | Status: AC
  Administered 2021-02-05: 1000 mL via INTRAVENOUS

## 2021-02-05 MED ORDER — SODIUM CHLORIDE 0.9 % IV BOLUS (SEPSIS)
1000.0000 mL | Freq: Once | INTRAVENOUS | Status: AC
  Administered 2021-02-05: 1000 mL via INTRAVENOUS

## 2021-02-05 MED ORDER — BUPROPION HCL ER (XL) 150 MG PO TB24
300.0000 mg | ORAL_TABLET | Freq: Every day | ORAL | Status: DC
  Administered 2021-02-05 – 2021-02-06 (×2): 300 mg via ORAL
  Filled 2021-02-05 (×2): qty 2

## 2021-02-05 MED ORDER — HYDRALAZINE HCL 20 MG/ML IJ SOLN: 5.0000 mg | INTRAMUSCULAR | Status: DC | PRN

## 2021-02-05 MED ORDER — PIPERACILLIN-TAZOBACTAM 3.375 G IVPB
3.3750 g | Freq: Three times a day (TID) | INTRAVENOUS | Status: DC
  Administered 2021-02-05 – 2021-02-06 (×3): 3.375 g via INTRAVENOUS
  Filled 2021-02-05 (×3): qty 50

## 2021-02-05 MED ORDER — TAMOXIFEN CITRATE 10 MG PO TABS
20.0000 mg | ORAL_TABLET | Freq: Every day | ORAL | Status: DC
  Administered 2021-02-05 – 2021-02-06 (×2): 20 mg via ORAL
  Filled 2021-02-05 (×2): qty 2

## 2021-02-05 MED ORDER — ONDANSETRON HCL 4 MG/2ML IJ SOLN
4.0000 mg | Freq: Three times a day (TID) | INTRAMUSCULAR | Status: DC | PRN
  Administered 2021-02-05: 4 mg via INTRAVENOUS
  Filled 2021-02-05: qty 2

## 2021-02-05 MED ORDER — ENOXAPARIN SODIUM 40 MG/0.4ML IJ SOSY
40.0000 mg | PREFILLED_SYRINGE | INTRAMUSCULAR | Status: DC
  Administered 2021-02-05 – 2021-02-06 (×2): 40 mg via SUBCUTANEOUS
  Filled 2021-02-05 (×2): qty 0.4

## 2021-02-05 MED ORDER — SODIUM CHLORIDE 0.9 % IV SOLN: INTRAVENOUS | Status: DC

## 2021-02-05 MED ORDER — KETOROLAC TROMETHAMINE 30 MG/ML IJ SOLN
30.0000 mg | Freq: Once | INTRAMUSCULAR | Status: AC
  Administered 2021-02-05: 30 mg via INTRAVENOUS
  Filled 2021-02-05: qty 1

## 2021-02-05 NOTE — H&P (Signed)
History and Physical    Cassandra Tran HYW:[id number] DOB: Nov 21, 1970 DOA: 02/05/2021  Referring MD/NP/PA:   PCP: Kerri Perches, PA-C   Patient coming from:  The patient is coming from home.  At baseline, pt is independent for most of ADL.        Chief Complaint: Nausea, vomiting, diarrhea, abdominal pain  HPI: Cassandra Tran is a 50 y.o. female with medical history significant of hypertension, tobacco abuse, depression, breast cancer (s/p of bilateral mastectomy), who presents with nausea, vomiting, diarrhea and abdominal pain.  Patient states that she has been having nausea, vomiting, diarrhea and abdominal pain for more than 4 days.  She has nonbilious and nonbloody vomitings, 7 times since yesterday.  She has had at least 5 times of watery diarrhea since yesterday.  Her abdominal pain is diffuse, cramping, moderate, nonradiating.  She has chills, but no fever.  No symptoms of UTI.  Denies cough, chest pain, shortness of breath.  ED Course: pt was found to have WBC 11.2, lactic acid 0.7, negative pregnancy test, negative COVID PCR, negative urinalysis, AKI with creatinine 1.14, BUN 13 (baseline creatinine 1.08 on 05/17/2017).  Temperature normal, blood pressure 187/114, 100/69, heart rate 104, RR 22.  CT abdomen/pelvis showed enterocolitis.  Patient is admitted to [facility] bed as inpatient  Review of Systems:   General: no fevers, has chills, no body weight gain, has poor appetite, has fatigue HEENT: no blurry vision, hearing changes or sore throat Respiratory: no dyspnea, coughing, wheezing CV: no chest pain, no palpitations GI: has nausea, vomiting, abdominal pain, diarrhea, no constipation GU: no dysuria, burning on urination, increased urinary frequency, hematuria  Ext: no leg edema Neuro: no unilateral weakness, numbness, or tingling, no vision change or hearing loss Skin: no rash, no skin tear. MSK: No muscle spasm, no deformity, no limitation of range of movement in spin Heme: No  easy bruising.  Travel history: No recent long distant travel.  Allergy: No Known Allergies  Past Medical History:  Diagnosis Date  . Breast cancer ([place])   . HTN (hypertension)   . Tobacco abuse     Past Surgical History:  Procedure Laterality Date  . MASTECTOMY Bilateral     Social History:  reports that she has been smoking. She has never used smokeless tobacco. She reports current drug use. Drug: Marijuana. She reports that she does not drink alcohol.  Family History:  Family History  Problem Relation Age of Onset  . Sudden Cardiac Death Mother 38       at age 49  . Stroke Father 66     Prior to Admission medications   Medication Sig Start Date End Date Taking? Authorizing Provider  buPROPion (WELLBUTRIN XL) 300 MG 24 hr tablet Take 300 mg by mouth daily.   Yes [provider]  tamoxifen (NOLVADEX) 20 MG tablet Take 20 mg by mouth daily.   Yes [provider]  amLODipine (NORVASC) 5 MG tablet Take 1 tablet (5 mg total) by mouth daily. Patient not taking: No sig reported 04/16/17   Epifanio Lesches, MD  aspirin EC 81 MG EC tablet Take 1 tablet (81 mg total) by mouth daily. Patient not taking: No sig reported 04/16/17   Epifanio Lesches, MD    Physical Exam: Vitals:   02/04/21 2228 02/05/21 0357 02/05/21 0711 02/05/21 0932  BP:  (!) 187/114 100/69 129/87  Pulse:  (!) 104 65 82  Resp:  (!) 22 16 16   Temp:   98.6 F (37 C) 98  F (36.7 C)  TempSrc:   Oral Oral  SpO2:  99% 97% 100%  Weight: 63.5 kg     Height: 5\' 3"  (1.6 m)      General: Not in acute distress HEENT:       Eyes: PERRL, EOMI, no scleral icterus.       ENT: No discharge from the ears and nose, no pharynx injection, no tonsillar enlargement.        Neck: No JVD, no bruit, no mass felt. Heme: No neck lymph node enlargement. Cardiac: S1/S2, RRR, No murmurs, No gallops or rubs. Respiratory: No rales, wheezing, rhonchi or rubs. GI: Soft, nondistended, has diffuse tenderness, no  rebound pain, no organomegaly, BS present. GU: No hematuria Ext: No pitting leg edema bilaterally. 1+DP/PT pulse bilaterally. Musculoskeletal: No joint deformities, No joint redness or warmth, no limitation of ROM in spin. Skin: No rashes.  Neuro: Alert, oriented X3, cranial nerves II-XII grossly intact, moves all extremities normally. Psych: Patient is not psychotic, no suicidal or hemocidal ideation.  Labs on Admission: I have personally reviewed following labs and imaging studies  CBC: Recent Labs  Lab 02/04/21 2220  WBC 11.2*  HGB 12.5  HCT 39.8  MCV 88.2  PLT 123456   Basic Metabolic Panel: Recent Labs  Lab 02/04/21 2220  NA 137  K 4.2  CL 104  CO2 23  GLUCOSE 120*  BUN 13  CREATININE 1.14*  CALCIUM 9.3   GFR: Estimated Creatinine Clearance: 52.9 mL/min (A) (by C-G formula based on SCr of 1.14 mg/dL (H)). Liver Function Tests: Recent Labs  Lab 02/04/21 2220  AST 27  ALT 21  ALKPHOS 59  BILITOT 0.9  PROT 7.9  ALBUMIN 4.4   Recent Labs  Lab 02/04/21 2220  LIPASE 34   No results for input(s): AMMONIA in the last 168 hours. Coagulation Profile: No results for input(s): INR, PROTIME in the last 168 hours. Cardiac Enzymes: No results for input(s): CKTOTAL, CKMB, CKMBINDEX, TROPONINI in the last 168 hours. BNP (last 3 results) No results for input(s): PROBNP in the last 8760 hours. HbA1C: No results for input(s): HGBA1C in the last 72 hours. CBG: No results for input(s): GLUCAP in the last 168 hours. Lipid Profile: No results for input(s): CHOL, HDL, LDLCALC, TRIG, CHOLHDL, LDLDIRECT in the last 72 hours. Thyroid Function Tests: No results for input(s): TSH, T4TOTAL, FREET4, T3FREE, THYROIDAB in the last 72 hours. Anemia Panel: No results for input(s): VITAMINB12, FOLATE, FERRITIN, TIBC, IRON, RETICCTPCT in the last 72 hours. Urine analysis:    Component Value Date/Time   COLORURINE YELLOW (A) 02/05/2021 0204   APPEARANCEUR HAZY (A) 02/05/2021 0204    LABSPEC 1.019 02/05/2021 0204   PHURINE 6.0 02/05/2021 0204   GLUCOSEU NEGATIVE 02/05/2021 0204   HGBUR NEGATIVE 02/05/2021 0204   BILIRUBINUR NEGATIVE 02/05/2021 0204   KETONESUR 20 (A) 02/05/2021 0204   PROTEINUR NEGATIVE 02/05/2021 0204   NITRITE NEGATIVE 02/05/2021 0204   LEUKOCYTESUR NEGATIVE 02/05/2021 0204   Sepsis Labs: @LABRCNTIP (procalcitonin:4,lacticidven:4) ) Recent Results (from the past 240 hour(s))  Resp Panel by RT-PCR (Flu A&B, Covid) Nasopharyngeal Swab     Status: None   Collection Time: 02/05/21  5:52 AM   Specimen: Nasopharyngeal Swab; Nasopharyngeal(NP) swabs in vial transport medium  Result Value Ref Range Status   SARS Coronavirus 2 by RT PCR NEGATIVE NEGATIVE Final    Comment: (NOTE) SARS-CoV-2 target nucleic acids are NOT DETECTED.  The SARS-CoV-2 RNA is generally detectable in upper respiratory specimens during the acute  phase of infection. The lowest concentration of SARS-CoV-2 viral copies this assay can detect is 138 copies/mL. A negative result does not preclude SARS-Cov-2 infection and should not be used as the sole basis for treatment or other patient management decisions. A negative result may occur with  improper specimen collection/handling, submission of specimen other than nasopharyngeal swab, presence of viral mutation(s) within the areas targeted by this assay, and inadequate number of viral copies(<138 copies/mL). A negative result must be combined with clinical observations, patient history, and epidemiological information. The expected result is Negative.  Fact Sheet for Patients:  EntrepreneurPulse.com.au  Fact Sheet for Healthcare Providers:  IncredibleEmployment.be  This test is no t yet approved or cleared by the [place] FDA and  has been authorized for detection and/or diagnosis of SARS-CoV-2 by FDA under an Emergency Use Authorization (EUA). This EUA will remain  in effect (meaning  this test can be used) for the duration of the COVID-19 declaration under Section 564(b)(1) of the Act, 21 U.S.C.section 360bbb-3(b)(1), unless the authorization is terminated  or revoked sooner.       Influenza A by PCR NEGATIVE NEGATIVE Final   Influenza B by PCR NEGATIVE NEGATIVE Final    Comment: (NOTE) The Xpert Xpress SARS-CoV-2/FLU/RSV plus assay is intended as an aid in the diagnosis of influenza from Nasopharyngeal swab specimens and should not be used as a sole basis for treatment. Nasal washings and aspirates are unacceptable for Xpert Xpress SARS-CoV-2/FLU/RSV testing.  Fact Sheet for Patients: EntrepreneurPulse.com.au  Fact Sheet for Healthcare Providers: IncredibleEmployment.be  This test is not yet approved or cleared by the [place] FDA and has been authorized for detection and/or diagnosis of SARS-CoV-2 by FDA under an Emergency Use Authorization (EUA). This EUA will remain in effect (meaning this test can be used) for the duration of the COVID-19 declaration under Section 564(b)(1) of the Act, 21 U.S.C. section 360bbb-3(b)(1), unless the authorization is terminated or revoked.  Performed at [facility], [address] [place], [place] [address]      Radiological Exams on Admission: CT ABDOMEN PELVIS W CONTRAST  Result Date: 02/05/2021 CLINICAL DATA:  Acute nonlocalized abdominal pain EXAM: CT ABDOMEN AND PELVIS WITH CONTRAST TECHNIQUE: Multidetector CT imaging of the abdomen and pelvis was performed using the standard protocol following bolus administration of intravenous contrast. CONTRAST:  162mL OMNIPAQUE IOHEXOL 300 MG/ML  SOLN COMPARISON:  None. FINDINGS: Lower chest: Partially covered breast implants with symmetric inflation where seen. Hepatobiliary: No focal liver abnormality.No evidence of biliary obstruction or stone. Pancreas: Unremarkable. Spleen: Unremarkable. Adrenals/Urinary Tract: Negative  adrenals. No hydronephrosis or stone. Unremarkable bladder. Stomach/Bowel: Submucosal low-density thickening of the distal small bowel and proximal colon with associated mesenteric fat stranding and small volume reactive appearing ascites. There is a 4 day symptomatology suggesting infectious cause rather than inflammatory bowel disease. No bowel obstruction or perforation. Negative appendix. Mild left colonic diverticulosis. Vascular/Lymphatic: No acute vascular abnormality. No mass or adenopathy. Reproductive:No pathologic findings. Other: Small volume reactive ascites.  No pneumoperitoneum. Musculoskeletal: No acute abnormalities. Spondylosis. No sacroiliitis seen. IMPRESSION: Enterocolitis. Electronically Signed   By: Monte Fantasia M.D.   On: 02/05/2021 04:23     EKG: I have personally reviewed.  Sinus rhythm, QTC 461, low voltage, J-point elevation with ST elevation in precordial leads which is old and seen in previous EKG on 04/14/2017.  Assessment/Plan Principal Problem:   Enterocolitis Active Problems:   Sepsis ([place])   Depression   Tobacco abuse   AKI (acute kidney  injury) ([place])   HTN (hypertension)   Breast cancer ([place])   Sepsis due to enterocolitis: Patient meets criteria for sepsis with tachycardia with heart rate of 104, tachypnea with RR 22.  Has mild leukocytosis.  Lactic acid is normal.  Currently blood pressure is soft, but hemodynamically stable.  -will admit to med-surg bed as inpt -IV zosyn -f/u blood culture, C. difficile, GI pathogen panel -IV fluid: 2 L normal saline, then 125 cc/h -will get Procalcitonin  Depression -BuSpar  Tobacco abuse -Nicotine patch  AKI (acute kidney injury) ([place]): Mild, creatinine 1.14, BUN 13, GFR 59, likely due to dehydration -IV fluid as above  HTN (hypertension): Blood pressure 187/114 --> 100/69.  Patient used to be on amlodipine, currently not taking this medication. -Will not restart amlodipine due to sepsis and high risk for  developing hypotension -IV hydralazine.  Breast cancer [facility]): S/p of bilateral mastectomy -Continue tamoxifen    DVT ppx: SQ Lovenox Code Status: Full code Family Communication: not done, no family member is at bed side. Disposition Plan:  Anticipate discharge back to previous environment Consults called:  none Admission status and Level of care: Med-Surg:    as inpt       Status is: Inpatient  Remains inpatient appropriate because:Inpatient level of care appropriate due to severity of illness   Dispo: The patient is from: Home              Anticipated d/c is to: Home              Patient currently is medically stable to d/c.   Difficult to place patient No          Date of Service 02/05/2021    Ivor Costa [facility]   If 7PM-7AM, please contact night-coverage [email or web address] 02/05/2021, 11:54 AM

## 2021-02-05 NOTE — ED Notes (Signed)
Patient transported to CT 

## 2021-02-05 NOTE — Consult Note (Signed)
Pharmacy Antibiotic Note  Cassandra Tran is a 50 y.o. female admitted on 02/05/2021 with enterocolitis.  Pharmacy has been consulted for Zosyn dosing.  Plan: Zosyn 3.375g IV q8h (4 hour infusion).  Height: 5\' 3"  (160 cm) Weight: 63.5 kg (140 lb) IBW/kg (Calculated) : 52.4  Temp (24hrs), Avg:98.5 F (36.9 C), Min:98.3 F (36.8 C), Max:98.6 F (37 C)  Recent Labs  Lab 02/04/21 2220 02/05/21 0651  WBC 11.2*  --   CREATININE 1.14*  --   LATICACIDVEN  --  0.7    Estimated Creatinine Clearance: 52.9 mL/min (A) (by C-G formula based on SCr of 1.14 mg/dL (H)).    No Known Allergies  Antimicrobials this admission: Zosyn 5/6  >>   Microbiology results: 5/6 BCx: pending 5/6 GI panel: pending 5/6 C.diff quick scan: pending  Thank you for allowing pharmacy to be a part of this patient's care.  Drezden Seitzinger A Marvin Grabill 02/05/2021 7:55 AM

## 2021-02-05 NOTE — ED Notes (Signed)
Pt ambulated to bathroom with assistance. Pt stated she had a loose stool. She returned to stretcher safely and is resting.

## 2021-02-05 NOTE — ED Provider Notes (Signed)
[facility] Emergency Department Provider Note  ____________________________________________   Event Date/Time   First MD Initiated Contact with Patient 02/05/21 0114     (approximate)  I have reviewed the triage vital signs and the nursing notes.   HISTORY  Chief Complaint Abdominal Pain    HPI Cassandra Tran is a 50 y.o. female with history of hypertension who presents to the emergency department with diffuse crampy abdominal pain, nausea, vomiting and diarrhea for 4 days.  Has had chills but no documented fevers.  No sick contacts or recent travel.  No previous abdominal surgeries.  Denies dysuria, hematuria, vaginal bleeding or discharge.        History reviewed. No pertinent past medical history.  Patient Active Problem List   Diagnosis Date Noted  . Hypertensive emergency 04/14/2017    History reviewed. No pertinent surgical history.  Prior to Admission medications   Medication Sig Start Date End Date Taking? Authorizing Provider  amLODipine (NORVASC) 5 MG tablet Take 1 tablet (5 mg total) by mouth daily. 04/16/17   Epifanio Lesches, MD  aspirin EC 81 MG EC tablet Take 1 tablet (81 mg total) by mouth daily. 04/16/17   Epifanio Lesches, MD  carvedilol (COREG) 6.25 MG tablet Take 1 tablet (6.25 mg total) by mouth 2 (two) times daily. 04/16/17 04/16/18  Epifanio Lesches, MD    Allergies Patient has no known allergies.  Family History  Problem Relation Age of Onset  . Sudden Cardiac Death Mother 46       at age 22  . Stroke Father 71    Social History Social History   Tobacco Use  . Smoking status: Current Some Day Smoker  . Smokeless tobacco: Never Used  Substance Use Topics  . Alcohol use: No  . Drug use: Yes    Types: Marijuana    Review of Systems Constitutional: No fever. Eyes: No visual changes. ENT: No sore throat. Cardiovascular: Denies chest pain. Respiratory: Denies shortness of breath. Gastrointestinal:  + nausea, vomiting, diarrhea. Genitourinary: Negative for dysuria. Musculoskeletal: Negative for back pain. Skin: Negative for rash. Neurological: Negative for focal weakness or numbness.  ____________________________________________   PHYSICAL EXAM:  VITAL SIGNS: ED Triage Vitals  Enc Vitals Group     BP 02/04/21 2218 (!) 163/101     Pulse Rate 02/04/21 2218 78     Resp 02/04/21 2218 20     Temp 02/04/21 2218 98.3 F (36.8 C)     Temp Source 02/04/21 2218 Oral     SpO2 02/04/21 2213 100 %     Weight 02/04/21 2228 140 lb (63.5 kg)     Height 02/04/21 2228 5\' 3"  (1.6 m)     Head Circumference --      Peak Flow --      Pain Score 02/04/21 2227 10     Pain Loc --      Pain Edu? --      Excl. in [place]? --    CONSTITUTIONAL: Alert and oriented and responds appropriately to questions.  Appears uncomfortable but is nontoxic, afebrile, moaning in pain HEAD: Normocephalic EYES: Conjunctivae clear, pupils appear equal, EOM appear intact ENT: normal nose; dry mucous membranes NECK: Supple, normal ROM CARD: Regular and minimally tachycardic; S1 and S2 appreciated; no murmurs, no clicks, no rubs, no gallops RESP: Normal chest excursion without splinting or tachypnea; breath sounds clear and equal bilaterally; no wheezes, no rhonchi, no rales, no hypoxia or respiratory distress, speaking full sentences ABD/GI: Normal bowel  sounds; non-distended; soft, diffusely tender to palpation throughout the abdomen with intermittent guarding, tender at McBurney's point with guarding, no rebound BACK: The back appears normal EXT: Normal ROM in all joints; no deformity noted, no edema; no cyanosis SKIN: Normal color for age and race; warm; no rash on exposed skin NEURO: Moves all extremities equally PSYCH: The patient's mood and manner are appropriate.  ____________________________________________   LABS (all labs ordered are listed, but only abnormal results are displayed)  Labs Reviewed   COMPREHENSIVE METABOLIC PANEL - Abnormal; Notable for the following components:      Result Value   Glucose, Bld 120 (*)    Creatinine, Ser 1.14 (*)    GFR, Estimated 59 (*)    All other components within normal limits  CBC - Abnormal; Notable for the following components:   WBC 11.2 (*)    All other components within normal limits  URINALYSIS, COMPLETE (UACMP) WITH MICROSCOPIC - Abnormal; Notable for the following components:   Color, Urine YELLOW (*)    APPearance HAZY (*)    Ketones, ur 20 (*)    All other components within normal limits  RESP PANEL BY RT-PCR (FLU A&B, COVID) [id number]  GASTROINTESTINAL PANEL BY PCR, STOOL (REPLACES STOOL CULTURE)  C DIFFICILE QUICK SCREEN W PCR REFLEX  LIPASE, BLOOD  HCG, QUANTITATIVE, PREGNANCY  POC URINE PREG, ED   ____________________________________________  EKG   ____________________________________________  RADIOLOGY I, Shasha Buchbinder, personally viewed and evaluated these images (plain radiographs) as part of my medical decision making, as well as reviewing the written report by the radiologist.  ED MD interpretation: Diffuse colitis.  Official radiology report(s): CT ABDOMEN PELVIS W CONTRAST  Result Date: 02/05/2021 CLINICAL DATA:  Acute nonlocalized abdominal pain EXAM: CT ABDOMEN AND PELVIS WITH CONTRAST TECHNIQUE: Multidetector CT imaging of the abdomen and pelvis was performed using the standard protocol following bolus administration of intravenous contrast. CONTRAST:  177mL OMNIPAQUE IOHEXOL 300 MG/ML  SOLN COMPARISON:  None. FINDINGS: Lower chest: Partially covered breast implants with symmetric inflation where seen. Hepatobiliary: No focal liver abnormality.No evidence of biliary obstruction or stone. Pancreas: Unremarkable. Spleen: Unremarkable. Adrenals/Urinary Tract: Negative adrenals. No hydronephrosis or stone. Unremarkable bladder. Stomach/Bowel: Submucosal low-density thickening of the distal small bowel and proximal  colon with associated mesenteric fat stranding and small volume reactive appearing ascites. There is a 4 day symptomatology suggesting infectious cause rather than inflammatory bowel disease. No bowel obstruction or perforation. Negative appendix. Mild left colonic diverticulosis. Vascular/Lymphatic: No acute vascular abnormality. No mass or adenopathy. Reproductive:No pathologic findings. Other: Small volume reactive ascites.  No pneumoperitoneum. Musculoskeletal: No acute abnormalities. Spondylosis. No sacroiliitis seen. IMPRESSION: Enterocolitis. Electronically Signed   By: Monte Fantasia M.D.   On: 02/05/2021 04:23    ____________________________________________   PROCEDURES  Procedure(s) performed (including Critical Care):  Procedures    ____________________________________________   INITIAL IMPRESSION / ASSESSMENT AND PLAN / ED COURSE  As part of my medical decision making, I reviewed the following data within the [facility] notes reviewed and incorporated, Labs reviewed , Old chart reviewed, Radiograph reviewed , Discussed with admitting physician  and Notes from prior ED visits         Patient here with abdominal pain, nausea, vomiting and diarrhea.  Differential includes viral gastroenteritis, colitis, diverticulitis, bowel obstruction, appendicitis, UTI, pyelonephritis.  LFTs, lipase reassuring.  Creatinine minimally elevated.  Minimal leukocytosis.  Will give IV fluids, Zofran, Toradol for symptomatic relief.  Will obtain CT of the abdomen pelvis,  urinalysis.  ED PROGRESS  Patient reports she continues to be very uncomfortable, tachycardic, hypertensive and is moaning in pain.  CT scan shows diffuse enterocolitis that is likely infectious in nature.  Will give IV Zosyn.  Will give morphine and reassess.   5:00 AM  Pt reports feeling better but does not feel comfortable going home and is requesting admission which I feel is reasonable.  We will  send stool studies.  Will discuss with hospitalist for admission for continued IV hydration, antibiotics and pain medication.  5:50 AM Discussed patient's case with hospitalist, Dr. Sidney Ace.  I have recommended admission and patient (and family if present) agree with this plan. Admitting physician will place admission orders.   I reviewed all nursing notes, vitals, pertinent previous records and reviewed/interpreted all EKGs, lab and urine results, imaging (as available).  ____________________________________________   FINAL CLINICAL IMPRESSION(S) / ED DIAGNOSES  Final diagnoses:  Nausea vomiting and diarrhea  Colitis     ED Discharge Orders    None      *Please note:  Cassandra Tran was evaluated in Emergency Department on 02/05/2021 for the symptoms described in the history of present illness. She was evaluated in the context of the global COVID-19 pandemic, which necessitated consideration that the patient might be at risk for infection with the SARS-CoV-2 virus that causes COVID-19. Institutional protocols and algorithms that pertain to the evaluation of patients at risk for COVID-19 are in a state of rapid change based on information released by regulatory bodies including the CDC and federal and state organizations. These policies and algorithms were followed during the patient's care in the ED.  Some ED evaluations and interventions may be delayed as a result of limited staffing during and the pandemic.*   Note:  This document was prepared using Dragon voice recognition software and may include unintentional dictation errors.   Graycee Greeson, Delice Bison, DO 02/05/21 [phone]

## 2021-02-06 DIAGNOSIS — A040 Enteropathogenic Escherichia coli infection: Secondary | ICD-10-CM

## 2021-02-06 DIAGNOSIS — R112 Nausea with vomiting, unspecified: Secondary | ICD-10-CM

## 2021-02-06 LAB — GASTROINTESTINAL PANEL BY PCR, STOOL (REPLACES STOOL CULTURE)

## 2021-02-06 LAB — C DIFFICILE QUICK SCREEN W PCR REFLEX
C Diff antigen: NEGATIVE
C Diff interpretation: NOT DETECTED
C Diff toxin: NEGATIVE

## 2021-02-06 LAB — CBC
HCT: 32.4 % — ABNORMAL LOW (ref 36.0–46.0)
Hemoglobin: 10.4 g/dL — ABNORMAL LOW (ref 12.0–15.0)
MCH: 28 pg (ref 26.0–34.0)
MCHC: 32.1 g/dL (ref 30.0–36.0)
MCV: 87.1 fL (ref 80.0–100.0)
Platelets: 208 10*3/uL (ref 150–400)
RBC: 3.72 MIL/uL — ABNORMAL LOW (ref 3.87–5.11)
RDW: 14.3 % (ref 11.5–15.5)
WBC: 4.4 10*3/uL (ref 4.0–10.5)
nRBC: 0 % (ref 0.0–0.2)

## 2021-02-06 LAB — BASIC METABOLIC PANEL
Anion gap: 4 — ABNORMAL LOW (ref 5–15)
BUN: 9 mg/dL (ref 6–20)
CO2: 28 mmol/L (ref 22–32)
Calcium: 8.5 mg/dL — ABNORMAL LOW (ref 8.9–10.3)
Chloride: 108 mmol/L (ref 98–111)
Creatinine, Ser: 1.24 mg/dL — ABNORMAL HIGH (ref 0.44–1.00)
GFR, Estimated: 53 mL/min — ABNORMAL LOW (ref >60)
Glucose, Bld: 102 mg/dL — ABNORMAL HIGH (ref 70–99)
Potassium: 3.8 mmol/L (ref 3.5–5.1)
Sodium: 140 mmol/L (ref 135–145)

## 2021-02-06 LAB — GLUCOSE, CAPILLARY: Glucose-Capillary: 88 mg/dL (ref 70–99)

## 2021-02-06 MED ORDER — CIPROFLOXACIN HCL 500 MG PO TABS
500.0000 mg | ORAL_TABLET | Freq: Two times a day (BID) | ORAL | Status: DC
  Administered 2021-02-06: 500 mg via ORAL
  Filled 2021-02-06: qty 1

## 2021-02-06 MED ORDER — CIPROFLOXACIN HCL 500 MG PO TABS: 500.0000 mg | ORAL_TABLET | Freq: Two times a day (BID) | ORAL | 0 refills | Status: AC

## 2021-02-06 MED ORDER — AMLODIPINE BESYLATE 5 MG PO TABS: 10.0000 mg | ORAL_TABLET | Freq: Every day | ORAL | 0 refills | Status: DC

## 2021-02-06 NOTE — Discharge Summary (Signed)
[facility] [place] NAME: Cassandra Tran    MR#:  [id number]  DATE OF BIRTH:  [birth year]  DATE OF ADMISSION:  02/05/2021 ADMITTING PHYSICIAN: Ivor Costa, MD  DATE OF DISCHARGE: 02/06/2021  PRIMARY CARE PHYSICIAN: Kerri Perches, PA-C    ADMISSION DIAGNOSIS:  Enterocolitis [K52.9] Colitis [K52.9] Nausea vomiting and diarrhea [R11.2, R19.7]  DISCHARGE DIAGNOSIS:   E. coli enterocolitis  SECONDARY DIAGNOSIS:   Past Medical History:  Diagnosis Date  . Breast cancer ([place])   . HTN (hypertension)   . Tobacco abuse     HOSPITAL COURSE:  Cassandra Tran is a 50 y.o. female with medical history significant of hypertension, tobacco abuse, depression, breast cancer (s/p of bilateral mastectomy), who presents with nausea, vomiting, diarrhea and abdominal pain. Patient states that she has been having nausea, vomiting, diarrhea and abdominal pain for more than 4 days  Sepsis due to enterocolitis: Patient meets criteria for sepsis with tachycardia with heart rate of 104, tachypnea with RR 22.  Has mild leukocytosis.  Lactic acid is normal.  Currently blood pressure is soft, but hemodynamically stable. -- Sepsis resolved -IV zosyn-- change to oral Cipro BID for seven days - blood culture negative, C. difficile negative, GI pathogen panel positive for EPEC (e.coli) -received IV fluids. Patient tolerating PO liquid diet. Recommended her to continue liquid and full liquid diet. Continue to hydrate. She is requesting to go home to be with her family. Patient advised to return the emergency room if sign symptoms get worse. She voiced understanding.  Depression -BuSpar  Tobacco abuse -Nicotine patch  AKI (acute kidney injury) ([place]): Mild, creatinine 1.14, BUN 13, GFR 59, likely due to dehydration -IV fluid as above  HTN (hypertension):  -- patient recommended to resume her amlodipine 10 mg daily.  Breast cancer [facility]): S/p of bilateral  mastectomy --Continue tamoxifen  Overall improving. Pt requesting to go home to be with her family. Disccused d/c plans as above   CONSULTS OBTAINED:    DRUG ALLERGIES:  No Known Allergies  DISCHARGE MEDICATIONS:   Allergies as of 02/06/2021   No Known Allergies     Medication List    STOP taking these medications   aspirin 81 MG EC tablet     TAKE these medications   amLODipine 5 MG tablet Commonly known as: NORVASC Take 2 tablets (10 mg total) by mouth daily. What changed: how much to take   buPROPion 300 MG 24 hr tablet Commonly known as: WELLBUTRIN XL Take 300 mg by mouth daily.   ciprofloxacin 500 MG tablet Commonly known as: CIPRO Take 1 tablet (500 mg total) by mouth 2 (two) times daily for 7 days.   tamoxifen 20 MG tablet Commonly known as: NOLVADEX Take 20 mg by mouth daily.       If you experience worsening of your admission symptoms, develop shortness of breath, life threatening emergency, suicidal or homicidal thoughts you must seek medical attention immediately by calling 911 or calling your MD immediately  if symptoms less severe.  You Must read complete instructions/literature along with all the possible adverse reactions/side effects for all the Medicines you take and that have been prescribed to you. Take any new Medicines after you have completely understood and accept all the possible adverse reactions/side effects.   Please note  You were cared for by a hospitalist during your hospital stay. If you have any questions about your discharge medications or the care you received while you were in  the hospital after you are discharged, you can call the unit and asked to speak with the hospitalist on call if the hospitalist that took care of you is not available. Once you are discharged, your primary care physician will handle any further medical issues. Please note that NO REFILLS for any discharge medications will be authorized once you are discharged,  as it is imperative that you return to your primary care physician (or establish a relationship with a primary care physician if you do not have one) for your aftercare needs so that they can reassess your need for medications and monitor your lab values. Today   SUBJECTIVE   Patient not vomiting. Has liquidy stool have decreased in frequency. She is requesting she wants to go home  tolerating clear liquid diet  VITAL SIGNS:  Blood pressure (!) 140/106, pulse 64, temperature 98.4 F (36.9 C), resp. rate 16, height 5\' 3"  (1.6 m), weight 63.5 kg, SpO2 100 %.  I/O:    Intake/Output Summary (Last 24 hours) at 02/06/2021 1323 Last data filed at 02/05/2021 1802 Gross per 24 hour  Intake 40.59 ml  Output --  Net 40.59 ml    PHYSICAL EXAMINATION:  GENERAL:  50 y.o.-year-old patient lying in the bed with no acute distress.  LUNGS: Normal breath sounds bilaterally, no wheezing, rales,rhonchi or crepitation. No use of accessory muscles of respiration.  CARDIOVASCULAR: S1, S2 normal. No murmurs, rubs, or gallops.  ABDOMEN: Soft, non-tender, non-distended. Bowel sounds present. No organomegaly or mass.  EXTREMITIES: No pedal edema, cyanosis, or clubbing.  NEUROLOGIC: Cranial nerves II through XII are intact. Muscle strength 5/5 in all extremities. Sensation intact. Gait not checked.  PSYCHIATRIC: The patient is alert and oriented x 3.  SKIN: No obvious rash, lesion, or ulcer.   DATA REVIEW:   CBC  Recent Labs  Lab 02/06/21 0438  WBC 4.4  HGB 10.4*  HCT 32.4*  PLT 208    Chemistries  Recent Labs  Lab 02/04/21 2220 02/06/21 0438  NA 137 140  K 4.2 3.8  CL 104 108  CO2 23 28  GLUCOSE 120* 102*  BUN 13 9  CREATININE 1.14* 1.24*  CALCIUM 9.3 8.5*  AST 27  --   ALT 21  --   ALKPHOS 59  --   BILITOT 0.9  --     Microbiology Results   Recent Results (from the past 240 hour(s))  Gastrointestinal Panel by PCR , Stool     Status: Abnormal   Collection Time: 02/05/21  5:01 AM    Specimen: Stool  Result Value Ref Range Status   Campylobacter species NOT DETECTED NOT DETECTED Final   Plesimonas shigelloides NOT DETECTED NOT DETECTED Final   Salmonella species NOT DETECTED NOT DETECTED Final   Yersinia enterocolitica NOT DETECTED NOT DETECTED Final   Vibrio species NOT DETECTED NOT DETECTED Final   Vibrio cholerae NOT DETECTED NOT DETECTED Final   Enteroaggregative E coli (EAEC) NOT DETECTED NOT DETECTED Final   Enteropathogenic E coli (EPEC) DETECTED (A) NOT DETECTED Final    Comment: RESULT CALLED TO, READ BACK BY AND VERIFIED WITH: DEWAND SONGSTER AT 0123 02/06/21 MF    Enterotoxigenic E coli (ETEC) NOT DETECTED NOT DETECTED Final   Shiga like toxin producing E coli (STEC) NOT DETECTED NOT DETECTED Final   Shigella/Enteroinvasive E coli (EIEC) NOT DETECTED NOT DETECTED Final   Cryptosporidium NOT DETECTED NOT DETECTED Final   Cyclospora cayetanensis NOT DETECTED NOT DETECTED Final   Entamoeba histolytica NOT DETECTED  NOT DETECTED Final   Giardia lamblia NOT DETECTED NOT DETECTED Final   Adenovirus F40/41 NOT DETECTED NOT DETECTED Final   Astrovirus NOT DETECTED NOT DETECTED Final   Norovirus GI/GII NOT DETECTED NOT DETECTED Final   Rotavirus A NOT DETECTED NOT DETECTED Final   Sapovirus (I, II, IV, and V) NOT DETECTED NOT DETECTED Final    Comment: Performed at [facility], [address] [place], [place] [address]  C Difficile Quick Screen w PCR reflex     Status: None   Collection Time: 02/05/21  5:01 AM   Specimen: STOOL  Result Value Ref Range Status   C Diff antigen NEGATIVE NEGATIVE Final   C Diff toxin NEGATIVE NEGATIVE Final   C Diff interpretation No C. difficile detected.  Final    Comment: Performed at [facility], [address] [place], [place] [address]  Resp Panel by RT-PCR (Flu A&B, Covid) Nasopharyngeal Swab     Status: None   Collection Time: 02/05/21  5:52 AM   Specimen: Nasopharyngeal Swab; Nasopharyngeal(NP)  swabs in vial transport medium  Result Value Ref Range Status   SARS Coronavirus 2 by RT PCR NEGATIVE NEGATIVE Final    Comment: (NOTE) SARS-CoV-2 target nucleic acids are NOT DETECTED.  The SARS-CoV-2 RNA is generally detectable in upper respiratory specimens during the acute phase of infection. The lowest concentration of SARS-CoV-2 viral copies this assay can detect is 138 copies/mL. A negative result does not preclude SARS-Cov-2 infection and should not be used as the sole basis for treatment or other patient management decisions. A negative result may occur with  improper specimen collection/handling, submission of specimen other than nasopharyngeal swab, presence of viral mutation(s) within the areas targeted by this assay, and inadequate number of viral copies(<138 copies/mL). A negative result must be combined with clinical observations, patient history, and epidemiological information. The expected result is Negative.  Fact Sheet for Patients:  EntrepreneurPulse.com.au  Fact Sheet for Healthcare Providers:  IncredibleEmployment.be  This test is no t yet approved or cleared by the [place] FDA and  has been authorized for detection and/or diagnosis of SARS-CoV-2 by FDA under an Emergency Use Authorization (EUA). This EUA will remain  in effect (meaning this test can be used) for the duration of the COVID-19 declaration under Section 564(b)(1) of the Act, 21 U.S.C.section 360bbb-3(b)(1), unless the authorization is terminated  or revoked sooner.       Influenza A by PCR NEGATIVE NEGATIVE Final   Influenza B by PCR NEGATIVE NEGATIVE Final    Comment: (NOTE) The Xpert Xpress SARS-CoV-2/FLU/RSV plus assay is intended as an aid in the diagnosis of influenza from Nasopharyngeal swab specimens and should not be used as a sole basis for treatment. Nasal washings and aspirates are unacceptable for Xpert Xpress  SARS-CoV-2/FLU/RSV testing.  Fact Sheet for Patients: EntrepreneurPulse.com.au  Fact Sheet for Healthcare Providers: IncredibleEmployment.be  This test is not yet approved or cleared by the [place] FDA and has been authorized for detection and/or diagnosis of SARS-CoV-2 by FDA under an Emergency Use Authorization (EUA). This EUA will remain in effect (meaning this test can be used) for the duration of the COVID-19 declaration under Section 564(b)(1) of the Act, 21 U.S.C. section 360bbb-3(b)(1), unless the authorization is terminated or revoked.  Performed at [facility], [place], [place] [address]   CULTURE, BLOOD (ROUTINE X 2) w Reflex to ID Panel     Status: None (Preliminary result)   Collection Time:  02/05/21  9:05 AM   Specimen: BLOOD  Result Value Ref Range Status   Specimen Description BLOOD Blood Culture adequate volume  Final   Special Requests   Final    BOTTLES DRAWN AEROBIC AND ANAEROBIC LEFT ANTECUBITAL   Culture   Final    NO GROWTH < 24 HOURS Performed at [facility], [address], [place], [place] [address]    Report Status PENDING  Incomplete  CULTURE, BLOOD (ROUTINE X 2) w Reflex to ID Panel     Status: None (Preliminary result)   Collection Time: 02/05/21  9:05 AM   Specimen: BLOOD  Result Value Ref Range Status   Specimen Description BLOOD Blood Culture adequate volume  Final   Special Requests   Final    BOTTLES DRAWN AEROBIC AND ANAEROBIC BLOOD RIGHT HAND   Culture   Final    NO GROWTH < 24 HOURS Performed at [facility], [address], [place], [place] [address]    Report Status PENDING  Incomplete    RADIOLOGY:  CT ABDOMEN PELVIS W CONTRAST  Result Date: 02/05/2021 CLINICAL DATA:  Acute nonlocalized abdominal pain EXAM: CT ABDOMEN AND PELVIS WITH CONTRAST TECHNIQUE: Multidetector CT imaging of the abdomen and pelvis was performed using the standard  protocol following bolus administration of intravenous contrast. CONTRAST:  160mL OMNIPAQUE IOHEXOL 300 MG/ML  SOLN COMPARISON:  None. FINDINGS: Lower chest: Partially covered breast implants with symmetric inflation where seen. Hepatobiliary: No focal liver abnormality.No evidence of biliary obstruction or stone. Pancreas: Unremarkable. Spleen: Unremarkable. Adrenals/Urinary Tract: Negative adrenals. No hydronephrosis or stone. Unremarkable bladder. Stomach/Bowel: Submucosal low-density thickening of the distal small bowel and proximal colon with associated mesenteric fat stranding and small volume reactive appearing ascites. There is a 4 day symptomatology suggesting infectious cause rather than inflammatory bowel disease. No bowel obstruction or perforation. Negative appendix. Mild left colonic diverticulosis. Vascular/Lymphatic: No acute vascular abnormality. No mass or adenopathy. Reproductive:No pathologic findings. Other: Small volume reactive ascites.  No pneumoperitoneum. Musculoskeletal: No acute abnormalities. Spondylosis. No sacroiliitis seen. IMPRESSION: Enterocolitis. Electronically Signed   By: Monte Fantasia M.D.   On: 02/05/2021 04:23     CODE STATUS:     Code Status Orders  (From admission, onward)         Start     Ordered   02/05/21 0912  Full code  Continuous        02/05/21 0911        Code Status History    Date Active Date Inactive Code Status Order ID Comments User Context   04/14/2017 1831 04/16/2017 1454 Full Code [id number]  Epifanio Lesches, MD ED   Advance Care Planning Activity       TOTAL TIME TAKING CARE OF THIS PATIENT:35* minutes.    Fritzi Mandes M.D  [facility]    CC: Primary care physician; Kerri Perches, PA-C

## 2021-02-06 NOTE — Progress Notes (Signed)
Pt's PIV removed with tip intact. Discharge instructions explained with pt. Pt denies any questions or concerns. Pt to pick up meds from home pharmacy.

## 2021-02-06 NOTE — Discharge Instructions (Addendum)
Patient advised to continue liquid and full liquid diet for next few days. Advance as tolerated. Keep adequately hydrated at home with water and Gatorade like drinks.

## 2021-02-06 NOTE — Progress Notes (Signed)
Sharion Settler RN notified stool specimen results Enteropathogenic E Coli, no orders given

## 2021-02-10 LAB — CULTURE, BLOOD (ROUTINE X 2)
Culture: NO GROWTH
Culture: NO GROWTH
Specimen Description: ADEQUATE
Specimen Description: ADEQUATE

## 2021-03-04 ENCOUNTER — Ambulatory Visit: Payer: 59
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
## 2021-04-16 ENCOUNTER — Telehealth: Payer: Self-pay

## 2021-04-16 NOTE — Telephone Encounter (Signed)
Pt has an appt 7/20

## 2021-04-16 NOTE — Telephone Encounter (Signed)
Copied from [employer] [id number]. Topic: Appointment Scheduling - Scheduling Inquiry for Clinic >> Apr 16, 2021  8:35 AM Valere Dross wrote: Reason for CRM: Pt called in about making an appt for a colonoscopy, pt states she wanted a liquid one, and not the box. Pt also states she still has the previous box for the last one she did not use and wanted to know what to do with it. Please advise.

## 2021-04-16 NOTE — Telephone Encounter (Signed)
Patient is overdue for follow up. Please make her an appointment for her blood pressure check and we can discuss it at that time.

## 2021-04-21 ENCOUNTER — Other Ambulatory Visit: Payer: Self-pay

## 2021-04-21 ENCOUNTER — Ambulatory Visit (INDEPENDENT_AMBULATORY_CARE_PROVIDER_SITE_OTHER): Payer: Managed Care, Other (non HMO) | Admitting: Nurse Practitioner

## 2021-04-21 ENCOUNTER — Encounter: Payer: Self-pay | Admitting: Nurse Practitioner

## 2021-04-21 ENCOUNTER — Ambulatory Visit: Payer: 59 | Admitting: Nurse Practitioner

## 2021-04-21 VITALS — BP 142/93 | HR 66 | Temp 98.7°F | Wt 150.0 lb

## 2021-04-21 DIAGNOSIS — R7989 Other specified abnormal findings of blood chemistry: Secondary | ICD-10-CM

## 2021-04-21 DIAGNOSIS — Z1211 Encounter for screening for malignant neoplasm of colon: Secondary | ICD-10-CM

## 2021-04-21 DIAGNOSIS — I10 Essential (primary) hypertension: Secondary | ICD-10-CM

## 2021-04-21 MED ORDER — BUPROPION HCL ER (XL) 300 MG PO TB24: 300.0000 mg | ORAL_TABLET | Freq: Every day | ORAL | 1 refills | Status: DC

## 2021-04-21 NOTE — Assessment & Plan Note (Signed)
Chronic.  Elevated at visit but well controlled when patient checks blood pressures at home.  Recommend continuing to check blood pressures. Labs ordered today. Follow up in 6 months for reevaluation.

## 2021-04-21 NOTE — Progress Notes (Signed)
BP (!) 142/93   Pulse 66   Temp 98.7 F (37.1 C)   Wt 150 lb (68 kg)   SpO2 99%   BMI 25.75 kg/m    Subjective:    Patient ID: Cassandra Tran, female    DOB: 08-12-1971, 50 y.o.   MRN: [id number]  HPI: Cassandra Tran is a 50 y.o. female  Chief Complaint  Patient presents with   Medication Refill    wellbutrin   HYPERTENSION Hypertension status: controlled  Satisfied with current treatment? yes Duration of hypertension: years BP monitoring frequency:  weekly BP range: 130/82 BP medication side effects:  no Medication compliance: excellent compliance Previous BP meds:amlodipine Aspirin: no Recurrent headaches: no Visual changes: no Palpitations: no Dyspnea: no Chest pain: no Lower extremity edema: no Dizzy/lightheaded: no  Patient states she is taking the Wellbutrin for diet control.  It helps to control her cravings.   Relevant past medical, surgical, family and social history reviewed and updated as indicated. Interim medical history since our last visit reviewed. Allergies and medications reviewed and updated.  Review of Systems  Eyes:  Negative for visual disturbance.  Respiratory:  Negative for cough, chest tightness and shortness of breath.   Cardiovascular:  Negative for chest pain, palpitations and leg swelling.  Neurological:  Negative for dizziness and headaches.   Per HPI unless specifically indicated above     Objective:    BP (!) 142/93   Pulse 66   Temp 98.7 F (37.1 C)   Wt 150 lb (68 kg)   SpO2 99%   BMI 25.75 kg/m   Wt Readings from Last 3 Encounters:  04/21/21 150 lb (68 kg)  12/02/20 159 lb 8 oz (72.3 kg)  11/23/20 155 lb (70.3 kg)    Physical Exam Vitals and nursing note reviewed.  Constitutional:      General: She is not in acute distress.    Appearance: Normal appearance. She is normal weight. She is not ill-appearing, toxic-appearing or diaphoretic.  HENT:     Head: Normocephalic.     Right Ear: External ear normal.      Left Ear: External ear normal.     Nose: Nose normal.     Mouth/Throat:     Mouth: Mucous membranes are moist.     Pharynx: Oropharynx is clear.  Eyes:     General:        Right eye: No discharge.        Left eye: No discharge.     Extraocular Movements: Extraocular movements intact.     Conjunctiva/sclera: Conjunctivae normal.     Pupils: Pupils are equal, round, and reactive to light.  Cardiovascular:     Rate and Rhythm: Normal rate and regular rhythm.     Heart sounds: No murmur heard. Pulmonary:     Effort: Pulmonary effort is normal. No respiratory distress.     Breath sounds: Normal breath sounds. No wheezing or rales.  Musculoskeletal:     Cervical back: Normal range of motion and neck supple.  Skin:    General: Skin is warm and dry.     Capillary Refill: Capillary refill takes less than 2 seconds.  Neurological:     General: No focal deficit present.     Mental Status: She is alert and oriented to person, place, and time. Mental status is at baseline.  Psychiatric:        Mood and Affect: Mood normal.        Behavior: Behavior normal.  Thought Content: Thought content normal.        Judgment: Judgment normal.    Results for orders placed or performed during the hospital encounter of 41/66/06  Basic metabolic panel  Result Value Ref Range   Sodium 138 135 - 145 mmol/L   Potassium 4.1 3.5 - 5.1 mmol/L   Chloride 105 98 - 111 mmol/L   CO2 25 22 - 32 mmol/L   Glucose, Bld 104 (H) 70 - 99 mg/dL   BUN 19 6 - 20 mg/dL   Creatinine, Ser 1.13 (H) 0.44 - 1.00 mg/dL   Calcium 9.1 8.9 - 10.3 mg/dL   GFR, Estimated 59 (L) >60 mL/min   Anion gap 8 5 - 15  CBC  Result Value Ref Range   WBC 5.2 4.0 - 10.5 K/uL   RBC 4.32 3.87 - 5.11 MIL/uL   Hemoglobin 12.2 12.0 - 15.0 g/dL   HCT 37.9 36.0 - 46.0 %   MCV 87.7 80.0 - 100.0 fL   MCH 28.2 26.0 - 34.0 pg   MCHC 32.2 30.0 - 36.0 g/dL   RDW 13.8 11.5 - 15.5 %   Platelets 238 150 - 400 K/uL   nRBC 0.0 0.0 - 0.2 %       Assessment & Plan:   Problem List Items Addressed This Visit       Cardiovascular and Mediastinum   Essential hypertension - Primary    Chronic.  Elevated at visit but well controlled when patient checks blood pressures at home.  Recommend continuing to check blood pressures. Labs ordered today. Follow up in 6 months for reevaluation.       Relevant Orders   Comp Met (CMET)   Other Visit Diagnoses     Elevated serum creatinine       Labs ordered today to evaluate creatinine due to it being elevated at last visit.    Relevant Orders   Comp Met (CMET)   Screening for colon cancer       Relevant Orders   Ambulatory referral to Gastroenterology        Follow up plan: Return in about 6 months (around 10/22/2021) for Physical and Fasting labs.

## 2021-04-22 ENCOUNTER — Ambulatory Visit: Payer: 59

## 2021-04-22 LAB — COMPREHENSIVE METABOLIC PANEL
ALT: 13 IU/L (ref 0–32)
AST: 16 IU/L (ref 0–40)
Albumin/Globulin Ratio: 1.9 (ref 1.2–2.2)
Albumin: 4.4 g/dL (ref 3.8–4.9)
Alkaline Phosphatase: 67 IU/L (ref 44–121)
BUN/Creatinine Ratio: 12 (ref 9–23)
BUN: 14 mg/dL (ref 6–24)
Bilirubin Total: 0.3 mg/dL (ref 0.0–1.2)
CO2: 24 mmol/L (ref 20–29)
Calcium: 9.5 mg/dL (ref 8.7–10.2)
Chloride: 106 mmol/L (ref 96–106)
Creatinine, Ser: 1.19 mg/dL — ABNORMAL HIGH (ref 0.57–1.00)
Globulin, Total: 2.3 g/dL (ref 1.5–4.5)
Glucose: 82 mg/dL (ref 65–99)
Potassium: 4.1 mmol/L (ref 3.5–5.2)
Sodium: 144 mmol/L (ref 134–144)
Total Protein: 6.7 g/dL (ref 6.0–8.5)
eGFR: 55 mL/min/{1.73_m2} — ABNORMAL LOW (ref >59)

## 2021-04-22 NOTE — Addendum Note (Signed)
Addended by: Jon Billings on: 04/22/2021 11:27 AM   Modules accepted: Orders

## 2021-04-22 NOTE — Progress Notes (Signed)
Please let patient know that her Kidney function is still abnormal.  It is consistent with prior but we don't have a reason for it to be abnormal.  I am going to place a referral to see a nephrologist to figure out what is going on.

## 2021-04-23 ENCOUNTER — Other Ambulatory Visit: Payer: Self-pay

## 2021-04-23 MED ORDER — NA SULFATE-K SULFATE-MG SULF 17.5-3.13-1.6 GM/177ML PO SOLN: 1.0000 | ORAL | 0 refills | Status: DC

## 2021-04-26 ENCOUNTER — Telehealth: Payer: Self-pay

## 2021-04-26 NOTE — Telephone Encounter (Signed)
Copied from [place] [id number]. Topic: General - Other >> Apr 26, 2021 12:32 PM Yvette Rack wrote: Reason for CRM: Pt requests that Jon Billings return her call. Pt stated her lab work was not good and she would like to discuss it as well as a medication that she was prescribed whether or not it had an effect on her labs.

## 2021-04-27 ENCOUNTER — Telehealth: Payer: Self-pay

## 2021-04-27 NOTE — Telephone Encounter (Signed)
Lvm to make this apt. 

## 2021-04-27 NOTE — Telephone Encounter (Signed)
Pt scheduled for 04/28/2021

## 2021-04-27 NOTE — Telephone Encounter (Signed)
Copied from [place] [id number]. Topic: General - Inquiry >> Apr 27, 2021 11:41 AM Yvette Rack wrote: Reason for CRM: Pt returned call to office. Offered to schedule appt but pt declined and stated she just needs Jon Billings to call her because she does not need to keep coming in to the office. Pt stated she has not been contacted for the referral as she was told so she would like her call returned.    Per other message pt needs apt pt declining apt

## 2021-04-28 ENCOUNTER — Other Ambulatory Visit: Payer: Self-pay

## 2021-04-28 ENCOUNTER — Ambulatory Visit (INDEPENDENT_AMBULATORY_CARE_PROVIDER_SITE_OTHER): Payer: Managed Care, Other (non HMO) | Admitting: Nurse Practitioner

## 2021-04-28 ENCOUNTER — Encounter: Payer: Self-pay | Admitting: Nurse Practitioner

## 2021-04-28 VITALS — BP 138/98 | HR 67 | Ht 63.15 in | Wt 150.2 lb

## 2021-04-28 DIAGNOSIS — I10 Essential (primary) hypertension: Secondary | ICD-10-CM

## 2021-04-28 DIAGNOSIS — Z712 Person consulting for explanation of examination or test findings: Secondary | ICD-10-CM

## 2021-04-28 DIAGNOSIS — R7989 Other specified abnormal findings of blood chemistry: Secondary | ICD-10-CM | POA: Diagnosis not present

## 2021-04-28 MED ORDER — AMLODIPINE BESYLATE 10 MG PO TABS: 10.0000 mg | ORAL_TABLET | Freq: Every day | ORAL | 1 refills | Status: DC

## 2021-04-28 NOTE — Progress Notes (Signed)
BP (!) 138/98   Pulse 67   Ht 5' 3.15" (1.604 m)   Wt 150 lb 4 oz (68.2 kg)   SpO2 98%   BMI 26.49 kg/m    Subjective:    Patient ID: Cassandra Tran, female    DOB: 25-Jul-1971, 50 y.o.   MRN: [id number]  HPI: Cassandra Tran is a 50 y.o. female  Chief Complaint  Patient presents with   discuss kidney function   Medication Problem   Patient would like to discuss her recently lab work. Patient states she started taking the Wellbutrin about 1 year ago and that is when her lab work became abnormal.  She is wondering if that is what caused her kidney function to be elevated. She has stopped taking the Wellbutrin about 2 weeks ago.     Denies HA, CP, SOB, dizziness, palpitations, visual changes, and lower extremity swelling.  Relevant past medical, surgical, family and social history reviewed and updated as indicated. Interim medical history since our last visit reviewed. Allergies and medications reviewed and updated.  Review of Systems  Eyes:  Negative for visual disturbance.  Respiratory:  Negative for cough, chest tightness and shortness of breath.   Cardiovascular:  Negative for chest pain, palpitations and leg swelling.  Neurological:  Negative for dizziness and headaches.   Per HPI unless specifically indicated above     Objective:    BP (!) 138/98   Pulse 67   Ht 5' 3.15" (1.604 m)   Wt 150 lb 4 oz (68.2 kg)   SpO2 98%   BMI 26.49 kg/m   Wt Readings from Last 3 Encounters:  04/28/21 150 lb 4 oz (68.2 kg)  04/21/21 150 lb (68 kg)  12/02/20 159 lb 8 oz (72.3 kg)    Physical Exam Vitals and nursing note reviewed.  Constitutional:      General: She is not in acute distress.    Appearance: Normal appearance. She is normal weight. She is not ill-appearing, toxic-appearing or diaphoretic.  HENT:     Head: Normocephalic.     Right Ear: External ear normal.     Left Ear: External ear normal.     Nose: Nose normal.     Mouth/Throat:     Mouth: Mucous membranes are  moist.     Pharynx: Oropharynx is clear.  Eyes:     General:        Right eye: No discharge.        Left eye: No discharge.     Extraocular Movements: Extraocular movements intact.     Conjunctiva/sclera: Conjunctivae normal.     Pupils: Pupils are equal, round, and reactive to light.  Cardiovascular:     Rate and Rhythm: Normal rate and regular rhythm.     Heart sounds: No murmur heard. Pulmonary:     Effort: Pulmonary effort is normal. No respiratory distress.     Breath sounds: Normal breath sounds. No wheezing or rales.  Musculoskeletal:     Cervical back: Normal range of motion and neck supple.  Skin:    General: Skin is warm and dry.     Capillary Refill: Capillary refill takes less than 2 seconds.  Neurological:     General: No focal deficit present.     Mental Status: She is alert and oriented to person, place, and time. Mental status is at baseline.  Psychiatric:        Mood and Affect: Mood normal.        Behavior: Behavior  normal.        Thought Content: Thought content normal.        Judgment: Judgment normal.    Results for orders placed or performed in visit on 04/21/21  Comp Met (CMET)  Result Value Ref Range   Glucose 82 65 - 99 mg/dL   BUN 14 6 - 24 mg/dL   Creatinine, Ser 1.19 (H) 0.57 - 1.00 mg/dL   eGFR 55 (L) >59 mL/min/1.73   BUN/Creatinine Ratio 12 9 - 23   Sodium 144 134 - 144 mmol/L   Potassium 4.1 3.5 - 5.2 mmol/L   Chloride 106 96 - 106 mmol/L   CO2 24 20 - 29 mmol/L   Calcium 9.5 8.7 - 10.2 mg/dL   Total Protein 6.7 6.0 - 8.5 g/dL   Albumin 4.4 3.8 - 4.9 g/dL   Globulin, Total 2.3 1.5 - 4.5 g/dL   Albumin/Globulin Ratio 1.9 1.2 - 2.2   Bilirubin Total 0.3 0.0 - 1.2 mg/dL   Alkaline Phosphatase 67 44 - 121 IU/L   AST 16 0 - 40 IU/L   ALT 13 0 - 32 IU/L      Assessment & Plan:   Problem List Items Addressed This Visit       Cardiovascular and Mediastinum   Essential hypertension - Primary    Chronic. Blood pressure has been  elevated at previous visits. Will increase amlodipine to 44m daily. Will follow up in 1 month for reevaluation.       Relevant Medications   amLODipine (NORVASC) 10 MG tablet     Other   Elevated serum creatinine    Patient started Wellbutrin 7-817mogo. Stopped it two weeks ago to see if it is affecting her kidneys. Will draw labs in 2 weeks to evaluate kidney function.  Patient will still see Nephrology for further evaluation.        Relevant Orders   Comp Met (CMET)   Other Visit Diagnoses     Encounter to discuss test results       Reviewed previous blood work with patient during visit today. Explained normal CR and GFR values. All questions answered to the best of my ability.        Follow up plan: Return if symptoms worsen or fail to improve.

## 2021-04-28 NOTE — Telephone Encounter (Signed)
Please let patient know that her referral was sent to [facility]. She can call them to set up an appointment.  Referral sent to [facility] they will contact patient with appointment.   [facility]   Address: [address] [place], [place] [address]   Phone: [phone]

## 2021-04-28 NOTE — Assessment & Plan Note (Signed)
Patient started Wellbutrin 7-44moago. Stopped it two weeks ago to see if it is affecting her kidneys. Will draw labs in 2 weeks to evaluate kidney function.  Patient will still see Nephrology for further evaluation.

## 2021-04-28 NOTE — Telephone Encounter (Signed)
Will advise patient of referral at today's appointment.

## 2021-04-28 NOTE — Assessment & Plan Note (Signed)
Chronic. Blood pressure has been elevated at previous visits. Will increase amlodipine to '10mg'$  daily. Will follow up in 1 month for reevaluation.

## 2021-05-03 ENCOUNTER — Other Ambulatory Visit: Payer: Self-pay

## 2021-05-08 NOTE — Anesthesia Preprocedure Evaluation (Addendum)
Anesthesia Evaluation  Patient identified by MRN, date of birth, ID band Patient awake    Reviewed: Allergy & Precautions, NPO status , Patient's Chart, lab work & pertinent test results  History of Anesthesia Complications Negative for: history of anesthetic complications  Airway Mallampati: I   Neck ROM: Full    Dental  (+)    Pulmonary neg pulmonary ROS,    Pulmonary exam normal breath sounds clear to auscultation       Cardiovascular Exercise Tolerance: Good hypertension, Normal cardiovascular exam Rhythm:Regular Rate:Normal     Neuro/Psych negative neurological ROS     GI/Hepatic GERD  ,  Endo/Other  negative endocrine ROS  Renal/GU negative Renal ROS     Musculoskeletal   Abdominal   Peds  Hematology Breast CA   Anesthesia Other Findings Occasional marijuana use, last 2 weeks ago  Reproductive/Obstetrics                            Anesthesia Physical Anesthesia Plan  ASA: 2  Anesthesia Plan: General   Post-op Pain Management:    Induction: Intravenous  PONV Risk Score and Plan: 3 and Propofol infusion, TIVA and Treatment may vary due to age or medical condition  Airway Management Planned: Natural Airway  Additional Equipment:   Intra-op Plan:   Post-operative Plan:   Informed Consent: I have reviewed the patients History and Physical, chart, labs and discussed the procedure including the risks, benefits and alternatives for the proposed anesthesia with the patient or authorized representative who has indicated his/her understanding and acceptance.       Plan Discussed with: CRNA  Anesthesia Plan Comments:        Anesthesia Quick Evaluation

## 2021-05-10 ENCOUNTER — Encounter: Payer: Self-pay | Admitting: Gastroenterology

## 2021-05-10 ENCOUNTER — Ambulatory Visit
Admission: RE | Admit: 2021-05-10 | Discharge: 2021-05-10 | Disposition: A | Discharge status: Home / Self Care | Payer: Managed Care, Other (non HMO) | Attending: Gastroenterology | Admitting: Gastroenterology

## 2021-05-10 ENCOUNTER — Other Ambulatory Visit: Payer: Self-pay

## 2021-05-10 ENCOUNTER — Ambulatory Visit: Payer: Managed Care, Other (non HMO) | Admitting: Anesthesiology

## 2021-05-10 ENCOUNTER — Encounter
Admission: RE | Disposition: A | Discharge status: Home / Self Care | Payer: Self-pay | Source: Home / Self Care | Attending: Gastroenterology

## 2021-05-10 DIAGNOSIS — K5730 Diverticulosis of large intestine without perforation or abscess without bleeding: Secondary | ICD-10-CM | POA: Diagnosis not present

## 2021-05-10 DIAGNOSIS — Z91010 Allergy to peanuts: Secondary | ICD-10-CM | POA: Insufficient documentation

## 2021-05-10 DIAGNOSIS — Z888 Allergy status to other drugs, medicaments and biological substances status: Secondary | ICD-10-CM | POA: Diagnosis not present

## 2021-05-10 DIAGNOSIS — Z885 Allergy status to narcotic agent status: Secondary | ICD-10-CM | POA: Diagnosis not present

## 2021-05-10 DIAGNOSIS — Z1211 Encounter for screening for malignant neoplasm of colon: Secondary | ICD-10-CM | POA: Diagnosis present

## 2021-05-10 DIAGNOSIS — C50912 Malignant neoplasm of unspecified site of left female breast: Secondary | ICD-10-CM | POA: Diagnosis not present

## 2021-05-10 DIAGNOSIS — Z91018 Allergy to other foods: Secondary | ICD-10-CM | POA: Insufficient documentation

## 2021-05-10 DIAGNOSIS — K635 Polyp of colon: Secondary | ICD-10-CM | POA: Diagnosis not present

## 2021-05-10 DIAGNOSIS — Z79810 Long term (current) use of selective estrogen receptor modulators (SERMs): Secondary | ICD-10-CM | POA: Diagnosis not present

## 2021-05-10 HISTORY — PX: COLONOSCOPY: SHX5424

## 2021-05-10 HISTORY — PX: POLYPECTOMY: SHX5525

## 2021-05-10 SURGERY — COLONOSCOPY
Anesthesia: General | Site: Rectum

## 2021-05-10 MED ORDER — PROPOFOL 10 MG/ML IV BOLUS
INTRAVENOUS | Status: DC | PRN
  Administered 2021-05-10: 30 mg via INTRAVENOUS
  Administered 2021-05-10: 120 mg via INTRAVENOUS
  Administered 2021-05-10 (×2): 30 mg via INTRAVENOUS
  Administered 2021-05-10: 40 mg via INTRAVENOUS
  Administered 2021-05-10: 20 mg via INTRAVENOUS
  Administered 2021-05-10: 30 mg via INTRAVENOUS
  Administered 2021-05-10 (×2): 40 mg via INTRAVENOUS

## 2021-05-10 MED ORDER — STERILE WATER FOR IRRIGATION IR SOLN: Status: DC | PRN

## 2021-05-10 MED ORDER — LIDOCAINE HCL (CARDIAC) PF 100 MG/5ML IV SOSY
PREFILLED_SYRINGE | INTRAVENOUS | Status: DC | PRN
  Administered 2021-05-10: 30 mg via INTRAVENOUS

## 2021-05-10 MED ORDER — ACETAMINOPHEN 325 MG PO TABS: 650.0000 mg | ORAL_TABLET | Freq: Once | ORAL | Status: DC | PRN

## 2021-05-10 MED ORDER — SODIUM CHLORIDE 0.9 % IV SOLN: INTRAVENOUS | Status: DC

## 2021-05-10 MED ORDER — ONDANSETRON HCL 4 MG/2ML IJ SOLN: 4.0000 mg | Freq: Once | INTRAMUSCULAR | Status: DC | PRN

## 2021-05-10 MED ORDER — ACETAMINOPHEN 160 MG/5ML PO SOLN: 325.0000 mg | ORAL | Status: DC | PRN

## 2021-05-10 MED ORDER — LACTATED RINGERS IV SOLN: INTRAVENOUS | Status: DC

## 2021-05-10 SURGICAL SUPPLY — 8 items
GOWN CVR UNV OPN BCK APRN NK (MISCELLANEOUS) ×2 IMPLANT
GOWN ISOL THUMB LOOP REG UNIV (MISCELLANEOUS) ×4
KIT PRC NS LF DISP ENDO (KITS) ×1 IMPLANT
KIT PROCEDURE OLYMPUS (KITS) ×2
MANIFOLD NEPTUNE II (INSTRUMENTS) ×2 IMPLANT
SNARE COLD EXACTO (MISCELLANEOUS) ×2 IMPLANT
TRAP ETRAP POLY (MISCELLANEOUS) ×2 IMPLANT
WATER STERILE IRR 250ML POUR (IV SOLUTION) ×2 IMPLANT

## 2021-05-10 NOTE — Transfer of Care (Signed)
Immediate Anesthesia Transfer of Care Note  Patient: Cassandra Tran  Procedure(s) Performed: COLONOSCOPY WITH BIOPSY (Rectum) POLYPECTOMY (Rectum)  Patient Location: PACU  Anesthesia Type: General  Level of Consciousness: awake, alert  and patient cooperative  Airway and Oxygen Therapy: Patient Spontanous Breathing and Patient connected to supplemental oxygen  Post-op Assessment: Post-op Vital signs reviewed, Patient's Cardiovascular Status Stable, Respiratory Function Stable, Patent Airway and No signs of Nausea or vomiting  Post-op Vital Signs: Reviewed and stable  Complications: No notable events documented.

## 2021-05-10 NOTE — Anesthesia Procedure Notes (Signed)
Date/Time: 05/10/2021 11:15 AM Performed by: Cameron Ali, CRNA Pre-anesthesia Checklist: Patient identified, Emergency Drugs available, Suction available, Timeout performed and Patient being monitored Patient Re-evaluated:Patient Re-evaluated prior to induction Oxygen Delivery Method: Nasal cannula Placement Confirmation: positive ETCO2

## 2021-05-10 NOTE — Anesthesia Postprocedure Evaluation (Signed)
Anesthesia Post Note  Patient: Cassandra Tran  Procedure(s) Performed: COLONOSCOPY WITH BIOPSY (Rectum) POLYPECTOMY (Rectum)     Patient location during evaluation: PACU Anesthesia Type: General Level of consciousness: awake and alert, oriented and patient cooperative Pain management: pain level controlled Vital Signs Assessment: post-procedure vital signs reviewed and stable Respiratory status: spontaneous breathing, nonlabored ventilation and respiratory function stable Cardiovascular status: blood pressure returned to baseline and stable Postop Assessment: adequate PO intake Anesthetic complications: no   No notable events documented.  Darrin Nipper

## 2021-05-10 NOTE — Op Note (Signed)
[facility] Patient Name: Cassandra Tran Procedure Date: 05/10/2021 11:10 AM MRN: [id number] Account #: [id number] Date of Birth: April 22, 1971 Admit Type: Outpatient Age: 50 Room: [facility] OR ROOM 01 Gender: Female Note Status: Finalized Procedure:             Colonoscopy Indications:           Screening for colorectal malignant neoplasm Providers:             Lucilla Lame MD, MD Referring MD:          Jon Billings NP Medicines:             Propofol per Anesthesia Complications:         No immediate complications. Procedure:             Pre-Anesthesia Assessment:                        - Prior to the procedure, a History and Physical was                         performed, and patient medications and allergies were                         reviewed. The patient's tolerance of previous                         anesthesia was also reviewed. The risks and benefits                         of the procedure and the sedation options and risks                         were discussed with the patient. All questions were                         answered, and informed consent was obtained. Prior                         Anticoagulants: The patient has taken no previous                         anticoagulant or antiplatelet agents. ASA Grade                         Assessment: II - A patient with mild systemic disease.                         After reviewing the risks and benefits, the patient                         was deemed in satisfactory condition to undergo the                         procedure.                        After obtaining informed consent, the colonoscope was  passed under direct vision. Throughout the procedure,                         the patient's blood pressure, pulse, and oxygen                         saturations were monitored continuously. The was                         introduced through the anus and advanced to the  the                         cecum, identified by appendiceal orifice and ileocecal                         valve. The colonoscopy was performed without                         difficulty. The patient tolerated the procedure well.                         The quality of the bowel preparation was excellent. Findings:      The perianal and digital rectal examinations were normal.      A 4 mm polyp was found in the sigmoid colon. The polyp was sessile. The       polyp was removed with a cold snare. Resection and retrieval were       complete.      Multiple small-mouthed diverticula were found in the entire colon. Impression:            - One 4 mm polyp in the sigmoid colon, removed with a                         cold snare. Resected and retrieved.                        - Diverticulosis in the entire examined colon. Recommendation:        - Discharge patient to home.                        - Resume previous diet.                        - Continue present medications.                        - Await pathology results.                        - Repeat colonoscopy in 7 years for surveillance                         adenomatous otherise 10 years. Procedure Code(s):     --- Professional ---                        [phone], Colonoscopy, flexible; with removal of                         tumor(s), polyp(s), or other  lesion(s) by snare                         technique Diagnosis Code(s):     --- Professional ---                        Z12.11, Encounter for screening for malignant neoplasm                         of colon                        K63.5, Polyp of colon CPT copyright 2019 [employer]. All rights reserved. The codes documented in this report are preliminary and upon coder review may  be revised to meet current compliance requirements. Lucilla Lame MD, MD 05/10/2021 11:38:27 AM This report has been signed electronically. Number of Addenda: 0 Note Initiated On: 05/10/2021 11:10  AM Scope Withdrawal Time: 0 hours 7 minutes 39 seconds  Total Procedure Duration: 0 hours 10 minutes 18 seconds  Estimated Blood Loss:  Estimated blood loss: none.      [facility]

## 2021-05-10 NOTE — H&P (Signed)
Cassandra Lame, MD [facility] [address], [place], [place] [address] Phone: [phone] Fax : [phone]  Primary Care Physician:  Jon Billings, NP Primary Gastroenterologist:  Dr. Allen Norris  Pre-Procedure History & Physical: HPI:  Cassandra Tran is a 50 y.o. female is here for a screening colonoscopy.   Past Medical History:  Diagnosis Date   BRCA negative 12/26/2017   [facility]    Cancer ([place]) 11/20/2017   DUCTAL CARCINOMA IN SITU left breast   Hypertension     Past Surgical History:  Procedure Laterality Date   BREAST BIOPSY Right 02/21/2014   upper-outer calcs=atypical ductal hyperplasia and background of columnar cell change w/ calcifications. cytologic atypia per rad report from [place].   BREAST BIOPSY Right 02/21/2014   Central Right calcs=atypical ductal hyperplasia with atypical lobular hyperplasia in a background of columnar cell change with calcifications and papillary apocrine metaplasia per rad report from [place] Left 11/20/2017   Affirm Bx- DUCTAL CARCINOMA IN SITU   BREAST BIOPSY Right 11/20/2017   Affirm Bx of 2 areas-  ATYPICAL DUCTAL HYPERPLASIA and PSEUDO-ANGIOMATOUS STROMAL HYPERPLASIA   BREAST BIOPSY Bilateral 05/18/2018   Procedure: BREAST BIOPSY WITH NEEDLE LOCALIZATION;  Surgeon: Robert Bellow, MD;  Location: [facility];  Service: General;  Laterality: Bilateral;   BREAST EXCISIONAL BIOPSY Right 2015   "Benign" per pt   BREAST EXCISIONAL BIOPSY Left 05/18/2018   NL for  lumpectomy - DCIS   BREAST EXCISIONAL BIOPSY Right 05/18/2018   NL for lumpectomy for 2 areas of atypical ductal hyperplasia    BREAST RECONSTRUCTION WITH PLACEMENT OF TISSUE EXPANDER AND FLEX HD (ACELLULAR HYDRATED DERMIS) Bilateral 08/08/2018   Procedure: BREAST RECONSTRUCTION WITH PLACEMENT OF TISSUE EXPANDER AND FLEX HD (ACELLULAR HYDRATED DERMIS);  Surgeon: Wallace Going, DO;  Location: [facility];  Service: Plastics;  Laterality: Bilateral;    CESAREAN SECTION     LIPOSUCTION WITH LIPOFILLING Bilateral 03/06/2019   Procedure: Fat grafting to bilateral breast from abdomen;  Surgeon: Wallace Going, DO;  Location: [place];  Service: Plastics;  Laterality: Bilateral;  90 min, please   MASTECTOMY W/ SENTINEL NODE BIOPSY Bilateral 08/08/2018   Procedure: MASTECTOMY WITH SENTINEL LYMPH NODE BIOPSY;  Surgeon: Robert Bellow, MD;  Location: [facility];  Service: General;  Laterality: Bilateral;   REMOVAL OF BILATERAL TISSUE EXPANDERS WITH PLACEMENT OF BILATERAL BREAST IMPLANTS Bilateral 11/28/2018   Procedure: REMOVAL OF BILATERAL TISSUE EXPANDERS WITH PLACEMENT OF BILATERAL BREAST IMPLANTS;  Surgeon: Wallace Going, DO;  Location: [place];  Service: Plastics;  Laterality: Bilateral;  120 min, please    Prior to Admission medications   Medication Sig Start Date End Date Taking? Authorizing Provider  amLODipine (NORVASC) 10 MG tablet Take 1 tablet (10 mg total) by mouth daily. 04/28/21  Yes Jon Billings, NP  Na Sulfate-K Sulfate-Mg Sulf (SUPREP BOWEL PREP KIT) 17.5-3.13-1.6 GM/177ML SOLN Take 1 kit by mouth as directed. 04/23/21  Yes Cassandra Lame, MD  tamoxifen (NOLVADEX) 20 MG tablet Take 1 tablet (20 mg total) by mouth daily. 12/02/20  Yes Lloyd Huger, MD    Allergies as of 04/21/2021 - Review Complete 04/21/2021  Allergen Reaction Noted   Peanut-containing drug products Anaphylaxis 12/12/2017   Toradol [ketorolac tromethamine] Shortness Of Breath and Itching 08/21/2018   Carrot oil Itching and Other (See Comments) 07/27/2018   Hydrocodone  03/04/2020   Other  07/31/2018    Family History  Problem Relation Age of Onset  Hypertension Mother    Stroke Father    Breast cancer Neg Hx     Social History   Socioeconomic History   Marital status: Married    Spouse name: Not on file   Number of children: Not on file   Years of education: Not on file   Highest education level:  Not on file  Occupational History   Not on file  Tobacco Use   Smoking status: Never   Smokeless tobacco: Never   Tobacco comments:    Social  Vaping Use   Vaping Use: Some days   Substances: Nicotine  Substance and Sexual Activity   Alcohol use: Yes   Drug use: Yes    Types: Marijuana    Comment: on occasion   Sexual activity: Not on file  Other Topics Concern   Not on file  Social History Narrative   Not on file   Social Determinants of Health   Financial Resource Strain: Not on file  Food Insecurity: Not on file  Transportation Needs: Not on file  Physical Activity: Not on file  Stress: Not on file  Social Connections: Not on file  Intimate Partner Violence: Not on file    Review of Systems: See HPI, otherwise negative ROS  Physical Exam: BP 119/73   Pulse 85   Temp 97.8 F (36.6 C) (Temporal)   Resp 20   Wt 64.4 kg   LMP 09/18/2018 (Approximate) Comment: u preg negative 11/28/2018  SpO2 98%   BMI 25.03 kg/m  General:   Alert,  pleasant and cooperative in NAD Head:  Normocephalic and atraumatic. Neck:  Supple; no masses or thyromegaly. Lungs:  Clear throughout to auscultation.    Heart:  Regular rate and rhythm. Abdomen:  Soft, nontender and nondistended. Normal bowel sounds, without guarding, and without rebound.   Neurologic:  Alert and  oriented x4;  grossly normal neurologically.  Impression/Plan: Cassandra Tran is now here to undergo a screening colonoscopy.  Risks, benefits, and alternatives regarding colonoscopy have been reviewed with the patient.  Questions have been answered.  All parties agreeable.

## 2021-05-11 ENCOUNTER — Encounter: Payer: Self-pay | Admitting: Gastroenterology

## 2021-05-12 ENCOUNTER — Encounter: Payer: Self-pay | Admitting: Gastroenterology

## 2021-05-12 ENCOUNTER — Other Ambulatory Visit: Payer: Managed Care, Other (non HMO)

## 2021-05-12 LAB — SURGICAL PATHOLOGY

## 2021-05-13 ENCOUNTER — Other Ambulatory Visit: Payer: Self-pay

## 2021-05-21 ENCOUNTER — Telehealth: Payer: Self-pay

## 2021-05-21 NOTE — Telephone Encounter (Signed)
Copied from [place] [id number]. Topic: General - Other >> May 21, 2021 10:11 AM Tessa Lerner A wrote: Reason for CRM: Juan with [facility] has called to request for the practice to refax the details of Referral # [id number] to [facility] would also like recent labs, office visit notes and medication list of the patient  Please contact further if needed   Routing to referral coordinator.

## 2021-05-27 ENCOUNTER — Other Ambulatory Visit: Payer: Self-pay

## 2021-05-27 ENCOUNTER — Ambulatory Visit (INDEPENDENT_AMBULATORY_CARE_PROVIDER_SITE_OTHER): Payer: Self-pay

## 2021-05-27 VITALS — BP 119/73 | Temp 97.5°F | Ht 63.0 in | Wt 142.0 lb

## 2021-05-27 DIAGNOSIS — Z9013 Acquired absence of bilateral breasts and nipples: Secondary | ICD-10-CM

## 2021-05-29 NOTE — Patient Instructions (Signed)
Patient will use vaseline / xeroform & gauze dressing as needed until healed. she will call for any concerns

## 2021-05-29 NOTE — Progress Notes (Signed)
NIPPLE AREOLAR TATTOO PROCEDURE  PREOPERATIVE DIAGNOSIS:  Acquired absence of BILATERAL nipple areolar   POSTOPERATIVE DIAGNOSIS: Acquired absence of BILATERAL nipple areolar    PROCEDURES: BILATERAL nipple areolar tattoo  touch-up  ATTENDING SURGEON: Dr. Lyndee Leo Dillingham  ANESTHESIA:  EMLA  COMPLICATIONS: None.  JUSTIFICATION FOR PROCEDURE:  Ms. Havlik is a 50 y.o. female with a history of breast cancer status post breast reconstruction. The patient presents for nipple areolar complex tattoo touch-up.  Risks, benefits, indications, and alternatives of the above described procedures were discussed with the patient and all the patient's questions were answered.   DESCRIPTION OF PROCEDURE: After informed consent was obtained and proper identification of patient and surgical site was made, the patient was taken to the procedure room and pre-procedure photos taken & entered into chart. Patient was then placed supine on the operating room table. A time out was performed to confirm patient's identity and surgical site. The patient was prepped and draped in the usual sterile fashion.  . Using a # 7 pronged [employer]  tattoo head, pigment was instilled to the designed nipple areolar complexes.   Once adequate pigment had been applied to the nipple areolar complexes, a post-procedure photo taken. Vaseline/xeroform & gauze dressing was applied. The patient tolerated the procedure well.   World Famous Tattoo ink used: [employer] / Cool Peach / Tan Peach / Warm Mink / Cool Mink / Chipper [place] Duration  Lot #'s & exp dates on file

## 2021-06-02 ENCOUNTER — Other Ambulatory Visit: Payer: Self-pay | Admitting: Nephrology

## 2021-06-02 ENCOUNTER — Other Ambulatory Visit (HOSPITAL_COMMUNITY): Payer: Self-pay | Admitting: Nephrology

## 2021-06-02 DIAGNOSIS — R8290 Unspecified abnormal findings in urine: Secondary | ICD-10-CM

## 2021-06-02 DIAGNOSIS — I10 Essential (primary) hypertension: Secondary | ICD-10-CM

## 2021-06-02 DIAGNOSIS — N1831 Chronic kidney disease, stage 3a: Secondary | ICD-10-CM

## 2021-06-10 ENCOUNTER — Ambulatory Visit: Payer: Managed Care, Other (non HMO) | Attending: Nephrology

## 2021-10-08 ENCOUNTER — Encounter: Payer: Self-pay | Admitting: Nurse Practitioner

## 2021-10-08 ENCOUNTER — Other Ambulatory Visit: Payer: Self-pay

## 2021-10-08 ENCOUNTER — Ambulatory Visit: Payer: Managed Care, Other (non HMO) | Admitting: Nurse Practitioner

## 2021-10-08 VITALS — BP 119/80 | HR 82 | Temp 98.3°F | Ht 64.0 in | Wt 140.0 lb

## 2021-10-08 DIAGNOSIS — I10 Essential (primary) hypertension: Secondary | ICD-10-CM

## 2021-10-08 DIAGNOSIS — Z23 Encounter for immunization: Secondary | ICD-10-CM

## 2021-10-08 DIAGNOSIS — R202 Paresthesia of skin: Secondary | ICD-10-CM

## 2021-10-08 DIAGNOSIS — R200 Anesthesia of skin: Secondary | ICD-10-CM | POA: Diagnosis not present

## 2021-10-08 DIAGNOSIS — R7989 Other specified abnormal findings of blood chemistry: Secondary | ICD-10-CM | POA: Diagnosis not present

## 2021-10-08 MED ORDER — AMLODIPINE BESYLATE 10 MG PO TABS: 10.0000 mg | ORAL_TABLET | Freq: Every day | ORAL | 1 refills | Status: DC

## 2021-10-08 NOTE — Assessment & Plan Note (Signed)
CMP ordered today. Will make recommendations based on lab results.

## 2021-10-08 NOTE — Progress Notes (Signed)
BP 119/80    Pulse 82    Temp 98.3 F (36.8 C) (Oral)    Ht _0  (1.626 m)    Wt 140 lb (63.5 kg)    LMP 09/18/2018 (Approximate) Comment: u preg negative 11/28/2018   SpO2 99%    BMI 24.03 kg/m    Subjective:    Patient ID: Kendra Harrington, female    DOB: 1971/03/05, 51 y.o.   MRN: [id number]  HPI: Kendra Harrington is a 51 y.o. female  Chief Complaint  Patient presents with   Hypertension   Numbness    Pt states she has been having numbness and pain in 3 fingers on her R hand. States she types a lot at work    HYPERTENSION Hypertension status: controlled  Satisfied with current treatment? yes Duration of hypertension: years BP monitoring frequency:  daily BP range: 120/80 BP medication side effects:  no Medication compliance: excellent compliance Previous BP meds:amlodipine Aspirin: no Recurrent headaches: no Visual changes: no Palpitations: no Dyspnea: no Chest pain: no Lower extremity edema: no Dizzy/lightheaded: no  NUMBNESS Patient states she has been having numbness and pain on the first three fingers of her right hand. She does a lot of typing at work.  Now when she uses her mouse at work it hurts.    Relevant past medical, surgical, family and social history reviewed and updated as indicated. Interim medical history since our last visit reviewed. Allergies and medications reviewed and updated.  Review of Systems  Eyes:  Negative for visual disturbance.  Respiratory:  Negative for cough, chest tightness and shortness of breath.   Cardiovascular:  Negative for chest pain, palpitations and leg swelling.  Musculoskeletal:        Right arm pain  Neurological:  Positive for numbness. Negative for dizziness and headaches.   Per HPI unless specifically indicated above     Objective:    BP 119/80    Pulse 82    Temp 98.3 F (36.8 C) (Oral)    Ht _1  (1.626 m)    Wt 140 lb (63.5 kg)    LMP 09/18/2018 (Approximate) Comment: u preg negative 11/28/2018   SpO2 99%     BMI 24.03 kg/m   Wt Readings from Last 3 Encounters:  10/08/21 140 lb (63.5 kg)  06/02/21 142 lb (64.4 kg)  05/10/21 142 lb (64.4 kg)    Physical Exam Vitals and nursing note reviewed.  Constitutional:      General: She is not in acute distress.    Appearance: Normal appearance. She is normal weight. She is not ill-appearing, toxic-appearing or diaphoretic.  HENT:     Head: Normocephalic.     Right Ear: External ear normal.     Left Ear: External ear normal.     Nose: Nose normal.     Mouth/Throat:     Mouth: Mucous membranes are moist.     Pharynx: Oropharynx is clear.  Eyes:     General:        Right eye: No discharge.        Left eye: No discharge.     Extraocular Movements: Extraocular movements intact.     Conjunctiva/sclera: Conjunctivae normal.     Pupils: Pupils are equal, round, and reactive to light.  Cardiovascular:     Rate and Rhythm: Normal rate and regular rhythm.     Heart sounds: No murmur heard. Pulmonary:     Effort: Pulmonary effort is normal. No respiratory distress.  Breath sounds: Normal breath sounds. No wheezing or rales.  Musculoskeletal:     Cervical back: Normal range of motion and neck supple.  Skin:    General: Skin is warm and dry.     Capillary Refill: Capillary refill takes less than 2 seconds.  Neurological:     General: No focal deficit present.     Mental Status: She is alert and oriented to person, place, and time. Mental status is at baseline.  Psychiatric:        Mood and Affect: Mood normal.        Behavior: Behavior normal.        Thought Content: Thought content normal.        Judgment: Judgment normal.    Results for orders placed or performed during the hospital encounter of 05/10/21  Surgical pathology  Result Value Ref Range   SURGICAL PATHOLOGY      SURGICAL PATHOLOGY CASE: [id number] PATIENT: Kendra Harrington Surgical Pathology Report     Specimen Submitted: A. Colon polyp, sigmoid; cold snare  Clinical  History: Screening colonoscopy.  Colon polyp      DIAGNOSIS: A. COLON POLYP, SIGMOID; COLD SNARE: - HYPERPLASTIC POLYP. - NEGATIVE FOR DYSPLASIA AND MALIGNANCY.   GROSS DESCRIPTION: A. Labeled: Sigmoid colon polyp x1 (per requisition cold snare) Received: Formalin Collection time: 11:26 AM on 05/10/2021 Placed into formalin time: 11:26 AM on 05/10/2021 Tissue fragment(s): Multiple Size: Aggregate, 0.6 x 0.4 x 0.2 cm Description: Tan soft tissue fragments Entirely submitted in 1 cassette.  RB 05/11/2021  Final Diagnosis performed by Betsy Pries, MD.   Electronically signed 05/12/2021 8:14:07AM The electronic signature indicates that the named Attending Pathologist has evaluated the specimen Technical component performed at [facility], [address] [place], [place] [address] Lab: [phone] Dir: Alyson Reedy, MD, MMM  Professional component performed at [facility], [facility], [place], [place], [place] [address] Lab: [phone] Dir: Dellia Nims. Reuel Derby, MD       Assessment & Plan:   Problem List Items Addressed This Visit       Cardiovascular and Mediastinum   Essential hypertension - Primary    Chronic.  Controlled.  Continue with current medication regimen on Amlodipine 1m daily.  Refill sent today.  Labs ordered today.  Return to clinic in 6 months for reevaluation.  Call sooner if concerns arise.        Relevant Medications   amLODipine (NORVASC) 10 MG tablet     Other   Elevated serum creatinine    CMP ordered today. Will make recommendations based on lab results.      Relevant Orders   Comp Met (CMET)   Other Visit Diagnoses     Numbness and tingling in right hand       Likely related to carpal tunnel due to typing a lot for work. Recommend using a brace on her wrist at night. If symptoms do not improve will send to Ortho.   Need for influenza vaccination       Relevant Orders   Flu Vaccine QUAD 654moM (Fluarix, Fluzone &  Alfiuria Quad PF) (Completed)        Follow up plan: Return in about 6 months (around 04/07/2022) for Physical and Fasting labs.

## 2021-10-08 NOTE — Assessment & Plan Note (Signed)
Chronic.  Controlled.  Continue with current medication regimen on Amlodipine 10mg  daily.  Refill sent today.  Labs ordered today.  Return to clinic in 6 months for reevaluation.  Call sooner if concerns arise.

## 2021-10-09 LAB — COMPREHENSIVE METABOLIC PANEL
ALT: 17 IU/L (ref 0–32)
AST: 21 IU/L (ref 0–40)
Albumin/Globulin Ratio: 1.8 (ref 1.2–2.2)
Albumin: 4.6 g/dL (ref 3.8–4.9)
Alkaline Phosphatase: 71 IU/L (ref 44–121)
BUN/Creatinine Ratio: 14 (ref 9–23)
BUN: 18 mg/dL (ref 6–24)
Bilirubin Total: 0.2 mg/dL (ref 0.0–1.2)
CO2: 27 mmol/L (ref 20–29)
Calcium: 9.3 mg/dL (ref 8.7–10.2)
Chloride: 101 mmol/L (ref 96–106)
Creatinine, Ser: 1.27 mg/dL — ABNORMAL HIGH (ref 0.57–1.00)
Globulin, Total: 2.6 g/dL (ref 1.5–4.5)
Glucose: 96 mg/dL (ref 70–99)
Potassium: 4 mmol/L (ref 3.5–5.2)
Sodium: 141 mmol/L (ref 134–144)
Total Protein: 7.2 g/dL (ref 6.0–8.5)
eGFR: 51 mL/min/{1.73_m2} — ABNORMAL LOW (ref >59)

## 2021-10-11 NOTE — Progress Notes (Signed)
Please let patient know that her kidney function continues to decreased. I think it would be worth while for her to see Nephrology.  If she would like them I will place a referral.

## 2021-10-12 ENCOUNTER — Telehealth: Payer: Self-pay | Admitting: Nurse Practitioner

## 2021-10-12 NOTE — Progress Notes (Signed)
Please let patient know that it is not a bandaid, the medication helps to protect the kidneys long term as well as continuing to help with blood pressure control.   The Nephrologist, which is the kidney specialist helps managing the kidney function ongoing.  She currently has chronic kidney disease stage 3.  We want to prevent it from worsening to stage 4 and 5 which nephrology can help with.

## 2021-10-12 NOTE — Progress Notes (Signed)
Please recommend that patient follow up with Nephrology.  They wanted to see her back in 4 weeks from her appointment in September.  They also recommended that she start another blood pressure medication called valsartan to help with kidney protection.  If she is okay with this, I will send it to the pharmacy.

## 2021-10-12 NOTE — Telephone Encounter (Signed)
Pt called back to let Kendra Harrington know the [facility] approved her taking a blood thinner/ pt called to let nurse know she is ok with her sending in the blood thinner / please send to  [id number] - [place], [place] - [address] Phone:  [phone]  Fax:  [phone]

## 2021-10-14 NOTE — Telephone Encounter (Signed)
Called and LVM asking for patient to please return my call. See result note for further documentation regarding the same thing.

## 2021-10-15 MED ORDER — VALSARTAN 40 MG PO TABS: 40.0000 mg | ORAL_TABLET | Freq: Every day | ORAL | 1 refills | Status: DC

## 2021-10-15 NOTE — Progress Notes (Signed)
Medication sent to the pharmacy for patient. ?

## 2021-10-15 NOTE — Addendum Note (Signed)
Addended by: Jon Billings on: 10/15/2021 11:20 AM   Modules accepted: Orders

## 2021-11-26 NOTE — Progress Notes (Unsigned)
[place]  Telephone:([phone]) [phone] Fax:(336) [phone]  ID: Kendra Harrington OB: Aug 12, 1971  MR#: [id number]  IZT#:[id number]  Patient Care Team: Jon Billings, NP as PCP - General  I connected with Kendra Harrington on 11/26/21 at 11:00 AM EST by {Blank single:19197::"video enabled telemedicine visit","telephone visit"} and verified that I am speaking with the correct person using two identifiers.   I discussed the limitations, risks, security and privacy concerns of performing an evaluation and management service by telemedicine and the availability of in-person appointments. I also discussed with the patient that there may be a patient responsible charge related to this service. The patient expressed understanding and agreed to proceed.   Other persons participating in the visit and their role in the encounter: Patient, MD.  Patients location: Home. Providers location: Clinic.  CHIEF COMPLAINT: Pathologic stage Ia ER/PR positive, HER-2 negative invasive carcinoma of the upper outer quadrant of the right breast. DCIS of left breast.  INTERVAL HISTORY: Patient was last evaluated in clinic nearly 2 years ago.  She returns to clinic today for further evaluation.  She continues to feel well and remains asymptomatic.  She is tolerating tamoxifen without significant side effects although admits that she ran out of medication several months ago.  She had bilateral mastectomies and now has nearly completed her reconstructive surgery. She has no neurologic complaints.  She denies any recent fevers or illnesses.  She has a good appetite and denies weight loss.  She denies any chest pain, shortness of breath, cough, or hemoptysis.  She denies any nausea, vomiting, constipation, or diarrhea.  She has no urinary complaints.  Patient offers no specific complaints today.  REVIEW OF SYSTEMS:   Review of Systems  Constitutional: Negative.  Negative for fever and malaise/fatigue.   Respiratory: Negative.  Negative for cough, hemoptysis and shortness of breath.   Cardiovascular: Negative.  Negative for chest pain and leg swelling.  Gastrointestinal: Negative.  Negative for abdominal pain.  Genitourinary: Negative.  Negative for dysuria.  Musculoskeletal: Negative.  Negative for back pain.  Skin: Negative.  Negative for rash.  Neurological: Negative.  Negative for focal weakness, weakness and headaches.  Psychiatric/Behavioral: Negative.  The patient is not nervous/anxious.    As per HPI. Otherwise, a complete review of systems is negative.  PAST MEDICAL HISTORY: Past Medical History:  Diagnosis Date   BRCA negative 12/26/2017   [facility]    Cancer ([place]) 11/20/2017   DUCTAL CARCINOMA IN SITU left breast   Hypertension     PAST SURGICAL HISTORY: Past Surgical History:  Procedure Laterality Date   BREAST BIOPSY Right 02/21/2014   upper-outer calcs=atypical ductal hyperplasia and background of columnar cell change w/ calcifications. cytologic atypia per rad report from [place].   BREAST BIOPSY Right 02/21/2014   Central Right calcs=atypical ductal hyperplasia with atypical lobular hyperplasia in a background of columnar cell change with calcifications and papillary apocrine metaplasia per rad report from [place] Left 11/20/2017   Affirm Bx- DUCTAL CARCINOMA IN SITU   BREAST BIOPSY Right 11/20/2017   Affirm Bx of 2 areas-  ATYPICAL DUCTAL HYPERPLASIA and PSEUDO-ANGIOMATOUS STROMAL HYPERPLASIA   BREAST BIOPSY Bilateral 05/18/2018   Procedure: BREAST BIOPSY WITH NEEDLE LOCALIZATION;  Surgeon: Robert Bellow, MD;  Location: [facility];  Service: General;  Laterality: Bilateral;   BREAST EXCISIONAL BIOPSY Right 2015   "Benign" per pt   BREAST EXCISIONAL BIOPSY Left 05/18/2018   NL for  lumpectomy - DCIS   BREAST EXCISIONAL BIOPSY Right  05/18/2018   NL for lumpectomy for 2 areas of atypical ductal hyperplasia    BREAST RECONSTRUCTION WITH  PLACEMENT OF TISSUE EXPANDER AND FLEX HD (ACELLULAR HYDRATED DERMIS) Bilateral 08/08/2018   Procedure: BREAST RECONSTRUCTION WITH PLACEMENT OF TISSUE EXPANDER AND FLEX HD (ACELLULAR HYDRATED DERMIS);  Surgeon: Wallace Going, DO;  Location: [facility];  Service: Plastics;  Laterality: Bilateral;   CESAREAN SECTION     COLONOSCOPY N/A 05/10/2021   Procedure: COLONOSCOPY WITH BIOPSY;  Surgeon: Lucilla Lame, MD;  Location: [place];  Service: Endoscopy;  Laterality: N/A;   LIPOSUCTION WITH LIPOFILLING Bilateral 03/06/2019   Procedure: Fat grafting to bilateral breast from abdomen;  Surgeon: Wallace Going, DO;  Location: [place];  Service: Plastics;  Laterality: Bilateral;  90 min, please   MASTECTOMY W/ SENTINEL NODE BIOPSY Bilateral 08/08/2018   Procedure: MASTECTOMY WITH SENTINEL LYMPH NODE BIOPSY;  Surgeon: Robert Bellow, MD;  Location: [facility];  Service: General;  Laterality: Bilateral;   POLYPECTOMY N/A 05/10/2021   Procedure: POLYPECTOMY;  Surgeon: Lucilla Lame, MD;  Location: [place];  Service: Endoscopy;  Laterality: N/A;   REMOVAL OF BILATERAL TISSUE EXPANDERS WITH PLACEMENT OF BILATERAL BREAST IMPLANTS Bilateral 11/28/2018   Procedure: REMOVAL OF BILATERAL TISSUE EXPANDERS WITH PLACEMENT OF BILATERAL BREAST IMPLANTS;  Surgeon: Wallace Going, DO;  Location: [place];  Service: Plastics;  Laterality: Bilateral;  120 min, please    FAMILY HISTORY: Family History  Problem Relation Age of Onset   Hypertension Mother    Stroke Father    Breast cancer Neg Hx     ADVANCED DIRECTIVES (Y/N):  N  HEALTH MAINTENANCE: Social History   Tobacco Use   Smoking status: Never   Smokeless tobacco: Never   Tobacco comments:    Social  Vaping Use   Vaping Use: Some days   Substances: Nicotine  Substance Use Topics   Alcohol use: Yes   Drug use: Yes    Types: Marijuana    Comment: on occasion      Colonoscopy:  PAP:  Bone density:  Lipid panel:  Allergies  Allergen Reactions   Peanut-Containing Drug Products Anaphylaxis   Toradol [Ketorolac Tromethamine] Shortness Of Breath and Itching   Carrot Oil Itching and Other (See Comments)    Throat itchs   Hydrocodone     Itching, shortness of breath   Other     Green Beans    Current Outpatient Medications  Medication Sig Dispense Refill   amLODipine (NORVASC) 10 MG tablet Take 1 tablet (10 mg total) by mouth daily. 90 tablet 1   tamoxifen (NOLVADEX) 20 MG tablet Take 1 tablet (20 mg total) by mouth daily. 90 tablet 3   valsartan (DIOVAN) 40 MG tablet Take 1 tablet (40 mg total) by mouth daily. 90 tablet 1   No current facility-administered medications for this visit.    OBJECTIVE: There were no vitals filed for this visit.    There is no height or weight on file to calculate BMI.    ECOG FS:0 - Asymptomatic  General: Well-developed, well-nourished, no acute distress. Eyes: Pink conjunctiva, anicteric sclera. HEENT: Normocephalic, moist mucous membranes. Breast: Bilateral mastectomy with reconstruction. Lungs: No audible wheezing or coughing. Heart: Regular rate and rhythm. Abdomen: Soft, nontender, no obvious distention. Musculoskeletal: No edema, cyanosis, or clubbing. Neuro: Alert, answering all questions appropriately. Cranial nerves grossly intact. Skin: No rashes or petechiae noted. Psych: Normal affect.   LAB RESULTS:  Lab Results  Component Value Date   NA 141 10/08/2021   K 4.0 10/08/2021   CL 101 10/08/2021   CO2 27 10/08/2021   GLUCOSE 96 10/08/2021   BUN 18 10/08/2021   CREATININE 1.27 (H) 10/08/2021   CALCIUM 9.3 10/08/2021   PROT 7.2 10/08/2021   ALBUMIN 4.6 10/08/2021   AST 21 10/08/2021   ALT 17 10/08/2021   ALKPHOS 71 10/08/2021   BILITOT 0.2 10/08/2021   GFRNONAA 59 (L) 09/21/2020   GFRAA 72 08/13/2020    Lab Results  Component Value Date   WBC 5.2 09/21/2020   NEUTROABS 2.3  10/21/2019   HGB 12.2 09/21/2020   HCT 37.9 09/21/2020   MCV 87.7 09/21/2020   PLT 238 09/21/2020     STUDIES: No results found.  ASSESSMENT: Pathologic stage Ia ER/PR positive, HER-2 negative invasive carcinoma of the upper outer quadrant of the right breast. DCIS of left breast  PLAN:    1.  Pathologic stage Ia ER/PR positive, HER-2 negative invasive carcinoma of the upper outer quadrant of the right breast: Patient has now completed bilateral mastectomy with reconstruction.  Oncotype DX score was not able to be done secondary to insufficient tissue.  Patient did not require adjuvant chemotherapy or XRT.  Patient was given a refill of tamoxifen today.  She will continue a minimum of 5 years of treatment completing in November 2024.  Can consider extending tamoxifen given her age and bilateral breast lesions.  Patient has undergone bilateral mastectomy, therefore no further mammograms are necessary.  Patient with video-assisted visit in 1 year for further evaluation.   2. DCIS of left breast: Tamoxifen as above. 3.  Breast reconstruction: Continue follow-up with plastic surgery as scheduled. 4.  Genetic testing: Negative.  I provided *** minutes of {Blank single:19197::"face-to-face video visit time","non face-to-face telephone visit time"} during this encounter which included chart review, counseling, and coordination of care as documented above.   Patient expressed understanding and was in agreement with this plan. She also understands that She can call clinic at any time with any questions, concerns, or complaints.    Cancer Staging  Malignant neoplasm of upper-outer quadrant of right breast in female, estrogen receptor positive ([place]) Staging form: Breast, AJCC 8th Edition - Clinical stage from 08/26/2018: Stage IA (cT1a, cN0, cM0, G1, ER+, PR+, HER2-) - Signed by Lloyd Huger, MD on 08/26/2018 Histologic grading system: 3 grade system Laterality: Right   Lloyd Huger, MD   11/26/2021 4:03 PM  Addendum: Insufficient tissue for further testing with Oncotype DX.  Proceed with tamoxifen as above.   Lloyd Huger, MD 11/26/21 4:03 PM

## 2021-12-02 ENCOUNTER — Other Ambulatory Visit: Payer: Self-pay

## 2021-12-02 ENCOUNTER — Inpatient Hospital Stay: Payer: Managed Care, Other (non HMO) | Admitting: Oncology

## 2021-12-02 ENCOUNTER — Telehealth: Payer: Self-pay

## 2021-12-02 DIAGNOSIS — C50411 Malignant neoplasm of upper-outer quadrant of right female breast: Secondary | ICD-10-CM

## 2021-12-02 NOTE — Telephone Encounter (Signed)
Gust Rung, RN Called pt twice today to go over encounter prior to apt. I called pt once today and left message to call back to go over encounter for apt.

## 2021-12-16 ENCOUNTER — Other Ambulatory Visit: Payer: Self-pay | Admitting: Nurse Practitioner

## 2021-12-16 NOTE — Telephone Encounter (Signed)
Dose was increased to '10mg'$  Oct 08, 2021. ?Requested Prescriptions  ?Pending Prescriptions Disp Refills  ?? amLODipine (NORVASC) 5 MG tablet [Pharmacy Med Name: AMLODIPINE BESYLATE 5 MG TAB] 180 tablet 4  ?  Sig: TAKE 1 TABLET (5 MG TOTAL) BY MOUTH IN THE MORNING AND 1 TABLET AT BEDTIME.  ?  ? Cardiovascular: Calcium Channel Blockers 2 Passed - 12/16/2021  8:58 AM  ?  ?  Passed - Last BP in normal range  ?  BP Readings from Last 1 Encounters:  ?10/08/21 119/80  ?   ?  ?  Passed - Last Heart Rate in normal range  ?  Pulse Readings from Last 1 Encounters:  ?10/08/21 82  ?   ?  ?  Passed - Valid encounter within last 6 months  ?  Recent Outpatient Visits   ?      ? 2 months ago Essential hypertension  ? [place], NP  ? 7 months ago Essential hypertension  ? [place], NP  ? 7 months ago Essential hypertension  ? [place], NP  ? 1 year ago Dizziness  ? [place], Henrine Screws T, NP  ? 1 year ago Essential hypertension  ? [facility] [place], Henrine Screws T, NP  ?  ?  ?Future Appointments   ?        ? In 3 months Jon Billings, NP [facility], [facility]  ?  ? ?  ?  ?  ? ? ?

## 2021-12-17 ENCOUNTER — Telehealth: Payer: Self-pay | Admitting: *Deleted

## 2021-12-17 NOTE — Telephone Encounter (Signed)
Patient called and left a detailed vm requesting a mychart video apt with Dr. Grayland Ormond (her 1 year follow-up). Jerene Pitch- will you call patient back to arrange with Dr. Gary Fleet schedule. Thanks. - ?

## 2021-12-20 ENCOUNTER — Encounter: Payer: Self-pay | Admitting: Internal Medicine

## 2021-12-21 ENCOUNTER — Other Ambulatory Visit: Payer: Self-pay | Admitting: Nurse Practitioner

## 2021-12-22 NOTE — Telephone Encounter (Signed)
Requested medications are due for refill today.  no ? ?Requested medications are on the active medications list.  yes ? ?Last refill. 02/06/2021 #30 0 refills ? ?Future visit scheduled.   yes ? ?Notes to clinic. Pt is now taking '10mg'$ . Last refilled by Dr. Fritzi Mandes. ? ? ? ?Requested Prescriptions  ?Pending Prescriptions Disp Refills  ? amLODipine (NORVASC) 5 MG tablet [Pharmacy Med Name: AMLODIPINE BESYLATE 5 MG TAB] 180 tablet 4  ?  Sig: TAKE 1 TABLET (5 MG TOTAL) BY MOUTH IN THE MORNING AND 1 TABLET AT BEDTIME.  ?  ? Cardiovascular: Calcium Channel Blockers 2 Passed - 12/21/2021 10:37 AM  ?  ?  Passed - Last BP in normal range  ?  BP Readings from Last 1 Encounters:  ?10/08/21 119/80  ?  ?  ?  ?  Passed - Last Heart Rate in normal range  ?  Pulse Readings from Last 1 Encounters:  ?10/08/21 82  ?  ?  ?  ?  Passed - Valid encounter within last 6 months  ?  Recent Outpatient Visits   ? ?      ? 2 months ago Essential hypertension  ? [place], NP  ? 7 months ago Essential hypertension  ? [place], NP  ? 8 months ago Essential hypertension  ? [place], NP  ? 1 year ago Dizziness  ? [place], Henrine Screws T, NP  ? 1 year ago Essential hypertension  ? [facility] [place], Henrine Screws T, NP  ? ?  ?  ?Future Appointments   ? ?        ? In 2 weeks Grayland Ormond, Kathlene November, MD [facility] [place]  ? In 3 months Jon Billings, NP [facility], [facility]  ? ?  ? ?  ?  ?  ?  ?

## 2022-01-04 ENCOUNTER — Ambulatory Visit: Payer: Self-pay | Admitting: *Deleted

## 2022-01-04 ENCOUNTER — Other Ambulatory Visit: Payer: Self-pay | Admitting: Nurse Practitioner

## 2022-01-04 MED ORDER — AMLODIPINE BESYLATE 10 MG PO TABS: 10.0000 mg | ORAL_TABLET | Freq: Every day | ORAL | 0 refills | Status: DC

## 2022-01-04 NOTE — Telephone Encounter (Signed)
Reason for Disposition ?? Caller has medicine question only, adult not sick, AND triager answers question ? ?Answer Assessment - Initial Assessment Questions ?1. NAME of MEDICATION: "What medicine are you calling about?" ?    Norvasc. Refill Norvasc. ? ?Protocols used: Medication Question Call-A-AH ? ?

## 2022-01-04 NOTE — Telephone Encounter (Signed)
Opened chart to answer question for agent.  Pt calling in about amlodipine.   She is still waiting on the refill.   She called this morning and spoke with another nurse regarding this. ?It looks like the refill request was for 5 mg when it should have been for 10 mg.   The rx was then requested for 10 mg but sent as  a "print" prescription instead of a "normal" electronic prescription. ? ?I had the agent send it to the practice so it could be changed from "print" status to "normal" or electronic status.   ?Reason for Disposition ? [1] Caller has URGENT medicine question about med that PCP or specialist prescribed AND [2] triager unable to answer question ?   Amlodipine 10 mg rx needs to be changed to be sent electronically instead of "print".   It needs to be "normal" sent. ? ?Answer Assessment - Initial Assessment Questions ?1. NAME of MEDICATION: "What medicine are you calling about?" ?    amlodipine ?2. QUESTION: "What is your question?" (e.g., double dose of medicine, side effect) ?    Agent let me know it's still not at the pharmacy and pt has been trying to get this refill for over a month ?3. PRESCRIBING HCP: "Who prescribed it?" Reason: if prescribed by specialist, call should be referred to that group. ?    Jon Billings, NP ?See documentation note. ?4. SYMPTOMS: "Do you have any symptoms?" ?    *No Answer* ?5. SEVERITY: If symptoms are present, ask "Are they mild, moderate or severe?" ?    *No Answer* ?6. PREGNANCY:  "Is there any chance that you are pregnant?" "When was your last menstrual period?" ?    *No Answer* ? ?Protocols used: Medication Question Call-A-AH ? ?

## 2022-01-04 NOTE — Telephone Encounter (Addendum)
? ?  Chief Complaint: med not at [facility] ?Symptoms: na ?Frequency: na ?Pertinent Negatives: Patient denies na ?Disposition: '[]'$ ED /'[]'$ Urgent Care (no appt availability in office) / '[]'$ Appointment(In office/virtual)/ '[]'$  [facility]/ '[]'$ Home Care/ '[]'$ Refused Recommended Disposition /'[]'$ [place] [facility]/ '[]'$  Follow-up with PCP ?Additional Notes: Pt very upset, screaming. Tried to calm her so I could look into the problem. The problem was the rx was never received. It is 3 months later so I removed one refill, pt was seen in Jan due to be back in July, filled for #90.  ? ? ? ? ?This rx was sent 10/08/21 but no receipt received. Filled today for #90 ?Requested Prescriptions  ?Pending Prescriptions Disp Refills  ? amLODipine (NORVASC) 10 MG tablet 90 tablet 0  ?  Sig: Take 1 tablet (10 mg total) by mouth daily.  ?  ? Cardiovascular: Calcium Channel Blockers 2 Passed - 01/04/2022  8:44 AM  ?  ?  Passed - Last BP in normal range  ?  BP Readings from Last 1 Encounters:  ?10/08/21 119/80  ?   ?  ?  Passed - Last Heart Rate in normal range  ?  Pulse Readings from Last 1 Encounters:  ?10/08/21 82  ?   ?  ?  Passed - Valid encounter within last 6 months  ?  Recent Outpatient Visits   ? ?      ? 2 months ago Essential hypertension  ? [place], NP  ? 8 months ago Essential hypertension  ? [place], NP  ? 8 months ago Essential hypertension  ? [place], NP  ? 1 year ago Dizziness  ? [place], Henrine Screws T, NP  ? 1 year ago Essential hypertension  ? [facility] [place], Henrine Screws T, NP  ? ?  ?  ?Future Appointments   ? ?        ? In 1 week Grayland Ormond, Kathlene November, MD [facility] [place]  ? In 3 months Jon Billings, NP [facility], [facility]  ? ?  ? ?  ?  ?  ? ? ?

## 2022-01-04 NOTE — Telephone Encounter (Signed)
Pt called, is missing her medication for over a month. Her amlodipine 10 MG has been approved however she takes 5 MG twice daily (am/pm). The 10 MG has been approved however it has not been e-scribed, the class says "no print"  ? ?She needs a Rx for Amlodipine 5 MG twice daily (1 in the am and 1 in the pm) e-scribed to  ? ?[id number] [place] [facility] [address]  [id number] ?Phone: [phone]: [phone] ? ?

## 2022-01-04 NOTE — Addendum Note (Signed)
Addended by: Hoyle Barr on: 01/04/2022 08:44 AM ? ? Modules accepted: Orders ? ?

## 2022-01-05 MED ORDER — AMLODIPINE BESYLATE 10 MG PO TABS: 10.0000 mg | ORAL_TABLET | Freq: Every day | ORAL | 0 refills | Status: DC

## 2022-01-05 NOTE — Addendum Note (Signed)
Addended by: Jon Billings on: 01/05/2022 07:47 AM ? ? Modules accepted: Orders ? ?

## 2022-01-05 NOTE — Telephone Encounter (Signed)
Medication sent to the pharmacy.

## 2022-01-10 NOTE — Progress Notes (Signed)
?  [place]  ?[phone] [phone] ? ?ID: [name] OB: 08/08/1971  MR#: [id number]  IVH#:[id number] ? ?Patient Care Team: ?Jon Billings, NP as PCP - General ?Rico Junker, RN as Registered Nurse ?Theodore Demark, RN as Registered Nurse ?Kerri Perches, PA-C (Physician Assistant) ? ? ? ? ?Lloyd Huger, MD   01/10/2022 11:15 PM ? ? ? ? ?This encounter was created in error - please disregard. ?

## 2022-01-11 ENCOUNTER — Telehealth: Payer: Self-pay | Admitting: Oncology

## 2022-01-11 ENCOUNTER — Inpatient Hospital Stay: Payer: Managed Care, Other (non HMO) | Admitting: Oncology

## 2022-01-11 DIAGNOSIS — Z170 Estrogen receptor positive status [ER+]: Secondary | ICD-10-CM

## 2022-01-11 NOTE — Telephone Encounter (Signed)
Called patient to rescheduled her missed 4/11 Mychart visit. Pt husband called back and stated we had called the incorrect number. Upon further investigation the patient was not aware of the appointment. She does not know how to do Mychart visits. The patient wanted to conduct a telephone visit, which I explained we did not do. The patient was offered a in office appoint instead and she hung the phone up on me. I'll reschedule the appointment and mail it to the patient. ?

## 2022-01-15 ENCOUNTER — Other Ambulatory Visit: Payer: Self-pay | Admitting: Oncology

## 2022-01-28 NOTE — Progress Notes (Signed)
?[place]  ?[phone] [phone] ? ?ID: [name] OB: [birth year]  MR#: [id number]  GXQ#:[id number] ? ?Patient Care Team: ?Jon Billings, NP as PCP - General ?Rico Junker, RN as Registered Nurse ?Theodore Demark, RN as Registered Nurse ?Kerri Perches, PA-C (Physician Assistant) ? ?CHIEF COMPLAINT: Pathologic stage Ia ER/PR positive, HER-2 negative invasive carcinoma of the upper outer quadrant of the right breast. DCIS of left breast. ? ?INTERVAL HISTORY: Patient was last evaluated in March 2022. She currently feels well and is asymptomatic.  She state she is compliant with Tamoxifen and tolerating it well. She had bilateral mastectomies. She has no neurologic complaints.  She denies any recent fevers or illnesses.  She has a good appetite and denies weight loss.  She denies any chest pain, shortness of breath, cough, or hemoptysis.  She denies any nausea, vomiting, constipation, or diarrhea.  She has no urinary complaints.  Patient offers no specific complaints today. ? ?REVIEW OF SYSTEMS:   ?Review of Systems  ?Constitutional: Negative.  Negative for fever and malaise/fatigue.  ?Respiratory: Negative.  Negative for cough, hemoptysis and shortness of breath.   ?Cardiovascular: Negative.  Negative for chest pain and leg swelling.  ?Gastrointestinal: Negative.  Negative for abdominal pain.  ?Genitourinary: Negative.  Negative for dysuria.  ?Musculoskeletal: Negative.  Negative for back pain.  ?Skin: Negative.  Negative for rash.  ?Neurological: Negative.  Negative for focal weakness, weakness and headaches.  ?Psychiatric/Behavioral: Negative.  The patient is not nervous/anxious.   ? ?As per HPI. Otherwise, a complete review of systems is negative. ? ?PAST MEDICAL HISTORY: ?Past Medical History:  ?Diagnosis Date  ? BRCA negative 12/26/2017  ? [facility]   ? Breast cancer ([place])   ? Cancer ([place]) 11/20/2017  ? DUCTAL CARCINOMA IN SITU left breast  ? HTN (hypertension)    ? Hypertension   ? Tobacco abuse   ? ? ?PAST SURGICAL HISTORY: ?Past Surgical History:  ?Procedure Laterality Date  ? BREAST BIOPSY Right 02/21/2014  ? upper-outer calcs=atypical ductal hyperplasia and background of columnar cell change w/ calcifications. cytologic atypia per rad report from [place].  ? BREAST BIOPSY Right 02/21/2014  ? Central Right calcs=atypical ductal hyperplasia with atypical lobular hyperplasia in a background of columnar cell change with calcifications and papillary apocrine metaplasia per rad report from [place]  ? BREAST BIOPSY Left 11/20/2017  ? Affirm Bx- DUCTAL CARCINOMA IN SITU  ? BREAST BIOPSY Right 11/20/2017  ? Affirm Bx of 2 areas-  ATYPICAL DUCTAL HYPERPLASIA and PSEUDO-ANGIOMATOUS STROMAL HYPERPLASIA  ? BREAST BIOPSY Bilateral 05/18/2018  ? Procedure: BREAST BIOPSY WITH NEEDLE LOCALIZATION;  Surgeon: Robert Bellow, MD;  Location: [facility];  Service: General;  Laterality: Bilateral;  ? BREAST EXCISIONAL BIOPSY Right 2015  ? "Benign" per pt  ? BREAST EXCISIONAL BIOPSY Left 05/18/2018  ? NL for  lumpectomy - DCIS  ? BREAST EXCISIONAL BIOPSY Right 05/18/2018  ? NL for lumpectomy for 2 areas of atypical ductal hyperplasia   ? BREAST RECONSTRUCTION WITH PLACEMENT OF TISSUE EXPANDER AND FLEX HD (ACELLULAR HYDRATED DERMIS) Bilateral 08/08/2018  ? Procedure: BREAST RECONSTRUCTION WITH PLACEMENT OF TISSUE EXPANDER AND FLEX HD (ACELLULAR HYDRATED DERMIS);  Surgeon: Wallace Going, DO;  Location: [facility];  Service: Plastics;  Laterality: Bilateral;  ? CESAREAN SECTION    ? COLONOSCOPY N/A 05/10/2021  ? Procedure: COLONOSCOPY WITH BIOPSY;  Surgeon: Lucilla Lame, MD;  Location: [place];  Service: Endoscopy;  Laterality: N/A;  ? LIPOSUCTION WITH LIPOFILLING Bilateral 03/06/2019  ?  Procedure: Fat grafting to bilateral breast from abdomen;  Surgeon: Wallace Going, DO;  Location: [place];  Service: Plastics;  Laterality: Bilateral;  90 min, please  ?  MASTECTOMY Bilateral   ? MASTECTOMY W/ SENTINEL NODE BIOPSY Bilateral 08/08/2018  ? Procedure: MASTECTOMY WITH SENTINEL LYMPH NODE BIOPSY;  Surgeon: Robert Bellow, MD;  Location: [facility];  Service: General;  Laterality: Bilateral;  ? POLYPECTOMY N/A 05/10/2021  ? Procedure: POLYPECTOMY;  Surgeon: Lucilla Lame, MD;  Location: [place];  Service: Endoscopy;  Laterality: N/A;  ? REMOVAL OF BILATERAL TISSUE EXPANDERS WITH PLACEMENT OF BILATERAL BREAST IMPLANTS Bilateral 11/28/2018  ? Procedure: REMOVAL OF BILATERAL TISSUE EXPANDERS WITH PLACEMENT OF BILATERAL BREAST IMPLANTS;  Surgeon: Wallace Going, DO;  Location: [place];  Service: Plastics;  Laterality: Bilateral;  120 min, please  ? ? ?FAMILY HISTORY: ?Family History  ?Problem Relation Age of Onset  ? Hypertension Mother   ? Stroke Father 17  ? Breast cancer Neg Hx   ? Sudden Cardiac Death Mother 59  ?     at age 59  ? ? ?ADVANCED DIRECTIVES (Y/N):  N ? ?HEALTH MAINTENANCE: ?Social History  ? ?Tobacco Use  ? Smoking status: Never  ? Smokeless tobacco: Never  ? Tobacco comments:  ?  Social  ?Vaping Use  ? Vaping Use: Some days  ? Substances: Nicotine  ?Substance Use Topics  ? Alcohol use: Yes  ? Drug use: Yes  ?  Types: Marijuana  ?  Comment: on occasion  ? ? ? Colonoscopy: ? PAP: ? Bone density: ? Lipid panel: ? ?Allergies  ?Allergen Reactions  ? Peanut-Containing Drug Products Anaphylaxis  ? Toradol [Ketorolac Tromethamine] Shortness Of Breath and Itching  ? Carrot Oil Itching and Other (See Comments)  ?  Throat itchs  ? Hydrocodone   ?  Itching, shortness of breath  ? Other   ?  Green Beans  ? ? ?Current Outpatient Medications  ?Medication Sig Dispense Refill  ? amLODipine (NORVASC) 5 MG tablet TAKE 1 TABLET (5 MG TOTAL) BY MOUTH IN THE MORNING AND 1 TABLET AT BEDTIME. 180 tablet 1  ? tamoxifen (NOLVADEX) 20 MG tablet Take 20 mg by mouth daily.    ? tamoxifen (NOLVADEX) 20 MG tablet TAKE 1 TABLET BY MOUTH EVERY DAY 30 tablet 0   ? valsartan (DIOVAN) 40 MG tablet Take 1 tablet (40 mg total) by mouth daily. 90 tablet 1  ? buPROPion (WELLBUTRIN XL) 300 MG 24 hr tablet Take 300 mg by mouth daily. (Patient not taking: Reported on 02/03/2022)    ? ?No current facility-administered medications for this visit.  ? ? ?OBJECTIVE: ?Vitals:  ? 02/03/22 1035 02/03/22 1121  ?BP: (!) 164/109 (!) 143/100  ?Pulse: 92 67  ?Resp: 16   ?Temp: (!) 96 ?F (35.6 ?C)   ?SpO2: 98%   ?   Body mass index is 23.52 kg/m?Marland Kitchen    ECOG FS:0 - Asymptomatic ? ?General: Well-developed, well-nourished, no acute distress. ?Eyes: Pink conjunctiva, anicteric sclera. ?HEENT: Normocephalic, moist mucous membranes. ?Breasts: Bilateral mastectomies. ?Lungs: No audible wheezing or coughing. ?Heart: Regular rate and rhythm. ?Abdomen: Soft, nontender, no obvious distention. ?Musculoskeletal: No edema, cyanosis, or clubbing. ?Neuro: Alert, answering all questions appropriately. Cranial nerves grossly intact. ?Skin: No rashes or petechiae noted. ?Psych: Normal affect. ? ?LAB RESULTS: ? ?Lab Results  ?Component Value Date  ? NA 141 10/08/2021  ? K 4.0 10/08/2021  ? CL 101 10/08/2021  ? CO2 27  10/08/2021  ? GLUCOSE 96 10/08/2021  ? BUN 18 10/08/2021  ? CREATININE 1.27 (H) 10/08/2021  ? CALCIUM 9.3 10/08/2021  ? PROT 7.2 10/08/2021  ? ALBUMIN 4.6 10/08/2021  ? AST 21 10/08/2021  ? ALT 17 10/08/2021  ? ALKPHOS 71 10/08/2021  ? BILITOT 0.2 10/08/2021  ? GFRNONAA 53 (L) 02/06/2021  ? GFRAA 72 08/13/2020  ? ? ?Lab Results  ?Component Value Date  ? WBC 4.4 02/06/2021  ? NEUTROABS 2.3 10/21/2019  ? HGB 10.4 (L) 02/06/2021  ? HCT 32.4 (L) 02/06/2021  ? MCV 87.1 02/06/2021  ? PLT 208 02/06/2021  ? ? ? ?STUDIES: ?No results found. ? ?ASSESSMENT: Pathologic stage Ia ER/PR positive, HER-2 negative invasive carcinoma of the upper outer quadrant of the right breast. DCIS of left breast ? ?PLAN:   ? ?1.  Pathologic stage Ia ER/PR positive, HER-2 negative invasive carcinoma of the upper outer quadrant of the  right breast: Patient has now completed bilateral mastectomy with reconstruction.  Oncotype DX score was not able to be done secondary to insufficient tissue.  Patient did not require adjuvant chemother

## 2022-01-31 ENCOUNTER — Other Ambulatory Visit: Payer: Self-pay | Admitting: Oncology

## 2022-02-03 ENCOUNTER — Encounter: Payer: Self-pay | Admitting: Oncology

## 2022-02-03 ENCOUNTER — Inpatient Hospital Stay: Payer: Managed Care, Other (non HMO) | Attending: Oncology | Admitting: Oncology

## 2022-02-03 VITALS — BP 143/100 | HR 67 | Temp 96.0°F | Resp 16 | Ht 64.0 in | Wt 137.0 lb

## 2022-02-03 DIAGNOSIS — Z170 Estrogen receptor positive status [ER+]: Secondary | ICD-10-CM | POA: Insufficient documentation

## 2022-02-03 DIAGNOSIS — D0512 Intraductal carcinoma in situ of left breast: Secondary | ICD-10-CM

## 2022-02-03 DIAGNOSIS — Z79899 Other long term (current) drug therapy: Secondary | ICD-10-CM | POA: Diagnosis not present

## 2022-02-03 DIAGNOSIS — F17290 Nicotine dependence, other tobacco product, uncomplicated: Secondary | ICD-10-CM | POA: Diagnosis not present

## 2022-02-03 DIAGNOSIS — C50411 Malignant neoplasm of upper-outer quadrant of right female breast: Secondary | ICD-10-CM | POA: Insufficient documentation

## 2022-02-03 DIAGNOSIS — Z79811 Long term (current) use of aromatase inhibitors: Secondary | ICD-10-CM | POA: Diagnosis not present

## 2022-02-03 DIAGNOSIS — Z9013 Acquired absence of bilateral breasts and nipples: Secondary | ICD-10-CM | POA: Insufficient documentation

## 2022-03-29 ENCOUNTER — Other Ambulatory Visit: Payer: Self-pay | Admitting: Nurse Practitioner

## 2022-04-11 NOTE — Progress Notes (Signed)
LMP 09/18/2018 (Approximate) Comment: u preg negative 11/28/2018   Subjective:    Patient ID: Cassandra Tran, female    DOB: [birth year], 51 y.o.   MRN: [id number]  HPI: Cassandra Tran is a 51 y.o. female presenting on 04/12/2022 for comprehensive medical examination. Current medical complaints include:{Blank single:19197::"none","***"}  She currently lives with: Menopausal Symptoms: {Blank single:19197::"yes","no"}  HYPERTENSION Hypertension status: {Blank single:19197::"controlled","uncontrolled","better","worse","exacerbated","stable"}  Satisfied with current treatment? {Blank single:19197::"yes","no"} Duration of hypertension: {Blank single:19197::"chronic","months","years"} BP monitoring frequency:  {Blank single:19197::"not checking","rarely","daily","weekly","monthly","a few times a day","a few times a week","a few times a month"} BP range:  BP medication side effects:  {Blank single:19197::"yes","no"} Medication compliance: {Blank single:19197::"excellent compliance","good compliance","fair compliance","poor compliance"} Previous BP meds:{Blank VZDGLOVF:64332::"RJJO","ACZYSAYTKZ","SWFUXNATFT/DDUKGURKYH","CWCBJSEG","BTDVVOHYWV","PXTGGYIRSW/NIOE","VOJJKKXFGH (bystolic)","carvedilol","chlorthalidone","clonidine","diltiazem","exforge HCT","HCTZ","irbesartan (avapro)","labetalol","lisinopril","lisinopril-HCTZ","losartan (cozaar)","methyldopa","nifedipine","olmesartan (benicar)","olmesartan-HCTZ","quinapril","ramipril","spironalactone","tekturna","valsartan","valsartan-HCTZ","verapamil"} Aspirin: {Blank single:19197::"yes","no"} Recurrent headaches: {Blank single:19197::"yes","no"} Visual changes: {Blank single:19197::"yes","no"} Palpitations: {Blank single:19197::"yes","no"} Dyspnea: {Blank single:19197::"yes","no"} Chest pain: {Blank single:19197::"yes","no"} Lower extremity edema: {Blank single:19197::"yes","no"} Dizzy/lightheaded: {Blank single:19197::"yes","no"}  MOOD  Depression  Screen done today and results listed below:     10/08/2021    2:16 PM 09/21/2020    1:32 PM 10/21/2019   10:24 AM 07/31/2018    8:35 AM  Depression screen PHQ 2/9  Decreased Interest 3 0 0 1  Down, Depressed, Hopeless 0 0 0 0  PHQ - 2 Score 3 0 0 1  Altered sleeping 3  3 0  Tired, decreased energy 0  0 3  Change in appetite 0  0 0  Feeling bad or failure about yourself  0  0 0  Trouble concentrating 1  0 0  Moving slowly or fidgety/restless 0  0 0  Suicidal thoughts 0  0 0  PHQ-9 Score 7  3 4   Difficult doing work/chores Not difficult at all  Not difficult at all     The patient {has/does not WEXH:37169} a history of falls. I {did/did not:19850} complete a risk assessment for falls. A plan of care for falls {was/was not:19852} documented.   Past Medical History:  Past Medical History:  Diagnosis Date   BRCA negative 12/26/2017   [facility]    Breast cancer ([place])    Cancer ([place]) 11/20/2017   DUCTAL CARCINOMA IN SITU left breast   HTN (hypertension)    Hypertension    Tobacco abuse     Surgical History:  Past Surgical History:  Procedure Laterality Date   BREAST BIOPSY Right 02/21/2014   upper-outer calcs=atypical ductal hyperplasia and background of columnar cell change w/ calcifications. cytologic atypia per rad report from [place].   BREAST BIOPSY Right 02/21/2014   Central Right calcs=atypical ductal hyperplasia with atypical lobular hyperplasia in a background of columnar cell change with calcifications and papillary apocrine metaplasia per rad report from [place] Left 11/20/2017   Affirm Bx- DUCTAL CARCINOMA IN SITU   BREAST BIOPSY Right 11/20/2017   Affirm Bx of 2 areas-  ATYPICAL DUCTAL HYPERPLASIA and PSEUDO-ANGIOMATOUS STROMAL HYPERPLASIA   BREAST BIOPSY Bilateral 05/18/2018   Procedure: BREAST BIOPSY WITH NEEDLE LOCALIZATION;  Surgeon: Robert Bellow, MD;  Location: [facility];  Service: General;  Laterality: Bilateral;   BREAST EXCISIONAL  BIOPSY Right 2015   "Benign" per pt   BREAST EXCISIONAL BIOPSY Left 05/18/2018   NL for  lumpectomy - DCIS   BREAST EXCISIONAL BIOPSY Right 05/18/2018   NL for lumpectomy for 2 areas of atypical ductal hyperplasia    BREAST RECONSTRUCTION WITH PLACEMENT OF TISSUE EXPANDER AND FLEX HD (ACELLULAR HYDRATED DERMIS) Bilateral 08/08/2018   Procedure: BREAST RECONSTRUCTION WITH PLACEMENT OF TISSUE EXPANDER  AND FLEX HD (ACELLULAR HYDRATED DERMIS);  Surgeon: Wallace Going, DO;  Location: [facility];  Service: Plastics;  Laterality: Bilateral;   CESAREAN SECTION     COLONOSCOPY N/A 05/10/2021   Procedure: COLONOSCOPY WITH BIOPSY;  Surgeon: Lucilla Lame, MD;  Location: [place];  Service: Endoscopy;  Laterality: N/A;   LIPOSUCTION WITH LIPOFILLING Bilateral 03/06/2019   Procedure: Fat grafting to bilateral breast from abdomen;  Surgeon: Wallace Going, DO;  Location: [place];  Service: Plastics;  Laterality: Bilateral;  90 min, please   MASTECTOMY Bilateral    MASTECTOMY W/ SENTINEL NODE BIOPSY Bilateral 08/08/2018   Procedure: MASTECTOMY WITH SENTINEL LYMPH NODE BIOPSY;  Surgeon: Robert Bellow, MD;  Location: [facility];  Service: General;  Laterality: Bilateral;   POLYPECTOMY N/A 05/10/2021   Procedure: POLYPECTOMY;  Surgeon: Lucilla Lame, MD;  Location: [place];  Service: Endoscopy;  Laterality: N/A;   REMOVAL OF BILATERAL TISSUE EXPANDERS WITH PLACEMENT OF BILATERAL BREAST IMPLANTS Bilateral 11/28/2018   Procedure: REMOVAL OF BILATERAL TISSUE EXPANDERS WITH PLACEMENT OF BILATERAL BREAST IMPLANTS;  Surgeon: Wallace Going, DO;  Location: [place];  Service: Plastics;  Laterality: Bilateral;  120 min, please    Medications:  Current Outpatient Medications on File Prior to Visit  Medication Sig   amLODipine (NORVASC) 5 MG tablet TAKE 1 TABLET (5 MG TOTAL) BY MOUTH IN THE MORNING AND 1 TABLET AT BEDTIME.   buPROPion (WELLBUTRIN XL)  300 MG 24 hr tablet Take 300 mg by mouth daily. (Patient not taking: Reported on 02/03/2022)   tamoxifen (NOLVADEX) 20 MG tablet Take 20 mg by mouth daily.   tamoxifen (NOLVADEX) 20 MG tablet TAKE 1 TABLET BY MOUTH EVERY DAY   valsartan (DIOVAN) 40 MG tablet Take 1 tablet (40 mg total) by mouth daily.   No current facility-administered medications on file prior to visit.    Allergies:  Allergies  Allergen Reactions   Peanut-Containing Drug Products Anaphylaxis   Toradol [Ketorolac Tromethamine] Shortness Of Breath and Itching   Carrot Oil Itching and Other (See Comments)    Throat itchs   Hydrocodone     Itching, shortness of breath   Other     Green Beans    Social History:  Social History   Socioeconomic History   Marital status: Married    Spouse name: Not on file   Number of children: Not on file   Years of education: Not on file   Highest education level: Not on file  Occupational History   Not on file  Tobacco Use   Smoking status: Never   Smokeless tobacco: Never   Tobacco comments:    Social  Vaping Use   Vaping Use: Some days   Substances: Nicotine  Substance and Sexual Activity   Alcohol use: Yes   Drug use: Yes    Types: Marijuana    Comment: on occasion   Sexual activity: Not on file  Other Topics Concern   Not on file  Social History Narrative   ** Merged History Encounter **       Social Determinants of Health   Financial Resource Strain: Not on file  Food Insecurity: Not on file  Transportation Needs: Not on file  Physical Activity: Not on file  Stress: Not on file  Social Connections: Not on file  Intimate Partner Violence: Not on file   Social History   Tobacco Use  Smoking Status Never  Smokeless Tobacco Never  Tobacco  Comments   Social   Social History   Substance and Sexual Activity  Alcohol Use Yes    Family History:  Family History  Problem Relation Age of Onset   Hypertension Mother    Stroke Father 60   Breast  cancer Neg Hx    Sudden Cardiac Death Mother 31       at age 33    Past medical history, surgical history, medications, allergies, family history and social history reviewed with patient today and changes made to appropriate areas of the chart.   ROS All other ROS negative except what is listed above and in the HPI.      Objective:    LMP 09/18/2018 (Approximate) Comment: u preg negative 11/28/2018  Wt Readings from Last 3 Encounters:  02/03/22 137 lb (62.1 kg)  10/08/21 140 lb (63.5 kg)  06/02/21 142 lb (64.4 kg)    Physical Exam  Results for orders placed or performed in visit on 10/08/21  Comp Met (CMET)  Result Value Ref Range   Glucose 96 70 - 99 mg/dL   BUN 18 6 - 24 mg/dL   Creatinine, Ser 1.27 (H) 0.57 - 1.00 mg/dL   eGFR 51 (L) >59 mL/min/1.73   BUN/Creatinine Ratio 14 9 - 23   Sodium 141 134 - 144 mmol/L   Potassium 4.0 3.5 - 5.2 mmol/L   Chloride 101 96 - 106 mmol/L   CO2 27 20 - 29 mmol/L   Calcium 9.3 8.7 - 10.2 mg/dL   Total Protein 7.2 6.0 - 8.5 g/dL   Albumin 4.6 3.8 - 4.9 g/dL   Globulin, Total 2.6 1.5 - 4.5 g/dL   Albumin/Globulin Ratio 1.8 1.2 - 2.2   Bilirubin Total 0.2 0.0 - 1.2 mg/dL   Alkaline Phosphatase 71 44 - 121 IU/L   AST 21 0 - 40 IU/L   ALT 17 0 - 32 IU/L      Assessment & Plan:   Problem List Items Addressed This Visit       Cardiovascular and Mediastinum   HTN (hypertension)     Other   Malignant neoplasm of upper-outer quadrant of right breast in female, estrogen receptor positive ([place])   Depression   Other Visit Diagnoses     Annual physical exam    -  Primary   Screening for ischemic heart disease            Follow up plan: No follow-ups on file.   LABORATORY TESTING:  - Pap smear: {Blank ZOXWRU:04540::"JWJ done","not applicable","up to date","done elsewhere"}  IMMUNIZATIONS:   - Tdap: Tetanus vaccination status reviewed: {tetanus status:315746}. - Influenza: {Blank single:19197::"Up to date","Administered  today","Postponed to flu season","Refused","Given elsewhere"} - Pneumovax: {Blank single:19197::"Up to date","Administered today","Not applicable","Refused","Given elsewhere"} - Prevnar: {Blank single:19197::"Up to date","Administered today","Not applicable","Refused","Given elsewhere"} - COVID: {Blank single:19197::"Up to date","Administered today","Not applicable","Refused","Given elsewhere"} - HPV: {Blank single:19197::"Up to date","Administered today","Not applicable","Refused","Given elsewhere"} - Shingrix vaccine: {Blank single:19197::"Up to date","Administered today","Not applicable","Refused","Given elsewhere"}  SCREENING: -Mammogram: {Blank single:19197::"Up to date","Ordered today","Not applicable","Refused","Done elsewhere"}  - Colonoscopy: {Blank single:19197::"Up to date","Ordered today","Not applicable","Refused","Done elsewhere"}  - Bone Density: {Blank single:19197::"Up to date","Ordered today","Not applicable","Refused","Done elsewhere"}  -Hearing Test: {Blank single:19197::"Up to date","Ordered today","Not applicable","Refused","Done elsewhere"}  -Spirometry: {Blank single:19197::"Up to date","Ordered today","Not applicable","Refused","Done elsewhere"}   PATIENT COUNSELING:   Advised to take 1 mg of folate supplement per day if capable of pregnancy.   Sexuality: Discussed sexually transmitted diseases, partner selection, use of condoms, avoidance of unintended pregnancy  and contraceptive alternatives.   Advised  to avoid cigarette smoking.  I discussed with the patient that most people either abstain from alcohol or drink within safe limits (<=14/week and <=4 drinks/occasion for males, <=7/weeks and <= 3 drinks/occasion for females) and that the risk for alcohol disorders and other health effects rises proportionally with the number of drinks per week and how often a drinker exceeds daily limits.  Discussed cessation/primary prevention of drug use and availability of  treatment for abuse.   Diet: Encouraged to adjust caloric intake to maintain  or achieve ideal body weight, to reduce intake of dietary saturated fat and total fat, to limit sodium intake by avoiding high sodium foods and not adding table salt, and to maintain adequate dietary potassium and calcium preferably from fresh fruits, vegetables, and low-fat dairy products.    stressed the importance of regular exercise  Injury prevention: Discussed safety belts, safety helmets, smoke detector, smoking near bedding or upholstery.   Dental health: Discussed importance of regular tooth brushing, flossing, and dental visits.    NEXT PREVENTATIVE PHYSICAL DUE IN 1 YEAR. No follow-ups on file.

## 2022-04-12 ENCOUNTER — Telehealth: Payer: Self-pay | Admitting: Nurse Practitioner

## 2022-04-12 ENCOUNTER — Ambulatory Visit (INDEPENDENT_AMBULATORY_CARE_PROVIDER_SITE_OTHER): Payer: Managed Care, Other (non HMO) | Admitting: Nurse Practitioner

## 2022-04-12 ENCOUNTER — Other Ambulatory Visit: Payer: Self-pay

## 2022-04-12 ENCOUNTER — Encounter: Payer: Self-pay | Admitting: Nurse Practitioner

## 2022-04-12 VITALS — BP 153/97 | HR 60 | Temp 98.5°F | Ht 63.5 in | Wt 141.6 lb

## 2022-04-12 DIAGNOSIS — C50411 Malignant neoplasm of upper-outer quadrant of right female breast: Secondary | ICD-10-CM | POA: Diagnosis not present

## 2022-04-12 DIAGNOSIS — I10 Essential (primary) hypertension: Secondary | ICD-10-CM

## 2022-04-12 DIAGNOSIS — F32A Depression, unspecified: Secondary | ICD-10-CM

## 2022-04-12 DIAGNOSIS — Z23 Encounter for immunization: Secondary | ICD-10-CM

## 2022-04-12 DIAGNOSIS — Z0000 Encounter for general adult medical examination without abnormal findings: Secondary | ICD-10-CM | POA: Diagnosis not present

## 2022-04-12 DIAGNOSIS — Z136 Encounter for screening for cardiovascular disorders: Secondary | ICD-10-CM

## 2022-04-12 DIAGNOSIS — N1831 Chronic kidney disease, stage 3a: Secondary | ICD-10-CM | POA: Diagnosis not present

## 2022-04-12 DIAGNOSIS — Z170 Estrogen receptor positive status [ER+]: Secondary | ICD-10-CM

## 2022-04-12 LAB — URINALYSIS, ROUTINE W REFLEX MICROSCOPIC
Bilirubin, UA: NEGATIVE
Glucose, UA: NEGATIVE
Ketones, UA: NEGATIVE
Leukocytes,UA: NEGATIVE
Nitrite, UA: NEGATIVE
Protein,UA: NEGATIVE
RBC, UA: NEGATIVE
Specific Gravity, UA: 1.02 (ref 1.005–1.030)
Urobilinogen, Ur: 0.2 mg/dL (ref 0.2–1.0)
pH, UA: 7 (ref 5.0–7.5)

## 2022-04-12 MED ORDER — LISINOPRIL 20 MG PO TABS: 20.0000 mg | ORAL_TABLET | Freq: Every day | ORAL | 0 refills | Status: DC

## 2022-04-12 NOTE — Telephone Encounter (Signed)
Patient called in stating she had an appointment this morning with the provider and was told a medication was being called in and she had been contacting the pharmacy and nothing is there. She is very frustrated and want to know what is going on. Please assist patient further. The pharmacy she uses is  [id number] [place] [address] Phone:  [phone] Fax:  [phone]

## 2022-04-12 NOTE — Telephone Encounter (Signed)
Wrong pharmacy was on file for patient. Contacted patient and confirmed [facility]. Resent in medication for her. Called her back to inform that medication has been sent to [facility].

## 2022-04-12 NOTE — Assessment & Plan Note (Signed)
Ongoing.  Following up with ongoing.  Continue to follow per their recommendations.

## 2022-04-12 NOTE — Assessment & Plan Note (Signed)
Chronic. Not well controlled.  Lengthy discussion had with patient during visit regarding getting blood pressure under control and how elevated blood pressure negatively affects the kidneys.  Patient would like to change from Valsartan because she states it is causing her kidney function to decline to something else.  Will change from Valsartan to Lisinopril.  Discussed side effects and benefits of medication with patient during visit today.  Recommend checking blood pressures at home.  Follow up in 1 month for reevaluation.

## 2022-04-12 NOTE — Assessment & Plan Note (Signed)
Chronic. Lengthy discussion had with patient regarding kidney disease during office visit today.  Stressed the importance of getting blood pressure under control. Discussed benefit from ACE/ARB.  Will start Lisinopril at visit today.  Discussed that other medications can be beneficial in the setting of kidney disease such as SGLT2 but will hold off at this time due to start 1 new medication already.  Patient declines kidney ultrasound recommended by Nephrology.  Also declines going back to Nephrology.  Follow up in 1 month and will recheck blood pressure and CMP to see how kidneys are responding to ACE.

## 2022-04-13 LAB — CBC WITH DIFFERENTIAL/PLATELET
Basophils Absolute: 0.1 10*3/uL (ref 0.0–0.2)
Basos: 1 %
EOS (ABSOLUTE): 0.1 10*3/uL (ref 0.0–0.4)
Eos: 3 %
Hematocrit: 37.8 % (ref 34.0–46.6)
Hemoglobin: 12.1 g/dL (ref 11.1–15.9)
Immature Grans (Abs): 0 10*3/uL (ref 0.0–0.1)
Immature Granulocytes: 0 %
Lymphocytes Absolute: 1.5 10*3/uL (ref 0.7–3.1)
Lymphs: 28 %
MCH: 27.9 pg (ref 26.6–33.0)
MCHC: 32 g/dL (ref 31.5–35.7)
MCV: 87 fL (ref 79–97)
Monocytes Absolute: 0.4 10*3/uL (ref 0.1–0.9)
Monocytes: 8 %
Neutrophils Absolute: 3.3 10*3/uL (ref 1.4–7.0)
Neutrophils: 60 %
Platelets: 213 10*3/uL (ref 150–450)
RBC: 4.34 x10E6/uL (ref 3.77–5.28)
RDW: 14 % (ref 11.7–15.4)
WBC: 5.4 10*3/uL (ref 3.4–10.8)

## 2022-04-13 LAB — COMPREHENSIVE METABOLIC PANEL
ALT: 14 IU/L (ref 0–32)
AST: 19 IU/L (ref 0–40)
Albumin/Globulin Ratio: 1.9 (ref 1.2–2.2)
Albumin: 4.5 g/dL (ref 3.8–4.9)
Alkaline Phosphatase: 74 IU/L (ref 44–121)
BUN/Creatinine Ratio: 11 (ref 9–23)
BUN: 10 mg/dL (ref 6–24)
Bilirubin Total: 0.2 mg/dL (ref 0.0–1.2)
CO2: 22 mmol/L (ref 20–29)
Calcium: 9.6 mg/dL (ref 8.7–10.2)
Chloride: 103 mmol/L (ref 96–106)
Creatinine, Ser: 0.9 mg/dL (ref 0.57–1.00)
Globulin, Total: 2.4 g/dL (ref 1.5–4.5)
Glucose: 95 mg/dL (ref 70–99)
Potassium: 4.1 mmol/L (ref 3.5–5.2)
Sodium: 143 mmol/L (ref 134–144)
Total Protein: 6.9 g/dL (ref 6.0–8.5)
eGFR: 77 mL/min/{1.73_m2} (ref >59)

## 2022-04-13 LAB — LIPID PANEL
Chol/HDL Ratio: 2.1 ratio (ref 0.0–4.4)
Cholesterol, Total: 150 mg/dL (ref 100–199)
HDL: 73 mg/dL (ref >39)
LDL Chol Calc (NIH): 61 mg/dL (ref 0–99)
Triglycerides: 82 mg/dL (ref 0–149)
VLDL Cholesterol Cal: 16 mg/dL (ref 5–40)

## 2022-04-13 LAB — TSH: TSH: 0.862 u[IU]/mL (ref 0.450–4.500)

## 2022-04-13 NOTE — Progress Notes (Signed)
Please let patient know that her lab work shows that her anemia improved from prior back to the normal range.    Her Kidney function also improved from prior which is great news.    Cholesterol and thyroid labs are within normal limites.  Follow up as discussed.

## 2022-05-04 ENCOUNTER — Other Ambulatory Visit: Payer: Self-pay | Admitting: Nurse Practitioner

## 2022-05-05 NOTE — Telephone Encounter (Signed)
Requested Prescriptions  Pending Prescriptions Disp Refills  . lisinopril (ZESTRIL) 20 MG tablet [Pharmacy Med Name: LISINOPRIL 20 MG TABLET] 30 tablet 0    Sig: TAKE 1 TABLET BY MOUTH EVERY DAY     Cardiovascular:  ACE Inhibitors Failed - 05/04/2022 11:30 AM      Failed - Last BP in normal range    BP Readings from Last 1 Encounters:  04/12/22 (!) 153/97         Passed - Cr in normal range and within 180 days    Creatinine, Ser  Date Value Ref Range Status  04/12/2022 0.90 0.57 - 1.00 mg/dL Final         Passed - K in normal range and within 180 days    Potassium  Date Value Ref Range Status  04/12/2022 4.1 3.5 - 5.2 mmol/L Final         Passed - Patient is not pregnant      Passed - Valid encounter within last 6 months    Recent Outpatient Visits          3 weeks ago Annual physical exam   [place], NP   6 months ago Essential hypertension   [facility] Jon Billings, NP   1 year ago Essential hypertension   [facility] Jon Billings, NP   1 year ago Essential hypertension   [facility] Jon Billings, NP   1 year ago Dizziness   [place], Jolene T, NP      Future Appointments            In 1 week Jon Billings, NP [facility] [place], [place]

## 2022-05-13 ENCOUNTER — Ambulatory Visit: Payer: Managed Care, Other (non HMO) | Admitting: Nurse Practitioner

## 2022-05-24 ENCOUNTER — Ambulatory Visit: Payer: Managed Care, Other (non HMO) | Admitting: Nurse Practitioner

## 2022-07-07 ENCOUNTER — Ambulatory Visit: Payer: Managed Care, Other (non HMO) | Admitting: Physician Assistant

## 2022-07-11 ENCOUNTER — Ambulatory Visit: Payer: Managed Care, Other (non HMO) | Admitting: Surgical

## 2022-11-23 ENCOUNTER — Other Ambulatory Visit: Payer: Self-pay | Admitting: Nurse Practitioner

## 2022-11-23 NOTE — Telephone Encounter (Signed)
Requested medication (s) are due for refill today: yes  Requested medication (s) are on the active medication list: yes  Last refill:  05/05/22 #90 1 RF  Future visit scheduled: no  Notes to clinic:  called pt to make appt /LM to VM to call office back   Requested Prescriptions  Pending Prescriptions Disp Refills   lisinopril (ZESTRIL) 20 MG tablet [Pharmacy Med Name: LISINOPRIL 20 MG TABLET] 90 tablet 1    Sig: TAKE 1 TABLET BY MOUTH EVERY DAY     Cardiovascular:  ACE Inhibitors Failed - 11/23/2022  1:34 AM      Failed - Cr in normal range and within 180 days    Creatinine, Ser  Date Value Ref Range Status  04/12/2022 0.90 0.57 - 1.00 mg/dL Final         Failed - K in normal range and within 180 days    Potassium  Date Value Ref Range Status  04/12/2022 4.1 3.5 - 5.2 mmol/L Final         Failed - Last BP in normal range    BP Readings from Last 1 Encounters:  04/12/22 (!) 153/97         Failed - Valid encounter within last 6 months    Recent Outpatient Visits           7 months ago Annual physical exam   [facility] Jon Billings, NP   1 year ago Essential hypertension   [place] Jon Billings, NP   1 year ago Essential hypertension   [place] Jon Billings, NP   1 year ago Essential hypertension   [place] Jon Billings, NP   2 years ago [place], Barbaraann Faster, NP              Passed - Patient is not pregnant

## 2023-01-08 ENCOUNTER — Other Ambulatory Visit: Payer: Self-pay | Admitting: Oncology

## 2023-02-06 ENCOUNTER — Inpatient Hospital Stay: Payer: Managed Care, Other (non HMO) | Admitting: Nurse Practitioner

## 2023-02-06 ENCOUNTER — Ambulatory Visit: Payer: Managed Care, Other (non HMO) | Admitting: Medical Oncology

## 2023-02-09 ENCOUNTER — Encounter: Payer: Self-pay | Admitting: Oncology

## 2023-02-09 ENCOUNTER — Inpatient Hospital Stay: Payer: Managed Care, Other (non HMO) | Attending: Nurse Practitioner | Admitting: Nurse Practitioner

## 2023-02-17 ENCOUNTER — Other Ambulatory Visit: Payer: Self-pay | Admitting: Nurse Practitioner

## 2023-02-17 NOTE — Telephone Encounter (Signed)
Unable to refill per protocol, appointment needed.   Requested Prescriptions  Pending Prescriptions Disp Refills   amLODipine (NORVASC) 5 MG tablet [Pharmacy Med Name: AMLODIPINE BESYLATE 5 MG TAB] 180 tablet 1    Sig: TAKE 1 TABLET (5 MG TOTAL) BY MOUTH IN THE MORNING AND 1 TABLET AT BEDTIME.     Cardiovascular: Calcium Channel Blockers 2 Failed - 02/17/2023 11:06 AM      Failed - Last BP in normal range    BP Readings from Last 1 Encounters:  04/12/22 (!) 153/97         Failed - Valid encounter within last 6 months    Recent Outpatient Visits           10 months ago Annual physical exam   [place] [facility] Larae Grooms, NP   1 year ago Essential hypertension   [place] [facility] Larae Grooms, NP   1 year ago Essential hypertension   [place] [facility] Larae Grooms, NP   1 year ago Essential hypertension   [place] [facility] [place], Clydie Braun, NP   2 years ago Dizziness   [place] [facility] [place], [place] T, NP              Passed - Last Heart Rate in normal range    Pulse Readings from Last 1 Encounters:  04/12/22 60          lisinopril (ZESTRIL) 20 MG tablet [Pharmacy Med Name: LISINOPRIL 20 MG TABLET] 90 tablet 1    Sig: TAKE 1 TABLET BY MOUTH EVERY DAY     Cardiovascular:  ACE Inhibitors Failed - 02/17/2023 11:06 AM      Failed - Cr in normal range and within 180 days    Creatinine, Ser  Date Value Ref Range Status  04/12/2022 0.90 0.57 - 1.00 mg/dL Final         Failed - K in normal range and within 180 days    Potassium  Date Value Ref Range Status  04/12/2022 4.1 3.5 - 5.2 mmol/L Final         Failed - Last BP in normal range    BP Readings from Last 1 Encounters:  04/12/22 (!) 153/97         Failed - Valid encounter within last 6 months    Recent Outpatient Visits           10 months ago Annual physical exam   [place] [facility] Larae Grooms, NP   1 year ago Essential hypertension   [place] [facility] Larae Grooms, NP   1 year ago Essential hypertension   [place] [facility] Larae Grooms, NP   1 year ago Essential hypertension   [place] [facility] Larae Grooms, NP   2 years ago Dizziness   [facility] [place], Dorie Rank, NP              Passed - Patient is not pregnant

## 2023-03-01 ENCOUNTER — Other Ambulatory Visit: Payer: Self-pay | Admitting: Nurse Practitioner

## 2023-03-01 NOTE — Telephone Encounter (Signed)
Requested Prescriptions  Pending Prescriptions Disp Refills   lisinopril (ZESTRIL) 20 MG tablet [Pharmacy Med Name: LISINOPRIL 20 MG TABLET] 90 tablet 1    Sig: TAKE 1 TABLET BY MOUTH EVERY DAY     Cardiovascular:  ACE Inhibitors Failed - 03/01/2023 11:12 AM      Failed - Cr in normal range and within 180 days    Creatinine, Ser  Date Value Ref Range Status  04/12/2022 0.90 0.57 - 1.00 mg/dL Final         Failed - K in normal range and within 180 days    Potassium  Date Value Ref Range Status  04/12/2022 4.1 3.5 - 5.2 mmol/L Final         Failed - Last BP in normal range    BP Readings from Last 1 Encounters:  04/12/22 (!) 153/97         Failed - Valid encounter within last 6 months    Recent Outpatient Visits           10 months ago Annual physical exam   [place] [facility] Larae Grooms, NP   1 year ago Essential hypertension   [place] [facility] Larae Grooms, NP   1 year ago Essential hypertension   [place] [facility] Larae Grooms, NP   1 year ago Essential hypertension   [place] [facility] Larae Grooms, NP   2 years ago Dizziness   [place] [facility] Marjie Skiff, NP       Future Appointments             In 3 weeks Larae Grooms, NP [place] [facility]            Passed - Patient is not pregnant

## 2023-03-24 ENCOUNTER — Ambulatory Visit: Payer: Managed Care, Other (non HMO) | Admitting: Nurse Practitioner

## 2023-03-24 NOTE — Progress Notes (Deleted)
LMP 09/18/2018 (Approximate) Comment: u preg negative 11/28/2018   Subjective:    Patient ID: Cassandra Tran, female    DOB: 1970/10/17, 52 y.o.   MRN: [id number]  HPI: Cassandra Tran is a 52 y.o. female  No chief complaint on file.  HYPERTENSION Hypertension status: controlled  Satisfied with current treatment? yes Duration of hypertension: years BP monitoring frequency:  daily BP range: 120/80 BP medication side effects:  no Medication compliance: excellent compliance Previous BP meds:amlodipine Aspirin: no Recurrent headaches: no Visual changes: no Palpitations: no Dyspnea: no Chest pain: no Lower extremity edema: no Dizzy/lightheaded: no  CHRONIC KIDNEY DISEASE CKD status: {Blank single:19197::"controlled","uncontrolled","better","worse","exacerbated","stable"} Medications renally dose: {Blank single:19197::"yes","no"} Previous renal evaluation: {Blank single:19197::"yes","no"} Pneumovax:  {Blank single:19197::"Up to Date","Not up to Date","unknown"} Influenza Vaccine:  {Blank single:19197::"Up to Date","Not up to Date","unknown"}   NUMBNESS Patient states she has been having numbness and pain on the first three fingers of her right hand. She does a lot of typing at work.  Now when she uses her mouse at work it hurts.    Relevant past medical, surgical, family and social history reviewed and updated as indicated. Interim medical history since our last visit reviewed. Allergies and medications reviewed and updated.  Review of Systems  Eyes:  Negative for visual disturbance.  Respiratory:  Negative for cough, chest tightness and shortness of breath.   Cardiovascular:  Negative for chest pain, palpitations and leg swelling.  Musculoskeletal:        Right arm pain  Neurological:  Positive for numbness. Negative for dizziness and headaches.    Per HPI unless specifically indicated above     Objective:    LMP 09/18/2018 (Approximate) Comment: u preg negative  11/28/2018  Wt Readings from Last 3 Encounters:  04/12/22 141 lb 9.6 oz (64.2 kg)  02/03/22 137 lb (62.1 kg)  10/08/21 140 lb (63.5 kg)    Physical Exam Vitals and nursing note reviewed.  Constitutional:      General: She is not in acute distress.    Appearance: Normal appearance. She is normal weight. She is not ill-appearing, toxic-appearing or diaphoretic.  HENT:     Head: Normocephalic.     Right Ear: External ear normal.     Left Ear: External ear normal.     Nose: Nose normal.     Mouth/Throat:     Mouth: Mucous membranes are moist.     Pharynx: Oropharynx is clear.  Eyes:     General:        Right eye: No discharge.        Left eye: No discharge.     Extraocular Movements: Extraocular movements intact.     Conjunctiva/sclera: Conjunctivae normal.     Pupils: Pupils are equal, round, and reactive to light.  Cardiovascular:     Rate and Rhythm: Normal rate and regular rhythm.     Heart sounds: No murmur heard. Pulmonary:     Effort: Pulmonary effort is normal. No respiratory distress.     Breath sounds: Normal breath sounds. No wheezing or rales.  Musculoskeletal:     Cervical back: Normal range of motion and neck supple.  Skin:    General: Skin is warm and dry.     Capillary Refill: Capillary refill takes less than 2 seconds.  Neurological:     General: No focal deficit present.     Mental Status: She is alert and oriented to person, place, and time. Mental status is at baseline.  Psychiatric:  Mood and Affect: Mood normal.        Behavior: Behavior normal.        Thought Content: Thought content normal.        Judgment: Judgment normal.     Results for orders placed or performed in visit on 04/12/22  CBC with Differential/Platelet  Result Value Ref Range   WBC 5.4 3.4 - 10.8 x10E3/uL   RBC 4.34 3.77 - 5.28 x10E6/uL   Hemoglobin 12.1 11.1 - 15.9 g/dL   Hematocrit 40.9 81.1 - 46.6 %   MCV 87 79 - 97 fL   MCH 27.9 26.6 - 33.0 pg   MCHC 32.0 31.5 -  35.7 g/dL   RDW 91.4 78.2 - 95.6 %   Platelets 213 150 - 450 x10E3/uL   Neutrophils 60 Not Estab. %   Lymphs 28 Not Estab. %   Monocytes 8 Not Estab. %   Eos 3 Not Estab. %   Basos 1 Not Estab. %   Neutrophils Absolute 3.3 1.4 - 7.0 x10E3/uL   Lymphocytes Absolute 1.5 0.7 - 3.1 x10E3/uL   Monocytes Absolute 0.4 0.1 - 0.9 x10E3/uL   EOS (ABSOLUTE) 0.1 0.0 - 0.4 x10E3/uL   Basophils Absolute 0.1 0.0 - 0.2 x10E3/uL   Immature Granulocytes 0 Not Estab. %   Immature Grans (Abs) 0.0 0.0 - 0.1 x10E3/uL  Comprehensive metabolic panel  Result Value Ref Range   Glucose 95 70 - 99 mg/dL   BUN 10 6 - 24 mg/dL   Creatinine, Ser 2.13 0.57 - 1.00 mg/dL   eGFR 77 >08 MV/HQI/6.96   BUN/Creatinine Ratio 11 9 - 23   Sodium 143 134 - 144 mmol/L   Potassium 4.1 3.5 - 5.2 mmol/L   Chloride 103 96 - 106 mmol/L   CO2 22 20 - 29 mmol/L   Calcium 9.6 8.7 - 10.2 mg/dL   Total Protein 6.9 6.0 - 8.5 g/dL   Albumin 4.5 3.8 - 4.9 g/dL   Globulin, Total 2.4 1.5 - 4.5 g/dL   Albumin/Globulin Ratio 1.9 1.2 - 2.2   Bilirubin Total 0.2 0.0 - 1.2 mg/dL   Alkaline Phosphatase 74 44 - 121 IU/L   AST 19 0 - 40 IU/L   ALT 14 0 - 32 IU/L  Lipid panel  Result Value Ref Range   Cholesterol, Total 150 100 - 199 mg/dL   Triglycerides 82 0 - 149 mg/dL   HDL 73 >29 mg/dL   VLDL Cholesterol Cal 16 5 - 40 mg/dL   LDL Chol Calc (NIH) 61 0 - 99 mg/dL   Chol/HDL Ratio 2.1 0.0 - 4.4 ratio  TSH  Result Value Ref Range   TSH 0.862 0.450 - 4.500 uIU/mL  Urinalysis, Routine w reflex microscopic  Result Value Ref Range   Specific Gravity, UA 1.020 1.005 - 1.030   pH, UA 7.0 5.0 - 7.5   Color, UA Yellow Yellow   Appearance Ur Clear Clear   Leukocytes,UA Negative Negative   Protein,UA Negative Negative/Trace   Glucose, UA Negative Negative   Ketones, UA Negative Negative   RBC, UA Negative Negative   Bilirubin, UA Negative Negative   Urobilinogen, Ur 0.2 0.2 - 1.0 mg/dL   Nitrite, UA Negative Negative       Assessment & Plan:   Problem List Items Addressed This Visit       Cardiovascular and Mediastinum   Essential hypertension - Primary     Genitourinary   Chronic kidney disease, stage 3a (HCC)  Other   Depression     Follow up plan: No follow-ups on file.

## 2023-04-05 ENCOUNTER — Other Ambulatory Visit: Payer: Self-pay | Admitting: Nurse Practitioner

## 2023-04-05 NOTE — Telephone Encounter (Signed)
Last RF courtesy- no showed appointment Requested Prescriptions  Pending Prescriptions Disp Refills   lisinopril (ZESTRIL) 20 MG tablet [Pharmacy Med Name: LISINOPRIL 20 MG TABLET] 90 tablet 1    Sig: TAKE 1 TABLET BY MOUTH EVERY DAY     Cardiovascular:  ACE Inhibitors Failed - 04/05/2023  8:30 AM      Failed - Cr in normal range and within 180 days    Creatinine, Ser  Date Value Ref Range Status  04/12/2022 0.90 0.57 - 1.00 mg/dL Final         Failed - K in normal range and within 180 days    Potassium  Date Value Ref Range Status  04/12/2022 4.1 3.5 - 5.2 mmol/L Final         Failed - Last BP in normal range    BP Readings from Last 1 Encounters:  04/12/22 (!) 153/97         Failed - Valid encounter within last 6 months    Recent Outpatient Visits           11 months ago Annual physical exam   [place] [facility] Larae [facility]rooms, NP   1 year ago Essential hypertension   [place] [facility] Larae Grooms, NP   1 year ago Essential hypertension   [place] [facility] Larae Grooms, NP   1 year ago Essential hypertension   [place] [facility] Larae Grooms, NP   2 years ago Dizziness   [place] [facility] [place], Dorie Rank, NP              Passed - Patient is not pregnant

## 2023-04-20 ENCOUNTER — Other Ambulatory Visit: Payer: Self-pay | Admitting: Nurse Practitioner

## 2023-04-21 NOTE — Telephone Encounter (Signed)
Requested medications are due for refill today.  yes  Requested medications are on the active medications list.  yes  Last refill. 03/01/2023 #30 0 rf  Future visit scheduled.   no  Notes to clinic.  Pt is more than 3 months overdue for OV. Labs are expired. Pt already given a courtesy refill.    Requested Prescriptions  Pending Prescriptions Disp Refills   lisinopril (ZESTRIL) 20 MG tablet [Pharmacy Med Name: LISINOPRIL 20 MG TABLET] 30 tablet 0    Sig: TAKE 1 TABLET BY MOUTH EVERY DAY     Cardiovascular:  ACE Inhibitors Failed - 04/20/2023  1:43 PM      Failed - Cr in normal range and within 180 days    Creatinine, Ser  Date Value Ref Range Status  04/12/2022 0.90 0.57 - 1.00 mg/dL Final         Failed - K in normal range and within 180 days    Potassium  Date Value Ref Range Status  04/12/2022 4.1 3.5 - 5.2 mmol/L Final         Failed - Last BP in normal range    BP Readings from Last 1 Encounters:  04/12/22 (!) 153/97         Failed - Valid encounter within last 6 months    Recent Outpatient Visits           1 year ago Annual physical exam   [place] [facility] Larae Grooms, NP   1 year ago Essential hypertension   [place] [facility] Larae Grooms, NP   1 year ago Essential hypertension   [place] [facility] Larae Grooms, NP   2 years ago Essential hypertension   [place] [facility] Larae Grooms, NP   2 years ago Dizziness   [place] [facility] [place], Dorie Rank, NP              Passed - Patient is not pregnant

## 2023-05-01 ENCOUNTER — Telehealth: Payer: Self-pay

## 2023-05-01 ENCOUNTER — Ambulatory Visit: Payer: Self-pay

## 2023-05-01 ENCOUNTER — Ambulatory Visit: Payer: 59 | Admitting: Physician Assistant

## 2023-05-01 ENCOUNTER — Encounter: Payer: Self-pay | Admitting: Physician Assistant

## 2023-05-01 ENCOUNTER — Other Ambulatory Visit: Payer: Self-pay | Admitting: Nurse Practitioner

## 2023-05-01 VITALS — BP 184/112 | HR 54 | Wt 134.2 lb

## 2023-05-01 DIAGNOSIS — I10 Essential (primary) hypertension: Secondary | ICD-10-CM | POA: Diagnosis not present

## 2023-05-01 MED ORDER — LISINOPRIL 20 MG PO TABS: 20.0000 mg | ORAL_TABLET | Freq: Every day | ORAL | 0 refills | Status: DC

## 2023-05-01 MED ORDER — AMLODIPINE BESYLATE 5 MG PO TABS: 5.0000 mg | ORAL_TABLET | Freq: Every day | ORAL | 1 refills | Status: DC

## 2023-05-01 NOTE — Telephone Encounter (Signed)
  Chief Complaint: Not told she should be taking 2 HTN medications. Medications not at pharmacy Symptoms: HTN Frequency: ongoing Pertinent Negatives: Patient denies  Disposition: [] ED /[] Urgent Care (no appt availability in office) / [] Appointment(In office/virtual)/ []  [place] [facility]/ [] Home Care/ [] Refused Recommended Disposition /[] [place] [facility]/ [x]  Follow-up with PCP Additional Notes: Call from pt, she is very upset regarding HTN medications. Pt states that she was never told that she should be taking 2 HTN medications. Pt has not been taking lisinopril.  Pt also states that medications were not called in to pharmacy today.   Called pharmacy, rxs received.  Pt would like a new PCP. Please advise.     [facility] and will have begun filling.  Reason for Disposition  [1] Caller requesting NON-URGENT health information AND [2] PCP's office is the best resource  Answer Assessment - Initial Assessment Questions 1. REASON FOR CALL or QUESTION: "What is your reason for calling today?" or "How can I best help you?" or "What question do you have that I can help answer?"     Missing medication. Pt would like to change provider.  Protocols used: Information Only Call - No Triage-A-AH

## 2023-05-01 NOTE — Assessment & Plan Note (Addendum)
Chronic, historic condition She has not been taking Lisinopril and states she has only been taking Amlodipine 5 mg PO BID. She reports confusion over her regimen and states she has never taken 2 BP medications before Reviewed result notes from previous and it appears she was changed to Lisinopril from Valsartan about a year ago but she denies recollection of this  Will send refills with instructions for Lisinopril 20 mg PO every day and Amlodipine 5 mg PO every day, may need adjustment at follow up Reviewed that medications could be taken in AM or PM per preference as long as she is consistent with admin I asked patient several times throughout the apt if she had concerns or follow up questions but she denied having any that were not previously answered.  Follow up in about 4 weeks to assess response and check BP

## 2023-05-01 NOTE — Patient Instructions (Signed)
Your blood pressure was elevated today.  ?If possible please take it at home using an electronic blood pressure cuff for the upper arm ?Record your blood pressure once per day and bring them back with you to your apt so we can make sure you are not developing high blood pressure.  ? ?Incorporating a minimum of 150 minutes (20-30 minutes per day) of moderate intensity physical activity can help improve your heart health and reduce the chances of high blood pressure and other cardiovascular risks. ?Incorporating a heart healthy diet can also help reduce the chances of heart attack and high cholesterol. ? ?

## 2023-05-01 NOTE — Progress Notes (Signed)
Acute Office Visit   Patient: Cassandra Tran   DOB: Jul 15, 1971   52 y.o. Female  MRN: [id number] Visit Date: 05/01/2023  Today's healthcare provider: Oswaldo Conroy Joani Cosma, PA-C  Introduced myself to the patient as a Secondary school teacher and provided education on APPs in clinical practice.    Chief Complaint  Patient presents with   Hypertension    Patient says she has been without her medication for a month. Patient is here today requesting refills on her medication.    Subjective    HPI HPI     Hypertension    Additional comments: Patient says she has been without her medication for a month. Patient is here today requesting refills on her medication.       Last edited by Malen Gauze, CMA on 05/01/2023  9:26 AM.        Hypertension: - Medications: Amlodipine 5 mg PO BID  - Compliance: she has been out of medication for about a month  - Checking BP at home: She checks at home and has been getting similar results as office vitals  - Denies any SOB, CP, vision changes, LE edema, medication SEs, or symptoms of hypotension - Diet: She has been taking beets      Medications: Outpatient Medications Prior to Visit  Medication Sig   tamoxifen (NOLVADEX) 20 MG tablet TAKE 1 TABLET BY MOUTH EVERY DAY   [DISCONTINUED] amLODipine (NORVASC) 5 MG tablet TAKE 1 TABLET (5 MG TOTAL) BY MOUTH IN THE MORNING AND 1 TABLET AT BEDTIME.   [DISCONTINUED] lisinopril (ZESTRIL) 20 MG tablet TAKE 1 TABLET BY MOUTH EVERY DAY (Patient not taking: Reported on 05/01/2023)   No facility-administered medications prior to visit.    Review of Systems  Eyes:  Negative for visual disturbance.  Respiratory:  Negative for shortness of breath and wheezing.   Cardiovascular:  Negative for chest pain, palpitations and leg swelling.  Neurological:  Negative for dizziness, light-headedness and headaches.         Objective    BP (!) 184/112   Pulse (!) 54   Wt 134 lb 3.2 oz (60.9 kg)   LMP 09/18/2018  (Approximate) Comment: u preg negative 11/28/2018  SpO2 100%   BMI 23.40 kg/m      Physical Exam Vitals reviewed.  Constitutional:      General: She is awake.     Appearance: Normal appearance. She is well-developed and well-groomed.  HENT:     Head: Normocephalic and atraumatic.  Cardiovascular:     Rate and Rhythm: Normal rate and regular rhythm.     Pulses: Normal pulses.          Radial pulses are 2+ on the right side and 2+ on the left side.     Heart sounds: Normal heart sounds. No murmur heard.    No friction rub. No gallop.  Pulmonary:     Effort: Pulmonary effort is normal.     Breath sounds: Normal breath sounds. No decreased air movement. No decreased breath sounds, wheezing, rhonchi or rales.  Musculoskeletal:     Right lower leg: No edema.     Left lower leg: No edema.  Neurological:     Mental Status: She is alert.  Psychiatric:        Behavior: Behavior is cooperative.       No results found for any visits on 05/01/23.  Assessment & Plan      Return in about 4 weeks (  around 05/29/2023) for HTN.       Problem List Items Addressed This Visit       Cardiovascular and Mediastinum   Essential hypertension - Primary    Chronic, historic condition She has not been taking Lisinopril and states she has only been taking Amlodipine 5 mg PO BID. She reports confusion over her regimen and states she has never taken 2 BP medications before Reviewed result notes from previous and it appears she was changed to Lisinopril from Valsartan about a year ago but she denies recollection of this  Will send refills with instructions for Lisinopril 20 mg PO every day and Amlodipine 5 mg PO every day, may need adjustment at follow up Reviewed that medications could be taken in AM or PM per preference as long as she is consistent with admin I asked patient several times throughout the apt if she had concerns or follow up questions but she denied having any that were not  previously answered.  Follow up in about 4 weeks to assess response and check BP         Relevant Medications   lisinopril (ZESTRIL) 20 MG tablet   amLODipine (NORVASC) 5 MG tablet     Return in about 4 weeks (around 05/29/2023) for HTN.   I, Cuyler Vandyken E Rogenia Werntz, PA-C, have reviewed all documentation for this visit. The documentation on 05/01/23 for the exam, diagnosis, procedures, and orders are all accurate and complete.   Jacquelin Hawking, MHS, PA-C [facility]

## 2023-05-01 NOTE — Telephone Encounter (Signed)
Contacted patient and discussed her concerns regarding her blood pressure medications. Along with Cassandra Tran, CMA advised patient that she should be taking Lisinopril and Amlodipine as indicated by Cassandra Grooms, NP on 04/12/22. Patient stated that she did not realize she was supposed to be taking both blood pressure medications before today. Patient states that she would prefer to no longer see an APP due to the confusion and would prefer to see [name] because her husband sees her and is incredibly happy with his care. Patient's appointment was scheduled with [name] on 05/29/23 @ 10:40am.

## 2023-05-02 NOTE — Telephone Encounter (Signed)
Erroneous encounter

## 2023-05-02 NOTE — Telephone Encounter (Signed)
[facility] and after multiple attempts to contact pharmacy staff, unable to speak with a live person. AI very difficult to allow NT to get requested information. Did not leave message.

## 2023-05-02 NOTE — Telephone Encounter (Signed)
Requested by interface surescripts. Receipt confirmed by pharmacy 05/01/23 at 9:59 am. Attempted to contact pharmacy and unable to speak with pharmacy staff due to AI issues and not allowing to speak with staff. Did not leave message.  Requested Prescriptions  Refused Prescriptions Disp Refills   lisinopril (ZESTRIL) 20 MG tablet [Pharmacy Med Name: LISINOPRIL 20 MG TABLET] 30 tablet     Sig: TAKE 1 TABLET BY MOUTH EVERY DAY     Cardiovascular:  ACE Inhibitors Failed - 05/01/2023 12:36 PM      Failed - Cr in normal range and within 180 days    Creatinine, Ser  Date Value Ref Range Status  04/12/2022 0.90 0.57 - 1.00 mg/dL Final         Failed - K in normal range and within 180 days    Potassium  Date Value Ref Range Status  04/12/2022 4.1 3.5 - 5.2 mmol/L Final         Failed - Last BP in normal range    BP Readings from Last 1 Encounters:  05/01/23 (!) 184/112         Passed - Patient is not pregnant      Passed - Valid encounter within last 6 months    Recent Outpatient Visits           Yesterday Essential hypertension   [place] [facility] Mecum, Oswaldo Conroy, PA-C   1 year ago Annual physical exam   [place] [facility] Larae Grooms, NP   1 year ago Essential hypertension   [place] [facility] Larae Grooms, NP   2 years ago Essential hypertension   [place] [facility] Larae Grooms, NP   2 years ago Essential hypertension   [place] [facility] Larae Grooms, NP       Future Appointments             In 3 weeks Laural Benes, Oralia Rud, DO  [facility]

## 2023-05-29 ENCOUNTER — Ambulatory Visit: Payer: 59 | Admitting: Family Medicine

## 2023-06-08 ENCOUNTER — Ambulatory Visit: Payer: 59 | Admitting: Family Medicine

## 2023-06-15 ENCOUNTER — Encounter: Payer: 59 | Admitting: Family Medicine

## 2023-06-28 ENCOUNTER — Encounter: Payer: 59 | Admitting: Pediatrics

## 2023-07-09 ENCOUNTER — Ambulatory Visit
Admission: EM | Admit: 2023-07-09 | Discharge: 2023-07-09 | Disposition: A | Discharge status: Home / Self Care | Payer: 59

## 2023-07-09 DIAGNOSIS — H1132 Conjunctival hemorrhage, left eye: Secondary | ICD-10-CM

## 2023-07-09 NOTE — ED Provider Notes (Signed)
[facility]    CSN: [id number] Arrival date & time: 07/09/23  1023      History   Chief Complaint Chief Complaint  Patient presents with   Eye Problem    HPI Cassandra Tran is a 52 y.o. female.   HPI  52 year old female with a past medical history significant for hypertension and ductal carcinoma in situ of left breast presents for evaluation of redness and irritation to her left eye that started 2 days ago.  She denies any itching or change in her vision.  She has had some watering from her eye.  She denies any trauma and she denies rubbing her eyes.  Past Medical History:  Diagnosis Date   BRCA negative 12/26/2017   [facility]    Breast cancer (HCC)    Cancer (HCC) 11/20/2017   DUCTAL CARCINOMA IN SITU left breast   HTN (hypertension)    Hypertension    Tobacco abuse     Patient Active Problem List   Diagnosis Date Noted   Chronic kidney disease, stage 3a (HCC) 06/02/2021   Special screening for malignant neoplasms, colon    Polyp of sigmoid colon    Elevated serum creatinine 04/28/2021   Intestinal infection due to enteropathogenic E. coli    Nausea vomiting and diarrhea    Enterocolitis 02/05/2021   Sepsis (HCC) 02/05/2021   Depression 02/05/2021   AKI (acute kidney injury) (HCC) 02/05/2021   Tobacco abuse    Breast cancer (HCC)    Dizziness 09/21/2020   Overweight (BMI 25.0-29.9) 12/31/2019   Breast asymmetry following reconstructive surgery 02/11/2019   Acquired absence of breast and absent nipple, bilateral 11/23/2018   S/P breast reconstruction, bilateral 08/14/2018   Malignant neoplasm of upper-outer quadrant of right breast in female, estrogen receptor positive (HCC) 08/02/2018   Ductal carcinoma in situ (DCIS) of left breast 11/22/2017   Essential hypertension 10/20/2017    Past Surgical History:  Procedure Laterality Date   BREAST BIOPSY Right 02/21/2014   upper-outer calcs=atypical ductal hyperplasia and background of columnar cell  change w/ calcifications. cytologic atypia per rad report from [place].   BREAST BIOPSY Right 02/21/2014   Central Right calcs=atypical ductal hyperplasia with atypical lobular hyperplasia in a background of columnar cell change with calcifications and papillary apocrine metaplasia per rad report from [place]   BREAST BIOPSY Left 11/20/2017   Affirm Bx- DUCTAL CARCINOMA IN SITU   BREAST BIOPSY Right 11/20/2017   Affirm Bx of 2 areas-  ATYPICAL DUCTAL HYPERPLASIA and PSEUDO-ANGIOMATOUS STROMAL HYPERPLASIA   BREAST BIOPSY Bilateral 05/18/2018   Procedure: BREAST BIOPSY WITH NEEDLE LOCALIZATION;  Surgeon: Earline Mayotte, MD;  Location: [facility];  Service: General;  Laterality: Bilateral;   BREAST EXCISIONAL BIOPSY Right 2015   "Benign" per pt   BREAST EXCISIONAL BIOPSY Left 05/18/2018   NL for  lumpectomy - DCIS   BREAST EXCISIONAL BIOPSY Right 05/18/2018   NL for lumpectomy for 2 areas of atypical ductal hyperplasia    BREAST RECONSTRUCTION WITH PLACEMENT OF TISSUE EXPANDER AND FLEX HD (ACELLULAR HYDRATED DERMIS) Bilateral 08/08/2018   Procedure: BREAST RECONSTRUCTION WITH PLACEMENT OF TISSUE EXPANDER AND FLEX HD (ACELLULAR HYDRATED DERMIS);  Surgeon: Peggye Form, DO;  Location: [facility];  Service: Plastics;  Laterality: Bilateral;   CESAREAN SECTION     COLONOSCOPY N/A 05/10/2021   Procedure: COLONOSCOPY WITH BIOPSY;  Surgeon: Midge Minium, MD;  Location: [facility];  Service: Endoscopy;  Laterality: N/A;   LIPOSUCTION WITH LIPOFILLING Bilateral 03/06/2019  Procedure: Fat grafting to bilateral breast from abdomen;  Surgeon: Peggye Form, DO;  Location: [facility];  Service: Plastics;  Laterality: Bilateral;  90 min, please   MASTECTOMY Bilateral    MASTECTOMY W/ SENTINEL NODE BIOPSY Bilateral 08/08/2018   Procedure: MASTECTOMY WITH SENTINEL LYMPH NODE BIOPSY;  Surgeon: Earline Mayotte, MD;  Location: [facility];  Service: General;  Laterality:  Bilateral;   POLYPECTOMY N/A 05/10/2021   Procedure: POLYPECTOMY;  Surgeon: Midge Minium, MD;  Location: [facility];  Service: Endoscopy;  Laterality: N/A;   REMOVAL OF BILATERAL TISSUE EXPANDERS WITH PLACEMENT OF BILATERAL BREAST IMPLANTS Bilateral 11/28/2018   Procedure: REMOVAL OF BILATERAL TISSUE EXPANDERS WITH PLACEMENT OF BILATERAL BREAST IMPLANTS;  Surgeon: Peggye Form, DO;  Location: [facility];  Service: Plastics;  Laterality: Bilateral;  120 min, please    OB History     Gravida  1   Para  0   Term  0   Preterm  0   AB  0   Living         SAB  0   IAB  0   Ectopic  0   Multiple      Live Births           Obstetric Comments  1st Menstrual Cycle:  11 1st Pregnancy: 34          Home Medications    Prior to Admission medications   Medication Sig Start Date End Date Taking? Authorizing Provider  amLODipine (NORVASC) 5 MG tablet Take 1 tablet (5 mg total) by mouth daily. 05/01/23  Yes Mecum, Erin E, PA-C  lisinopril (ZESTRIL) 20 MG tablet Take 1 tablet (20 mg total) by mouth daily. 05/01/23  Yes Mecum, Erin E, PA-C  tamoxifen (NOLVADEX) 20 MG tablet TAKE 1 TABLET BY MOUTH EVERY DAY 01/09/23  Yes Jeralyn Ruths, MD    Family History Family History  Problem Relation Age of Onset   Hypertension Mother    Stroke Father 54   Breast cancer Neg Hx    Sudden Cardiac Death Mother 84       at age 58    Social History Social History   Tobacco Use   Smoking status: Never   Smokeless tobacco: Never   Tobacco comments:    Social  Vaping Use   Vaping status: Some Days   Substances: Nicotine  Substance Use Topics   Alcohol use: Yes   Drug use: Yes    Types: Marijuana    Comment: on occasion     Allergies   Peanut-containing drug products, Toradol [ketorolac tromethamine], Carrot oil, Hydrocodone, and Other   Review of Systems Review of Systems  Eyes:  Positive for discharge and redness. Negative for  photophobia, pain, itching and visual disturbance.     Physical Exam Triage Vital Signs ED Triage Vitals  Encounter Vitals Group     BP      Systolic BP Percentile      Diastolic BP Percentile      Pulse      Resp      Temp      Temp src      SpO2      Weight      Height      Head Circumference      Peak Flow      Pain Score      Pain Loc      Pain Education  Exclude from Growth Chart    No data found.  Updated Vital Signs BP (!) 134/90 (BP Location: Left Arm)   Pulse 65   Temp 98.6 F (37 C) (Oral)   Resp 16   Ht 5\' 4"  (1.626 m)   Wt 130 lb (59 kg)   LMP 09/18/2018 (Approximate) Comment: u preg negative 11/28/2018  SpO2 100%   BMI 22.31 kg/m   Visual Acuity Right Eye Distance: 20/25 Left Eye Distance: 20/20 Bilateral Distance: 20/25 (w/o correction)  Right Eye Near:   Left Eye Near:    Bilateral Near:     Physical Exam Vitals and nursing note reviewed.  Constitutional:      Appearance: Normal appearance. She is not ill-appearing.  HENT:     Head: Normocephalic and atraumatic.  Eyes:     Extraocular Movements: Extraocular movements intact.     Pupils: Pupils are equal, round, and reactive to light.     Comments: Subconjunctival hemorrhage present on the lateral aspect of the right eye.  Skin:    General: Skin is warm and dry.     Capillary Refill: Capillary refill takes less than 2 seconds.  Neurological:     General: No focal deficit present.     Mental Status: She is alert and oriented to person, place, and time.      UC Treatments / Results  Labs (all labs ordered are listed, but only abnormal results are displayed) Labs Reviewed - No data to display  EKG   Radiology No results found.  Procedures Procedures (including critical care time)  Medications Ordered in UC Medications - No data to display  Initial Impression / Assessment and Plan / UC Course  I have reviewed the triage vital signs and the nursing notes.  Pertinent  labs & imaging results that were available during my care of the patient were reviewed by me and considered in my medical decision making (see chart for details).   Patient is a very pleasant, nontoxic appearing 52 year old female presenting for evaluation of left eye redness and irritation as outlined HPI above.  As you can see in image above, the patient has a subconjunctival hemorrhage on the lateral aspect of her eye outside of the iris.  Her pupils equal round and reactive and EOMs intact.  Normal red light reflex present in the left eye.  Bulbar labral conjunctiva are unremarkable.  I have advised the patient that subconjunctival hemorrhages are benign and they will heal on their own, though they may be scary to look at.  They are a result of a ruptured capillaries on the surface of the eyeball underneath the conjunctival membrane.  If she develops any itching to her eyes she can use over-the-counter antihistamine eyedrops such as Opcon-A or Visine.  She should avoid rubbing her eye is much as possible.  Visual acuity OU 20/25 without correction, OD 20/25, OS 20/20.  Final Clinical Impressions(s) / UC Diagnoses   Final diagnoses:  Subconjunctival hemorrhage of left eye     Discharge Instructions      You have been diagnosed with a subconjunctival hemorrhage, which is a blood vessel on the surface of your eyeball that has been ruptured.  This will resolve on its own within a couple of weeks.  You may use artificial tears or antihistamine eyedrops to relieve any scratchy or gritty feeling.  Thing such as Opcon-A or Visine.  If you have any discomfort you can use over-the-counter Tylenol and or ibuprofen according to the package  instructions.  You may also apply warm compresses to the eye to help ease redness.  If you develop any change in your vision, swelling to the eye, or you develop pain in your eye you need to follow-up with an eye doctor.     ED Prescriptions   None     PDMP not reviewed this encounter.   Becky Augusta, NP 07/09/23 1046

## 2023-07-09 NOTE — ED Triage Notes (Signed)
Pt c/o L eye irritation & redness x2 days. Has tried eye drops w/o relief.

## 2023-07-09 NOTE — Discharge Instructions (Addendum)
You have been diagnosed with a subconjunctival hemorrhage, which is a blood vessel on the surface of your eyeball that has been ruptured.  This will resolve on its own within a couple of weeks.  You may use artificial tears or antihistamine eyedrops to relieve any scratchy or gritty feeling.  Thing such as Opcon-A or Visine.  If you have any discomfort you can use over-the-counter Tylenol and or ibuprofen according to the package instructions.  You may also apply warm compresses to the eye to help ease redness.  If you develop any change in your vision, swelling to the eye, or you develop pain in your eye you need to follow-up with an eye doctor.

## 2023-07-11 ENCOUNTER — Ambulatory Visit: Payer: 59 | Admitting: Family Medicine

## 2023-07-11 ENCOUNTER — Other Ambulatory Visit: Payer: Self-pay | Admitting: Oncology

## 2023-07-11 ENCOUNTER — Telehealth: Payer: Self-pay | Admitting: Nurse Practitioner

## 2023-07-11 ENCOUNTER — Other Ambulatory Visit: Payer: Self-pay | Admitting: Physician Assistant

## 2023-07-11 VITALS — BP 120/82 | HR 64 | Temp 98.1°F | Ht 64.96 in | Wt 132.6 lb

## 2023-07-11 DIAGNOSIS — H1132 Conjunctival hemorrhage, left eye: Secondary | ICD-10-CM | POA: Diagnosis not present

## 2023-07-11 DIAGNOSIS — I10 Essential (primary) hypertension: Secondary | ICD-10-CM

## 2023-07-11 MED ORDER — AMLODIPINE BESYLATE 10 MG PO TABS: 10.0000 mg | ORAL_TABLET | Freq: Every day | ORAL | 0 refills | Status: DC

## 2023-07-11 MED ORDER — LISINOPRIL 20 MG PO TABS: 20.0000 mg | ORAL_TABLET | Freq: Every day | ORAL | 0 refills | Status: DC

## 2023-07-11 NOTE — Telephone Encounter (Signed)
Pt called back again and states she needs her BP medication, and a call from the office manager..  Before I could tell her it is not time, pt hung up on me.

## 2023-07-11 NOTE — Telephone Encounter (Signed)
Returned patient's call as requested. Patient dod not answer. LMOM advising patient to return my call.

## 2023-07-11 NOTE — Telephone Encounter (Signed)
Requested medication (s) are due for refill today:   Yes  Requested medication (s) are on the active medication list:   Rosita Fire  Future visit scheduled:   Yes today at 3:20 with Rachelle   Last ordered: 03/06/2023 6 ml, 2 refills  Has an appt today.   Labs are due.     Requested Prescriptions  Pending Prescriptions Disp Refills   lisinopril (ZESTRIL) 20 MG tablet [Pharmacy Med Name: LISINOPRIL 20 MG TABLET] 90 tablet 0    Sig: TAKE 1 TABLET BY MOUTH EVERY DAY     Cardiovascular:  ACE Inhibitors Failed - 07/11/2023  9:16 AM      Failed - Cr in normal range and within 180 days    Creatinine, Ser  Date Value Ref Range Status  04/12/2022 0.90 0.57 - 1.00 mg/dL Final         Failed - K in normal range and within 180 days    Potassium  Date Value Ref Range Status  04/12/2022 4.1 3.5 - 5.2 mmol/L Final         Failed - Last BP in normal range    BP Readings from Last 1 Encounters:  07/09/23 (!) 134/90         Passed - Patient is not pregnant      Passed - Valid encounter within last 6 months    Recent Outpatient Visits           2 months ago Essential hypertension   [place] [facility] Mecum, Oswaldo Conroy, PA-C   1 year ago Annual physical exam   [place] [facility] Larae Grooms, NP   1 year ago Essential hypertension   [place] [facility] Larae Grooms, NP   2 years ago Essential hypertension   [place] [facility] Larae Grooms, NP   2 years ago Essential hypertension   [place] [facility] Larae Grooms, NP       Future Appointments             Today Pearley, Sherran Needs, NP [place] [facility]

## 2023-07-11 NOTE — Patient Instructions (Addendum)
Continue checking Blood pressure weekly and bring readings in at next visit.  Try melatonin 10 MG for sleep

## 2023-07-11 NOTE — Telephone Encounter (Signed)
Copied from CRM [id number]. Topic: General - Inquiry >> Jul 11, 2023  9:52 AM Haroldine Laws wrote: Reason for CRM: pt called asking for the office manager to call her back today .  [phone] >> Jul 11, 2023  9:54 AM Haroldine Laws wrote: Pt was very rude and used profanity

## 2023-07-11 NOTE — Assessment & Plan Note (Addendum)
Chronic, stable. BP today 120/82, CMP, will treat based on results. Will change Amlodipine from 5MG  to 10MG  daily as this is how patient has been taking it recently. Continue Lisinopril 20 MG, continue checking BP at home and bring log in at next visit. Refills sent. Return in 1 month, call sooner if concerns arise.

## 2023-07-11 NOTE — Progress Notes (Signed)
BP 120/82   Pulse 64   Temp 98.1 F (36.7 C) (Oral)   Ht 5' 4.96" (1.65 m)   Wt 132 lb 9.6 oz (60.1 kg)   LMP 09/18/2018 (Approximate) Comment: u preg negative 11/28/2018  SpO2 98%   BMI 22.09 kg/m    Subjective:    Patient ID: Cassandra Tran, female    DOB: [birth year], 52 y.o.   MRN: [id number]  HPI: Cassandra Tran is a 52 y.o. female  Chief Complaint  Patient presents with   Eye Problem   Hypertension   EYE REDNESS Eye redness that patient noticed on left sclarea after waking up, she denies strenuous activities that may have contributed to this.  Duration:4  days Involved eye:  left Onset: sudden Foreign body sensation:no Visual impairment: no Eye redness: yes Discharge: no Crusting or matting of eyelids: no Swelling: no Photophobia: no Itching: no Tearing: yes Headache: No Floaters: no does not see eye doctor URI symptoms: no Contact lens use: no Close contacts with similar problems: no Eye trauma: no Status: worse Treatments attempted: She tried visine eye drops daily  HYPERTENSION with Chronic Kidney Disease She has prescription written to take Amlodipine 5 MG and Lisinopril 20 MG. She has been taking Amlodipine 10 MG and lisinopril 20 MG. She is complaining of sob that started 6 months ago, saw oncology last in March 2024.  Hypertension status: controlled  Satisfied with current treatment? yes Duration of hypertension: chronic BP monitoring frequency:  a few times a week BP range: 130/79-80  BP medication side effects:  no Medication compliance: excellent compliance Aspirin: no Recurrent headaches: no Visual changes: yes sometimes when on the computer, wear Palpitations: no Dyspnea: yes Chest pain: no Lower extremity edema: no Dizzy/lightheaded: no   Relevant past medical, surgical, family and social history reviewed and updated as indicated. Interim medical history since our last visit reviewed. Allergies and medications reviewed and  updated.  Review of Systems  Eyes:  Positive for redness. Negative for pain, discharge, itching and visual disturbance.  Respiratory:  Positive for shortness of breath.   Cardiovascular:  Negative for chest pain, palpitations and leg swelling.  Neurological:  Negative for dizziness, light-headedness and headaches.    Per HPI unless specifically indicated above     Objective:    BP 120/82   Pulse 64   Temp 98.1 F (36.7 C) (Oral)   Ht 5' 4.96" (1.65 m)   Wt 132 lb 9.6 oz (60.1 kg)   LMP 09/18/2018 (Approximate) Comment: u preg negative 11/28/2018  SpO2 98%   BMI 22.09 kg/m   Wt Readings from Last 3 Encounters:  07/11/23 132 lb 9.6 oz (60.1 kg)  07/09/23 130 lb (59 kg)  05/01/23 134 lb 3.2 oz (60.9 kg)    Physical Exam Vitals and nursing note reviewed.  Constitutional:      General: She is awake. She is not in acute distress.    Appearance: Normal appearance. She is well-developed and well-groomed. She is not ill-appearing.  HENT:     Head: Normocephalic and atraumatic.     Right Ear: Hearing and external ear normal. No drainage.     Left Ear: Hearing and external ear normal. No drainage.     Nose: Nose normal.  Eyes:     General: Lids are normal.        Right eye: No discharge.        Left eye: No discharge.     Extraocular Movements: Extraocular movements intact.  Conjunctiva/sclera:     Right eye: No hemorrhage.    Left eye: Hemorrhage present.     Pupils: Pupils are equal, round, and reactive to light.   Cardiovascular:     Rate and Rhythm: Normal rate and regular rhythm.     Pulses:          Radial pulses are 2+ on the right side and 2+ on the left side.       Posterior tibial pulses are 2+ on the right side and 2+ on the left side.     Heart sounds: Normal heart sounds, S1 normal and S2 normal. No murmur heard.    No gallop.  Pulmonary:     Effort: Pulmonary effort is normal. No accessory muscle usage or respiratory distress.     Breath sounds: Normal  breath sounds.  Musculoskeletal:        General: Normal range of motion.     Cervical back: Full passive range of motion without pain and normal range of motion.     Right lower leg: No edema.     Left lower leg: No edema.  Skin:    General: Skin is warm and dry.     Capillary Refill: Capillary refill takes less than 2 seconds.  Neurological:     Mental Status: She is alert and oriented to person, place, and time.  Psychiatric:        Attention and Perception: Attention normal.        Mood and Affect: Mood normal.        Speech: Speech normal.        Behavior: Behavior normal. Behavior is cooperative.        Thought Content: Thought content normal.     Results for orders placed or performed in visit on 04/12/22  CBC with Differential/Platelet  Result Value Ref Range   WBC 5.4 3.4 - 10.8 x10E3/uL   RBC 4.34 3.77 - 5.28 x10E6/uL   Hemoglobin 12.1 11.1 - 15.9 g/dL   Hematocrit 16.0 10.9 - 46.6 %   MCV 87 79 - 97 fL   MCH 27.9 26.6 - 33.0 pg   MCHC 32.0 31.5 - 35.7 g/dL   RDW 32.3 55.7 - 32.2 %   Platelets 213 150 - 450 x10E3/uL   Neutrophils 60 Not Estab. %   Lymphs 28 Not Estab. %   Monocytes 8 Not Estab. %   Eos 3 Not Estab. %   Basos 1 Not Estab. %   Neutrophils Absolute 3.3 1.4 - 7.0 x10E3/uL   Lymphocytes Absolute 1.5 0.7 - 3.1 x10E3/uL   Monocytes Absolute 0.4 0.1 - 0.9 x10E3/uL   EOS (ABSOLUTE) 0.1 0.0 - 0.4 x10E3/uL   Basophils Absolute 0.1 0.0 - 0.2 x10E3/uL   Immature Granulocytes 0 Not Estab. %   Immature Grans (Abs) 0.0 0.0 - 0.1 x10E3/uL  Comprehensive metabolic panel  Result Value Ref Range   Glucose 95 70 - 99 mg/dL   BUN 10 6 - 24 mg/dL   Creatinine, Ser 0.25 0.57 - 1.00 mg/dL   eGFR 77 >42 HC/WCB/7.62   BUN/Creatinine Ratio 11 9 - 23   Sodium 143 134 - 144 mmol/L   Potassium 4.1 3.5 - 5.2 mmol/L   Chloride 103 96 - 106 mmol/L   CO2 22 20 - 29 mmol/L   Calcium 9.6 8.7 - 10.2 mg/dL   Total Protein 6.9 6.0 - 8.5 g/dL   Albumin 4.5 3.8 - 4.9 g/dL    Globulin, Total 2.4  1.5 - 4.5 g/dL   Albumin/Globulin Ratio 1.9 1.2 - 2.2   Bilirubin Total 0.2 0.0 - 1.2 mg/dL   Alkaline Phosphatase 74 44 - 121 IU/L   AST 19 0 - 40 IU/L   ALT 14 0 - 32 IU/L  Lipid panel  Result Value Ref Range   Cholesterol, Total 150 100 - 199 mg/dL   Triglycerides 82 0 - 149 mg/dL   HDL 73 >81 mg/dL   VLDL Cholesterol Cal 16 5 - 40 mg/dL   LDL Chol Calc (NIH) 61 0 - 99 mg/dL   Chol/HDL Ratio 2.1 0.0 - 4.4 ratio  TSH  Result Value Ref Range   TSH 0.862 0.450 - 4.500 uIU/mL  Urinalysis, Routine w reflex microscopic  Result Value Ref Range   Specific Gravity, UA 1.020 1.005 - 1.030   pH, UA 7.0 5.0 - 7.5   Color, UA Yellow Yellow   Appearance Ur Clear Clear   Leukocytes,UA Negative Negative   Protein,UA Negative Negative/Trace   Glucose, UA Negative Negative   Ketones, UA Negative Negative   RBC, UA Negative Negative   Bilirubin, UA Negative Negative   Urobilinogen, Ur 0.2 0.2 - 1.0 mg/dL   Nitrite, UA Negative Negative      Assessment & Plan:   Problem List Items Addressed This Visit     Essential hypertension - Primary    Chronic, stable. BP today 120/82, CMP, will treat based on results. Will change Amlodipine from 5MG  to 10MG  daily as this is how patient has been taking it recently. Continue Lisinopril 20 MG, continue checking BP at home and bring log in at next visit. Refills sent. Return in 1 month, call sooner if concerns arise.         Relevant Medications   amLODipine (NORVASC) 10 MG tablet   lisinopril (ZESTRIL) 20 MG tablet   Other Relevant Orders   Comp Met (CMET)   Urine Microalbumin w/creat. ratio   Other Visit Diagnoses     Subconjunctival hemorrhage of left eye       Acute, stable. Educated treatment involves watchful waiting, may use visine eye drops to prevent dry eye or irritation. Return if symptoms worsen.        Follow up plan: Return in about 1 month (around 08/11/2023) for BP Recheck and physical.

## 2023-07-12 ENCOUNTER — Other Ambulatory Visit: Payer: Self-pay | Admitting: Family Medicine

## 2023-07-12 LAB — COMPREHENSIVE METABOLIC PANEL
ALT: 15 [IU]/L (ref 0–32)
AST: 16 [IU]/L (ref 0–40)
Albumin: 4.4 g/dL (ref 3.8–4.9)
Alkaline Phosphatase: 60 [IU]/L (ref 44–121)
BUN/Creatinine Ratio: 15 (ref 9–23)
BUN: 16 mg/dL (ref 6–24)
Bilirubin Total: <0.2 mg/dL (ref 0.0–1.2)
CO2: 23 mmol/L (ref 20–29)
Calcium: 9.4 mg/dL (ref 8.7–10.2)
Chloride: 101 mmol/L (ref 96–106)
Creatinine, Ser: 1.09 mg/dL — ABNORMAL HIGH (ref 0.57–1.00)
Globulin, Total: 2.3 g/dL (ref 1.5–4.5)
Glucose: 100 mg/dL — ABNORMAL HIGH (ref 70–99)
Potassium: 3.7 mmol/L (ref 3.5–5.2)
Sodium: 139 mmol/L (ref 134–144)
Total Protein: 6.7 g/dL (ref 6.0–8.5)
eGFR: 61 mL/min/{1.73_m2} (ref >59)

## 2023-07-13 ENCOUNTER — Encounter: Payer: Self-pay | Admitting: Family Medicine

## 2023-07-13 LAB — MICROALBUMIN / CREATININE URINE RATIO
Creatinine, Urine: 218.2 mg/dL
Microalb/Creat Ratio: 5 mg/g{creat} (ref 0–29)
Microalbumin, Urine: 10.7 ug/mL

## 2023-07-13 NOTE — Progress Notes (Signed)
Hi Cassandra Tran, your electrolytes, kidney function, and liver function have returned as normal. There is a slight increase in your creatinine, I recommend minimal salt intake and watching the amount of salt you are consuming. Your urine microalbumin has returned as normal, kidneys remain working well. Thank you for allowing me to participate in your care.

## 2023-08-04 ENCOUNTER — Ambulatory Visit (INDEPENDENT_AMBULATORY_CARE_PROVIDER_SITE_OTHER): Payer: 59 | Admitting: Family Medicine

## 2023-08-04 VITALS — BP 130/89 | HR 62 | Temp 97.6°F | Ht 64.96 in | Wt 131.8 lb

## 2023-08-04 DIAGNOSIS — D0512 Intraductal carcinoma in situ of left breast: Secondary | ICD-10-CM

## 2023-08-04 DIAGNOSIS — R7309 Other abnormal glucose: Secondary | ICD-10-CM

## 2023-08-04 DIAGNOSIS — K644 Residual hemorrhoidal skin tags: Secondary | ICD-10-CM | POA: Diagnosis not present

## 2023-08-04 DIAGNOSIS — Z0000 Encounter for general adult medical examination without abnormal findings: Secondary | ICD-10-CM

## 2023-08-04 DIAGNOSIS — I10 Essential (primary) hypertension: Secondary | ICD-10-CM

## 2023-08-04 DIAGNOSIS — Z136 Encounter for screening for cardiovascular disorders: Secondary | ICD-10-CM

## 2023-08-04 DIAGNOSIS — F4321 Adjustment disorder with depressed mood: Secondary | ICD-10-CM | POA: Diagnosis not present

## 2023-08-04 DIAGNOSIS — N1831 Chronic kidney disease, stage 3a: Secondary | ICD-10-CM

## 2023-08-04 DIAGNOSIS — Z23 Encounter for immunization: Secondary | ICD-10-CM

## 2023-08-04 NOTE — Patient Instructions (Addendum)
Methods that will contribute to lowering your A1c include diet and exercise. It is recommended that you participate in physical activity at least 150 minutes a week, this can be 30 minutes for 5 days. In regards to diet start by dividing your plate in half with non starchy vegetables. This can include asparagus, broccoli, kale, lettuce, spinach, celery, cauliflower, and green beans. Then dividing the other half of your plate with carbohydrates that include beans (pinto, black), brown rice, squash, potatoes, bread. Choose whole wheat instead of white breads. The other half of your plate should consist of a lean protein such as chicken, salmon, tuna, [place], tilapia, eggs, and low fat cheese. Add a serving of fruit or dairy products like milk or yogurt, have low calorie drinks such as water, un sweet tea or un sweet coffee. Limit sugary foods.   Estroven over the counter for menopausal symptoms.   Melatonin over the counter to help with sleep.   Follow up with oncology before the end of the year.  Follow up with eye doctor ([facility]) Recommend to do 150 minutes weekly; 30 minutes daily   Recommend over the counter Preparation H for hemorrhoid  Try Metamucil gummy as stool softener.

## 2023-08-04 NOTE — Progress Notes (Unsigned)
BP 130/89   Pulse 62   Temp 97.6 F (36.4 C) (Oral)   Ht 5' 4.96" (1.65 m)   Wt 131 lb 12.8 oz (59.8 kg)   LMP 09/18/2018 (Approximate) Comment: u preg negative 11/28/2018  SpO2 99%   BMI 21.96 kg/m    Subjective:    Patient ID: Cassandra Tran, female    DOB: 02-Sep-1971, 52 y.o.   MRN: [id number]  HPI: Cassandra Tran is a 52 y.o. female presenting on 08/04/2023 for comprehensive medical examination. Current medical complaints include: hemrrhoids (LOA)  She currently lives with: Husband and son Menopausal Symptoms: yes hot flashes nightly  Today she complains of having an external hemorrhoid that is causing her pain daily, 7/10 that she is tolerating. Worsened when sitting and causes her not to sit for a long period of time. She denies straining with bowel movements and has bowel movement daily. Denies blood in stool, diarrhea, or constipation.  Patient says she see' stars in the morning during her shower, stands there for a little while sometimes feels light headed: monitor blood pressure.   HYPERTENSION with Chronic Kidney Disease Taking Amlodipine 10 MG and Lisinopril 20 MG daily  Hypertension status: controlled  Satisfied with current treatment? yes Duration of hypertension: chronic BP monitoring frequency:  weekly BP range: 126/79-80 BP medication side effects:  no Medication compliance: excellent compliance Aspirin: no Recurrent headaches: no Visual changes: yes has reading glasses, has not seen ophthalmologist in 2 years Palpitations: no Dyspnea: no Chest pain: no Lower extremity edema: no Dizzy/lightheaded: no   Patient had been dealing with some stress this year with death in family, and today she has complaints of work stress from not meeting requirements at work. She is interested in counseling.    Depression Screen done today and results listed below:     08/04/2023    8:45 AM 07/11/2023    4:23 PM 05/01/2023    9:30 AM 04/12/2022    8:14 AM 10/08/2021    2:16  PM  Depression screen PHQ 2/9  Decreased Interest 3 3 0 0 3  Down, Depressed, Hopeless 0 0 0 2 0  PHQ - 2 Score 3 3 0 2 3  Altered sleeping 3 3 0 2 3  Tired, decreased energy 0 0 0 1 0  Change in appetite 0 0 0 1 0  Feeling bad or failure about yourself  2 2 0 2 0  Trouble concentrating 0 0 0 1 1  Moving slowly or fidgety/restless 0 0 0 0 0  Suicidal thoughts 0 0 0 0 0  PHQ-9 Score 8 8 0 9 7  Difficult doing work/chores Not difficult at all Somewhat difficult Not difficult at all Not difficult at all Not difficult at all      08/04/2023    8:45 AM 07/11/2023    4:23 PM 05/01/2023    9:30 AM 04/12/2022    8:14 AM  GAD 7 : Generalized Anxiety Score  Nervous, Anxious, on Edge 0 0 0 0  Control/stop worrying 0 0 0 0  Worry too much - different things 0 3 0 0  Trouble relaxing 3 3 0 3  Restless 0 0 0 0  Easily annoyed or irritable 3 0 0 1  Afraid - awful might happen 0 0 0 0  Total GAD 7 Score 6 6 0 4  Anxiety Difficulty Not difficult at all Somewhat difficult Not difficult at all Somewhat difficult    GAD 6, PHQ 9: 8  The patient does not have a history of falls. I did not complete a risk assessment for falls. A plan of care for falls was documented.   Past Medical History:  Past Medical History:  Diagnosis Date   BRCA negative 12/26/2017   [facility]    Breast cancer (HCC)    Cancer (HCC) 11/20/2017   DUCTAL CARCINOMA IN SITU left breast   HTN (hypertension)    Hypertension    Tobacco abuse     Surgical History:  Past Surgical History:  Procedure Laterality Date   BREAST BIOPSY Right 02/21/2014   upper-outer calcs=atypical ductal hyperplasia and background of columnar cell change w/ calcifications. cytologic atypia per rad report from [place].   BREAST BIOPSY Right 02/21/2014   Central Right calcs=atypical ductal hyperplasia with atypical lobular hyperplasia in a background of columnar cell change with calcifications and papillary apocrine metaplasia per rad report from  [place]   BREAST BIOPSY Left 11/20/2017   Affirm Bx- DUCTAL CARCINOMA IN SITU   BREAST BIOPSY Right 11/20/2017   Affirm Bx of 2 areas-  ATYPICAL DUCTAL HYPERPLASIA and PSEUDO-ANGIOMATOUS STROMAL HYPERPLASIA   BREAST BIOPSY Bilateral 05/18/2018   Procedure: BREAST BIOPSY WITH NEEDLE LOCALIZATION;  Surgeon: Earline Mayotte, MD;  Location: [facility];  Service: General;  Laterality: Bilateral;   BREAST EXCISIONAL BIOPSY Right 2015   "Benign" per pt   BREAST EXCISIONAL BIOPSY Left 05/18/2018   NL for  lumpectomy - DCIS   BREAST EXCISIONAL BIOPSY Right 05/18/2018   NL for lumpectomy for 2 areas of atypical ductal hyperplasia    BREAST RECONSTRUCTION WITH PLACEMENT OF TISSUE EXPANDER AND FLEX HD (ACELLULAR HYDRATED DERMIS) Bilateral 08/08/2018   Procedure: BREAST RECONSTRUCTION WITH PLACEMENT OF TISSUE EXPANDER AND FLEX HD (ACELLULAR HYDRATED DERMIS);  Surgeon: Peggye Form, DO;  Location: [facility];  Service: Plastics;  Laterality: Bilateral;   CESAREAN SECTION     COLONOSCOPY N/A 05/10/2021   Procedure: COLONOSCOPY WITH BIOPSY;  Surgeon: Midge Minium, MD;  Location: [facility];  Service: Endoscopy;  Laterality: N/A;   LIPOSUCTION WITH LIPOFILLING Bilateral 03/06/2019   Procedure: Fat grafting to bilateral breast from abdomen;  Surgeon: Peggye Form, DO;  Location: [facility];  Service: Plastics;  Laterality: Bilateral;  90 min, please   MASTECTOMY Bilateral    MASTECTOMY W/ SENTINEL NODE BIOPSY Bilateral 08/08/2018   Procedure: MASTECTOMY WITH SENTINEL LYMPH NODE BIOPSY;  Surgeon: Earline Mayotte, MD;  Location: [facility];  Service: General;  Laterality: Bilateral;   POLYPECTOMY N/A 05/10/2021   Procedure: POLYPECTOMY;  Surgeon: Midge Minium, MD;  Location: [facility];  Service: Endoscopy;  Laterality: N/A;   REMOVAL OF BILATERAL TISSUE EXPANDERS WITH PLACEMENT OF BILATERAL BREAST IMPLANTS Bilateral 11/28/2018   Procedure: REMOVAL OF BILATERAL TISSUE  EXPANDERS WITH PLACEMENT OF BILATERAL BREAST IMPLANTS;  Surgeon: Peggye Form, DO;  Location: [facility];  Service: Plastics;  Laterality: Bilateral;  120 min, please    Medications:  Current Outpatient Medications on File Prior to Visit  Medication Sig   tamoxifen (NOLVADEX) 20 MG tablet TAKE 1 TABLET BY MOUTH EVERY DAY   No current facility-administered medications on file prior to visit.    Allergies:  Allergies  Allergen Reactions   Peanut-Containing Drug Products Anaphylaxis   Toradol [Ketorolac Tromethamine] Shortness Of Breath and Itching   Carrot Oil Itching and Other (See Comments)    Throat itchs   Hydrocodone     Itching, shortness of breath   Other  Green Beans    Social History:  Social History   Socioeconomic History   Marital status: Married    Spouse name: Not on file   Number of children: Not on file   Years of education: Not on file   Highest education level: Not on file  Occupational History   Not on file  Tobacco Use   Smoking status: Never   Smokeless tobacco: Never   Tobacco comments:    Social  Vaping Use   Vaping status: Some Days   Substances: Nicotine  Substance and Sexual Activity   Alcohol use: Yes   Drug use: Yes    Types: Marijuana    Comment: on occasion   Sexual activity: Not on file  Other Topics Concern   Not on file  Social History Narrative   ** Merged History Encounter **       Social Determinants of Health   Financial Resource Strain: Not on file  Food Insecurity: Not on file  Transportation Needs: Not on file  Physical Activity: Not on file  Stress: Not on file  Social Connections: Not on file  Intimate Partner Violence: Not on file   Social History   Tobacco Use  Smoking Status Never  Smokeless Tobacco Never  Tobacco Comments   Social   Social History   Substance and Sexual Activity  Alcohol Use Yes    Family History:  Family History  Problem Relation Age of Onset    Hypertension Mother    Stroke Father 16   Breast cancer Neg Hx    Sudden Cardiac Death Mother 23       at age 16    Past medical history, surgical history, medications, allergies, family history and social history reviewed with patient today and changes made to appropriate areas of the chart.   Review of Systems  Constitutional: Negative.   HENT: Negative.    Eyes: Negative.   Respiratory: Negative.    Cardiovascular: Negative.   Gastrointestinal: Negative.   Genitourinary: Negative.   Musculoskeletal: Negative.   Skin: Negative.   Neurological: Negative.   Endo/Heme/Allergies: Negative.   Psychiatric/Behavioral: Negative.     All other ROS negative except what is listed above and in the HPI.      Objective:    BP 130/89   Pulse 62   Temp 97.6 F (36.4 C) (Oral)   Ht 5' 4.96" (1.65 m)   Wt 131 lb 12.8 oz (59.8 kg)   LMP 09/18/2018 (Approximate) Comment: u preg negative 11/28/2018  SpO2 99%   BMI 21.96 kg/m   Wt Readings from Last 3 Encounters:  08/04/23 131 lb 12.8 oz (59.8 kg)  07/11/23 132 lb 9.6 oz (60.1 kg)  07/09/23 130 lb (59 kg)    Physical Exam Vitals and nursing note reviewed.  Constitutional:      General: She is awake. She is not in acute distress.    Appearance: Normal appearance. She is well-developed and well-groomed. She is not ill-appearing, toxic-appearing or diaphoretic.  HENT:     Head: Normocephalic and atraumatic.     Right Ear: Hearing and external ear normal. No drainage.     Left Ear: Hearing and external ear normal. No drainage.     Nose: Nose normal.     Mouth/Throat:     Mouth: Mucous membranes are moist.     Pharynx: Oropharynx is clear.     Tonsils: No tonsillar exudate or tonsillar abscesses.  Eyes:     General: Lids are  normal. No scleral icterus.       Right eye: No discharge.        Left eye: No discharge.     Extraocular Movements: Extraocular movements intact.     Conjunctiva/sclera: Conjunctivae normal.     Pupils:  Pupils are equal, round, and reactive to light.  Neck:     Thyroid: No thyroid mass, thyromegaly or thyroid tenderness.     Trachea: Trachea normal. No tracheal deviation.  Cardiovascular:     Rate and Rhythm: Normal rate and regular rhythm.     Pulses: Normal pulses.          Radial pulses are 2+ on the right side and 2+ on the left side.       Posterior tibial pulses are 2+ on the right side and 2+ on the left side.     Heart sounds: Normal heart sounds, S1 normal and S2 normal. No murmur heard.    No friction rub. No gallop.  Pulmonary:     Effort: Pulmonary effort is normal. No accessory muscle usage or respiratory distress.     Breath sounds: Normal breath sounds. No stridor. No wheezing, rhonchi or rales.  Chest:     Chest wall: No tenderness.  Abdominal:     General: Bowel sounds are normal.     Palpations: Abdomen is soft.     Tenderness: There is no abdominal tenderness. There is no right CVA tenderness, left CVA tenderness or guarding.  Genitourinary:    Rectum: External hemorrhoid present.     Comments: Denies tenderness of hemrrhoid Musculoskeletal:        General: Normal range of motion.     Cervical back: Full passive range of motion without pain, normal range of motion and neck supple.     Right lower leg: No edema.     Left lower leg: No edema.  Lymphadenopathy:     Cervical: No cervical adenopathy.  Skin:    General: Skin is warm and dry.     Capillary Refill: Capillary refill takes less than 2 seconds.     Coloration: Skin is not jaundiced or pale.     Findings: No bruising, erythema, lesion or rash.  Neurological:     General: No focal deficit present.     Mental Status: She is alert and oriented to person, place, and time. Mental status is at baseline.     Cranial Nerves: Cranial nerves 2-12 are intact. No cranial nerve deficit.     Sensory: No sensory deficit.     Motor: No weakness or tremor.     Coordination: Coordination is intact.     Gait: Gait is  intact.     Deep Tendon Reflexes: Reflexes are normal and symmetric.  Psychiatric:        Attention and Perception: Attention and perception normal. She does not perceive auditory or visual hallucinations.        Mood and Affect: Mood and affect normal.        Speech: Speech normal.        Behavior: Behavior normal. Behavior is cooperative.        Thought Content: Thought content normal.        Cognition and Memory: Cognition and memory normal.        Judgment: Judgment normal.     Results for orders placed or performed in visit on 07/11/23  Comp Met (CMET)  Result Value Ref Range   Glucose 100 (H) 70 - 99 mg/dL  BUN 16 6 - 24 mg/dL   Creatinine, Ser 4.09 (H) 0.57 - 1.00 mg/dL   eGFR 61 >81 XB/JYN/8.29   BUN/Creatinine Ratio 15 9 - 23   Sodium 139 134 - 144 mmol/L   Potassium 3.7 3.5 - 5.2 mmol/L   Chloride 101 96 - 106 mmol/L   CO2 23 20 - 29 mmol/L   Calcium 9.4 8.7 - 10.2 mg/dL   Total Protein 6.7 6.0 - 8.5 g/dL   Albumin 4.4 3.8 - 4.9 g/dL   Globulin, Total 2.3 1.5 - 4.5 g/dL   Bilirubin Total <5.6 0.0 - 1.2 mg/dL   Alkaline Phosphatase 60 44 - 121 IU/L   AST 16 0 - 40 IU/L   ALT 15 0 - 32 IU/L  Urine Microalbumin w/creat. ratio  Result Value Ref Range   Creatinine, Urine 218.2 Not Estab. mg/dL   Microalbumin, Urine 21.3 Not Estab. ug/mL   Microalb/Creat Ratio 5 0 - 29 mg/g creat      Assessment & Plan:   Problem List Items Addressed This Visit     Essential hypertension - Primary    Chronic, stable. BP today 130/89, CMP, will treat based on results. Continue Amlodipine 10 MG daily, Lisinopril 20 MG, checking BP at home and bring log in at next visit. Refills sent. Educated to check BP if feelings of lightheadedness occurs. Return in 3 months, call sooner if concerns arise.         Relevant Medications   amLODipine (NORVASC) 10 MG tablet   lisinopril (ZESTRIL) 20 MG tablet   Other Relevant Orders   CBC w/Diff   Lipid Panel w/o Chol/HDL Ratio   TSH + free  T4   Urinalysis, Routine w reflex microscopic   Ductal carcinoma in situ (DCIS) of left breast    Bilateral mastectomies, continue following oncology, and recommendations on Tamoxifen.       Chronic kidney disease, stage 3a (HCC)    Chronic. CMP done today. EGFR has improved over the past year. Continue Lisinopril and Amlodipine. Return in 3 months, call sooner if concerns arise.       Relevant Orders   Urinalysis, Routine w reflex microscopic   Other Visit Diagnoses     Elevated glucose       Relevant Orders   HgB A1c   Urinalysis, Routine w reflex microscopic   External hemorrhoid       Acute, ongoing. Referral to gastro. Recommend otc Preparation H if any discomfort, metamucil daily to prevent constipation or discomfort with bowel movements.   Relevant Medications   amLODipine (NORVASC) 10 MG tablet   lisinopril (ZESTRIL) 20 MG tablet   Other Relevant Orders   Ambulatory referral to Gastroenterology   Grief       PHQ 9: 8, GAD 7:6. Patient interested in counseling for this, referal sent to psychology.   Relevant Orders   Ambulatory referral to Psychology   Annual physical exam       Screening for ischemic heart disease       Lipid panel done today. Will make recommendations based on results.   Relevant Orders   Lipid Panel w/o Chol/HDL Ratio        Follow up plan: Return in about 3 months (around 11/04/2023).   LABORATORY TESTING:  - Pap smear: up to date; no history of abnormal PAP. Not due til 08/13/2025  IMMUNIZATIONS:   - Tdap: Tetanus vaccination status reviewed: last tetanus booster within 10 years. - Influenza:  Will get elsewhere -  Pneumovax: Not applicable - Prevnar: Not applicable - COVID:  Will get elsewhere - HPV: Not applicable - Shingrix vaccine: Up to date  SCREENING: -Mammogram: Patient has undergone bilateral mastectomy, therefore no further mammograms are necessary. - Colonoscopy: Up to date  - Bone Density: Not applicable  -Hearing Test:  Not applicable  -Spirometry: Not applicable   PATIENT COUNSELING:   Advised to take 1 mg of folate supplement per day if capable of pregnancy.   Sexuality: Discussed sexually transmitted diseases, partner selection, use of condoms, avoidance of unintended pregnancy  and contraceptive alternatives.   Advised to avoid cigarette smoking.  I discussed with the patient that most people either abstain from alcohol or drink within safe limits (<=14/week and <=4 drinks/occasion for males, <=7/weeks and <= 3 drinks/occasion for females) and that the risk for alcohol disorders and other health effects rises proportionally with the number of drinks per week and how often a drinker exceeds daily limits.  Discussed cessation/primary prevention of drug use and availability of treatment for abuse.   Diet: Encouraged to adjust caloric intake to maintain  or achieve ideal body weight, to reduce intake of dietary saturated fat and total fat, to limit sodium intake by avoiding high sodium foods and not adding table salt, and to maintain adequate dietary potassium and calcium preferably from fresh fruits, vegetables, and low-fat dairy products.    stressed the importance of regular exercise  Injury prevention: Discussed safety belts, safety helmets, smoke detector, smoking near bedding or upholstery.   Dental health: Discussed importance of regular tooth brushing, flossing, and dental visits.    NEXT PREVENTATIVE PHYSICAL DUE IN 1 YEAR. Return in about 3 months (around 11/04/2023).

## 2023-08-07 ENCOUNTER — Telehealth: Payer: Self-pay

## 2023-08-07 NOTE — Telephone Encounter (Signed)
Patient called and left a voicemail stating she has with our practice. The patient would like a call back. She is available in the morning time Wednesday and Thursday. I called patient back to let him know we received his message, and the patient is schedule for 09/19/23 at 2:00 pm with Dr.Anna.

## 2023-08-09 ENCOUNTER — Other Ambulatory Visit: Payer: 59

## 2023-08-09 DIAGNOSIS — I10 Essential (primary) hypertension: Secondary | ICD-10-CM

## 2023-08-09 DIAGNOSIS — R7309 Other abnormal glucose: Secondary | ICD-10-CM

## 2023-08-09 MED ORDER — LISINOPRIL 20 MG PO TABS: 20.0000 mg | ORAL_TABLET | Freq: Every day | ORAL | 1 refills | Status: DC

## 2023-08-09 MED ORDER — AMLODIPINE BESYLATE 10 MG PO TABS: 10.0000 mg | ORAL_TABLET | Freq: Every day | ORAL | 1 refills | Status: DC

## 2023-08-09 NOTE — Assessment & Plan Note (Signed)
Bilateral mastectomies, continue following oncology, and recommendations on Tamoxifen.

## 2023-08-09 NOTE — Assessment & Plan Note (Signed)
Chronic, stable. BP today 130/89, CMP, will treat based on results. Continue Amlodipine 10 MG daily, Lisinopril 20 MG, checking BP at home and bring log in at next visit. Refills sent. Educated to check BP if feelings of lightheadedness occurs. Return in 3 months, call sooner if concerns arise.

## 2023-08-09 NOTE — Assessment & Plan Note (Signed)
Chronic. CMP done today. EGFR has improved over the past year. Continue Lisinopril and Amlodipine. Return in 3 months, call sooner if concerns arise.

## 2023-08-10 LAB — TSH+FREE T4
Free T4: 1.27 ng/dL (ref 0.82–1.77)
TSH: 0.558 u[IU]/mL (ref 0.450–4.500)

## 2023-08-10 LAB — HEMOGLOBIN A1C
Est. average glucose Bld gHb Est-mCnc: 126 mg/dL
Hgb A1c MFr Bld: 6 % — ABNORMAL HIGH (ref 4.8–5.6)

## 2023-08-10 LAB — CBC WITH DIFFERENTIAL/PLATELET
Basophils Absolute: 0 10*3/uL (ref 0.0–0.2)
Basos: 1 %
EOS (ABSOLUTE): 0.1 10*3/uL (ref 0.0–0.4)
Eos: 2 %
Hematocrit: 35.5 % (ref 34.0–46.6)
Hemoglobin: 11.1 g/dL (ref 11.1–15.9)
Immature Grans (Abs): 0 10*3/uL (ref 0.0–0.1)
Immature Granulocytes: 0 %
Lymphocytes Absolute: 1.5 10*3/uL (ref 0.7–3.1)
Lymphs: 35 %
MCH: 27.3 pg (ref 26.6–33.0)
MCHC: 31.3 g/dL — ABNORMAL LOW (ref 31.5–35.7)
MCV: 87 fL (ref 79–97)
Monocytes Absolute: 0.5 10*3/uL (ref 0.1–0.9)
Monocytes: 11 %
Neutrophils Absolute: 2.2 10*3/uL (ref 1.4–7.0)
Neutrophils: 51 %
Platelets: 226 10*3/uL (ref 150–450)
RBC: 4.07 x10E6/uL (ref 3.77–5.28)
RDW: 13.5 % (ref 11.7–15.4)
WBC: 4.3 10*3/uL (ref 3.4–10.8)

## 2023-08-10 LAB — LIPID PANEL W/O CHOL/HDL RATIO
Cholesterol, Total: 157 mg/dL (ref 100–199)
HDL: 56 mg/dL (ref >39)
LDL Chol Calc (NIH): 85 mg/dL (ref 0–99)
Triglycerides: 83 mg/dL (ref 0–149)
VLDL Cholesterol Cal: 16 mg/dL (ref 5–40)

## 2023-08-11 ENCOUNTER — Telehealth: Payer: Self-pay | Admitting: Nurse Practitioner

## 2023-08-11 ENCOUNTER — Encounter: Payer: 59 | Admitting: Family Medicine

## 2023-08-11 ENCOUNTER — Encounter: Payer: Self-pay | Admitting: Family Medicine

## 2023-08-11 NOTE — Telephone Encounter (Signed)
-----   Message from Weber Cooks sent at 08/11/2023  8:30 AM EST ----- Hello, are you able to schedule a 6 month follow up for her when she is available? Thank you!

## 2023-08-11 NOTE — Progress Notes (Signed)
Hi Cassandra Tran, your lab results returned as stable. Your hemoglobin A1C returned as 6.0% putting you in the prediabetes range, this can be maintained by diet and exercise. Your CBC results are normal and stable no signs of anemia. Your thyroid levels are normal, lipid levels are normal, including the good cholesterol HDL and bad cholesterol LDL levels. Continue with current medication regimen and we will follow up in 6 months. Thank you for allowing me to participate in your care.

## 2023-08-11 NOTE — Telephone Encounter (Signed)
Called patient to schedule an appt. Left message for patient to call the office. Put in CRM for pec.

## 2023-08-15 ENCOUNTER — Inpatient Hospital Stay: Payer: BLUE CROSS/BLUE SHIELD | Attending: Oncology | Admitting: Oncology

## 2023-08-24 ENCOUNTER — Inpatient Hospital Stay (HOSPITAL_BASED_OUTPATIENT_CLINIC_OR_DEPARTMENT_OTHER): Payer: BLUE CROSS/BLUE SHIELD | Admitting: Oncology

## 2023-08-24 ENCOUNTER — Telehealth: Payer: Self-pay | Admitting: Nurse Practitioner

## 2023-08-24 DIAGNOSIS — C50411 Malignant neoplasm of upper-outer quadrant of right female breast: Secondary | ICD-10-CM | POA: Diagnosis not present

## 2023-08-24 DIAGNOSIS — D0512 Intraductal carcinoma in situ of left breast: Secondary | ICD-10-CM

## 2023-08-24 DIAGNOSIS — Z170 Estrogen receptor positive status [ER+]: Secondary | ICD-10-CM

## 2023-08-24 NOTE — Telephone Encounter (Signed)
[facility] called and spoke to Gabriel Rung, [facility] about the refill(s) lisinopril requested. Advised it was sent on 08/09/23 #90/1 refill(s). He says it was received, filled, and put back. He says it will be ready for pickup in a couple of hours.

## 2023-08-24 NOTE — Telephone Encounter (Signed)
Medication Refill -  Most Recent Primary Care Visit:  Provider: [facility]  Department: [facility]  Visit Type: LAB  Date: 08/09/2023  Medication: lisinopril (ZESTRIL) 20 MG tablet   Has the patient contacted their pharmacy? yes (Agent: If yes, when and what did the pharmacy advise?)contact pcp  Is this the correct pharmacy for this prescription? yes If no, delete pharmacy and type the correct one.  This is the patient's preferred pharmacy:  [id number] - [place], [place] - [address] [place] [address] Phone: [phone] Fax: [phone]   Has the prescription been filled recently? no  Is the patient out of the medication? yes  Has the patient been seen for an appointment in the last year OR does the patient have an upcoming appointment? yes  Can we respond through MyChart? yes  Agent: Please be advised that Rx refills may take up to 3 business days. We ask that you follow-up with your pharmacy.

## 2023-08-24 NOTE — Progress Notes (Signed)
[facility]  [phone] [phone]  ID: Cassandra Tran OB: Aug 20, 1971  MR#: [id number]  AOZ#:[id number]  Patient Care Team: Larae Grooms, NP as PCP - General Jim Like, RN as Registered Nurse Scarlett Presto, RN (Inactive) as Registered Nurse Marya Fossa, PA-C (Physician Assistant)  I connected with Cassandra Tran on 08/24/23 at  3:30 PM EST by video enabled telemedicine visit and verified that I am speaking with the correct person using two identifiers.   I discussed the limitations, risks, security and privacy concerns of performing an evaluation and management service by telemedicine and the availability of in-person appointments. I also discussed with the patient that there may be a patient responsible charge related to this service. The patient expressed understanding and agreed to proceed.   Other persons participating in the visit and their role in the encounter: Patient, MD.  Patient's location: Home. Provider's location: Clinic.  CHIEF COMPLAINT: Pathologic stage Ia ER/PR positive, HER-2 negative invasive carcinoma of the upper outer quadrant of the right breast. DCIS of left breast.  INTERVAL HISTORY: Patient last evaluated in May 2023.  She agreed to video-assisted telemedicine visit for further evaluation.  She continues to states she is compliant with tamoxifen and tolerates treatment well.  She currently feels well and is asymptomatic. She has no neurologic complaints.  She denies any recent fevers or illnesses.  She has a good appetite and denies weight loss.  She denies any chest pain, shortness of breath, cough, or hemoptysis.  She denies any nausea, vomiting, constipation, or diarrhea.  She has no urinary complaints.  Patient offers no specific complaints today.  REVIEW OF SYSTEMS:   Review of Systems  Constitutional: Negative.  Negative for fever and malaise/fatigue.  Respiratory: Negative.  Negative for cough,  hemoptysis and shortness of breath.   Cardiovascular: Negative.  Negative for chest pain and leg swelling.  Gastrointestinal: Negative.  Negative for abdominal pain.  Genitourinary: Negative.  Negative for dysuria.  Musculoskeletal: Negative.  Negative for back pain.  Skin: Negative.  Negative for rash.  Neurological: Negative.  Negative for focal weakness, weakness and headaches.  Psychiatric/Behavioral: Negative.  The patient is not nervous/anxious.     As per HPI. Otherwise, a complete review of systems is negative.  PAST MEDICAL HISTORY: Past Medical History:  Diagnosis Date   BRCA negative 12/26/2017   [facility]    Breast cancer (HCC)    Cancer (HCC) 11/20/2017   DUCTAL CARCINOMA IN SITU left breast   HTN (hypertension)    Hypertension    Tobacco abuse     PAST SURGICAL HISTORY: Past Surgical History:  Procedure Laterality Date   BREAST BIOPSY Right 02/21/2014   upper-outer calcs=atypical ductal hyperplasia and background of columnar cell change w/ calcifications. cytologic atypia per rad report from [place].   BREAST BIOPSY Right 02/21/2014   Central Right calcs=atypical ductal hyperplasia with atypical lobular hyperplasia in a background of columnar cell change with calcifications and papillary apocrine metaplasia per rad report from [place]   BREAST BIOPSY Left 11/20/2017   Affirm Bx- DUCTAL CARCINOMA IN SITU   BREAST BIOPSY Right 11/20/2017   Affirm Bx of 2 areas-  ATYPICAL DUCTAL HYPERPLASIA and PSEUDO-ANGIOMATOUS STROMAL HYPERPLASIA   BREAST BIOPSY Bilateral 05/18/2018   Procedure: BREAST BIOPSY WITH NEEDLE LOCALIZATION;  Surgeon: Earline Mayotte, MD;  Location: [facility];  Service: General;  Laterality: Bilateral;   BREAST EXCISIONAL BIOPSY Right 2015   "Benign" per pt   BREAST EXCISIONAL BIOPSY Left 05/18/2018  NL for  lumpectomy - DCIS   BREAST EXCISIONAL BIOPSY Right 05/18/2018   NL for lumpectomy for 2 areas of atypical ductal hyperplasia    BREAST  RECONSTRUCTION WITH PLACEMENT OF TISSUE EXPANDER AND FLEX HD (ACELLULAR HYDRATED DERMIS) Bilateral 08/08/2018   Procedure: BREAST RECONSTRUCTION WITH PLACEMENT OF TISSUE EXPANDER AND FLEX HD (ACELLULAR HYDRATED DERMIS);  Surgeon: Peggye Form, DO;  Location: [facility];  Service: Plastics;  Laterality: Bilateral;   CESAREAN SECTION     COLONOSCOPY N/A 05/10/2021   Procedure: COLONOSCOPY WITH BIOPSY;  Surgeon: Midge Minium, MD;  Location: [facility];  Service: Endoscopy;  Laterality: N/A;   LIPOSUCTION WITH LIPOFILLING Bilateral 03/06/2019   Procedure: Fat grafting to bilateral breast from abdomen;  Surgeon: Peggye Form, DO;  Location: [facility];  Service: Plastics;  Laterality: Bilateral;  90 min, please   MASTECTOMY Bilateral    MASTECTOMY W/ SENTINEL NODE BIOPSY Bilateral 08/08/2018   Procedure: MASTECTOMY WITH SENTINEL LYMPH NODE BIOPSY;  Surgeon: Earline Mayotte, MD;  Location: [facility];  Service: General;  Laterality: Bilateral;   POLYPECTOMY N/A 05/10/2021   Procedure: POLYPECTOMY;  Surgeon: Midge Minium, MD;  Location: [facility];  Service: Endoscopy;  Laterality: N/A;   REMOVAL OF BILATERAL TISSUE EXPANDERS WITH PLACEMENT OF BILATERAL BREAST IMPLANTS Bilateral 11/28/2018   Procedure: REMOVAL OF BILATERAL TISSUE EXPANDERS WITH PLACEMENT OF BILATERAL BREAST IMPLANTS;  Surgeon: Peggye Form, DO;  Location: [facility];  Service: Plastics;  Laterality: Bilateral;  120 min, please    FAMILY HISTORY: Family History  Problem Relation Age of Onset   Hypertension Mother    Stroke Father 67   Breast cancer Neg Hx    Sudden Cardiac Death Mother 53       at age 53    ADVANCED DIRECTIVES (Y/N):  N  HEALTH MAINTENANCE: Social History   Tobacco Use   Smoking status: Never   Smokeless tobacco: Never   Tobacco comments:    Social  Vaping Use   Vaping status: Some Days   Substances: Nicotine  Substance Use Topics   Alcohol  use: Yes   Drug use: Yes    Types: Marijuana    Comment: on occasion     Colonoscopy:  PAP:  Bone density:  Lipid panel:  Allergies  Allergen Reactions   Peanut-Containing Drug Products Anaphylaxis   Toradol [Ketorolac Tromethamine] Shortness Of Breath and Itching   Carrot Oil Itching and Other (See Comments)    Throat itchs   Hydrocodone     Itching, shortness of breath   Other     Green Beans    Current Outpatient Medications  Medication Sig Dispense Refill   amLODipine (NORVASC) 10 MG tablet Take 1 tablet (10 mg total) by mouth daily. 90 tablet 1   lisinopril (ZESTRIL) 20 MG tablet Take 1 tablet (20 mg total) by mouth daily. 90 tablet 1   tamoxifen (NOLVADEX) 20 MG tablet TAKE 1 TABLET BY MOUTH EVERY DAY 90 tablet 1   No current facility-administered medications for this visit.    OBJECTIVE: There were no vitals filed for this visit.    There is no height or weight on file to calculate BMI.    ECOG FS:0 - Asymptomatic  General: Well-developed, well-nourished, no acute distress. HEENT: Normocephalic. Neuro: Alert, answering all questions appropriately. Cranial nerves grossly intact. Psych: Normal affect.  LAB RESULTS:  Lab Results  Component Value Date   NA 139 07/11/2023   K 3.7  07/11/2023   CL 101 07/11/2023   CO2 23 07/11/2023   GLUCOSE 100 (H) 07/11/2023   BUN 16 07/11/2023   CREATININE 1.09 (H) 07/11/2023   CALCIUM 9.4 07/11/2023   PROT 6.7 07/11/2023   ALBUMIN 4.4 07/11/2023   AST 16 07/11/2023   ALT 15 07/11/2023   ALKPHOS 60 07/11/2023   BILITOT <0.2 07/11/2023   GFRNONAA 53 (L) 02/06/2021   GFRAA 72 08/13/2020    Lab Results  Component Value Date   WBC 4.3 08/09/2023   NEUTROABS 2.2 08/09/2023   HGB 11.1 08/09/2023   HCT 35.5 08/09/2023   MCV 87 08/09/2023   PLT 226 08/09/2023     STUDIES: No results found.  ASSESSMENT: Pathologic stage Ia ER/PR positive, HER-2 negative invasive carcinoma of the upper outer quadrant of the right  breast. DCIS of left breast  PLAN:    Pathologic stage Ia ER/PR positive, HER-2 negative invasive carcinoma of the upper outer quadrant of the right breast: Patient has now completed bilateral mastectomy with reconstruction.  Oncotype DX score was not able to be done secondary to insufficient tissue.  Patient did not require adjuvant chemotherapy or XRT.  Unclear of patient's compliance with tamoxifen, but she has approximately 4 months left of medication.  She has been instructed to complete what she has on hand and do not refill.  This will be approximately 5 years of treatment and no further treatment or imaging is necessary.  She does not require additional mammograms.  No further follow-up has been scheduled. DCIS of left breast: Tamoxifen as above. Breast reconstruction: Completed. Genetic testing: Negative.  I provided 20 minutes of face-to-face video visit time during this encounter which included chart review, counseling, and coordination of care as documented above.    Patient expressed understanding and was in agreement with this plan. She also understands that She can call clinic at any time with any questions, concerns, or complaints.    Cancer Staging  Malignant neoplasm of upper-outer quadrant of right breast in female, estrogen receptor positive (HCC) Staging form: Breast, AJCC 8th Edition - Clinical stage from 08/26/2018: Stage IA (cT1a, cN0, cM0, G1, ER+, PR+, HER2-) - Signed by Jeralyn Ruths, MD on 08/26/2018 Histologic grading system: 3 grade system Laterality: Right   Jeralyn Ruths, MD   08/24/2023 3:33 PM  Addendum: Insufficient tissue for further testing with Oncotype DX.  Proceed with tamoxifen as above.   Jeralyn Ruths, MD 08/24/23 3:33 PM

## 2023-09-19 ENCOUNTER — Ambulatory Visit: Payer: BLUE CROSS/BLUE SHIELD | Admitting: Gastroenterology

## 2023-10-25 ENCOUNTER — Other Ambulatory Visit: Payer: Self-pay | Admitting: *Deleted

## 2023-10-25 NOTE — Progress Notes (Signed)
I cancelled the rx of tamoxifen that was on the list and tell the pharmacy also. I checked with Orlie Dakin and make sure it is ok because the pt. Said that she would quit the tamoxifen when she finished her last she had at home. But when she got to pharmacy for another RX they were asking about her refill of the tamoxifen. Every thing is clear now. I called the pt and let her know that I have taken the tamoxifen all of her med . List as well as calling the pharmacy to take it off on pharmacy side and she he happy

## 2023-12-08 ENCOUNTER — Ambulatory Visit: Payer: Medicaid Other | Admitting: Family Medicine

## 2024-02-01 ENCOUNTER — Other Ambulatory Visit: Payer: Self-pay | Admitting: Nurse Practitioner

## 2024-02-01 ENCOUNTER — Other Ambulatory Visit: Payer: Self-pay

## 2024-02-01 MED ORDER — LISINOPRIL 20 MG PO TABS: 20.0000 mg | ORAL_TABLET | Freq: Every day | ORAL | 0 refills | Status: DC

## 2024-02-01 MED ORDER — AMLODIPINE BESYLATE 10 MG PO TABS: 10.0000 mg | ORAL_TABLET | Freq: Every day | ORAL | 0 refills | Status: DC

## 2024-02-01 NOTE — Telephone Encounter (Signed)
 Copied from CRM [id number]. Topic: Clinical - Medication Refill >> Feb 01, 2024  1:08 PM Jeris Montes S wrote: Most Recent Primary Care Visit:  Provider: [facility]  Department: [facility]  Visit Type: LAB  Date: 08/09/2023  Medication: amLODipine  (NORVASC ) 10 MG tablet lisinopril  (ZESTRIL ) 20 MG tablet   Has the patient contacted their pharmacy? Yes (Agent: If no, request that the patient contact the pharmacy for the refill. If patient does not wish to contact the pharmacy document the reason why and proceed with request.) (Agent: If yes, when and what did the pharmacy advise?) Call PCP office  Is this the correct pharmacy for this prescription? Yes If no, delete pharmacy and type the correct one.  This is the patient's preferred pharmacy:  [id number] - [place],  - [address] [place] [address] Phone: [phone] Fax: [phone]   Has the prescription been filled recently? No  Is the patient out of the medication? Yes  Has the patient been seen for an appointment in the last year OR does the patient have an upcoming appointment? No  Can we respond through MyChart? No  Agent: Please be advised that Rx refills may take up to 3 business days. We ask that you follow-up with your pharmacy.

## 2024-02-01 NOTE — Telephone Encounter (Signed)
 I sent her prescriptions in for 30 days.  Her appt needs to be rescheduled with me.

## 2024-02-01 NOTE — Telephone Encounter (Signed)
 Patient has upcoming appointment on 02/22/24.

## 2024-02-05 NOTE — Telephone Encounter (Signed)
 Refused amlodipine  and lisinopril  because Aileen Alexanders sent in a #30 day supply on 02/01/2024.   Pt has an appt coming up for 02/22/2024.   Needs to be seen for any more refills.

## 2024-02-06 NOTE — Telephone Encounter (Signed)
 Tried calling patient, no answer and VM full. Will try to call again.   OK for E2C2 to speak with the patient and reschedule her upcoming appointment to an appointment with her PCP and not Dr. Juliette Oh.

## 2024-02-22 ENCOUNTER — Ambulatory Visit: Admitting: Pediatrics

## 2024-03-21 ENCOUNTER — Ambulatory Visit: Admitting: Pediatrics

## 2024-03-21 ENCOUNTER — Encounter: Payer: Self-pay | Admitting: Pediatrics

## 2024-03-21 ENCOUNTER — Telehealth: Payer: Self-pay | Admitting: Pediatrics

## 2024-03-21 VITALS — BP 155/93 | HR 42 | Temp 97.9°F | Wt 133.0 lb

## 2024-03-21 DIAGNOSIS — I10 Essential (primary) hypertension: Secondary | ICD-10-CM

## 2024-03-21 MED ORDER — AMLODIPINE BESYLATE 10 MG PO TABS: 10.0000 mg | ORAL_TABLET | Freq: Every day | ORAL | 2 refills | Status: DC

## 2024-03-21 MED ORDER — OLMESARTAN MEDOXOMIL 20 MG PO TABS
20.0000 mg | ORAL_TABLET | Freq: Every day | ORAL | 2 refills | Status: DC
Start: 2024-03-21 — End: 2024-04-25

## 2024-03-21 NOTE — Progress Notes (Unsigned)
   Office Visit  BP (!) 155/93   Pulse (!) 42   Temp 97.9 F (36.6 C) (Oral)   Wt 133 lb (60.3 kg)   LMP 09/18/2018 (Approximate) Comment: u preg negative 11/28/2018  SpO2 99%   BMI 22.16 kg/m    Subjective:    Patient ID: Alanna Hu, female    DOB: 07/10/1971, 53 y.o.   MRN: [id number]  HPI: Keyry Iracheta is a 53 y.o. female  Chief Complaint  Patient presents with  . Medication Refill    On blood pressure medicaids    Discussed the use of AI scribe software for clinical note transcription with the patient, who gave verbal consent to proceed.  History of Present Illness     Relevant past medical, surgical, family and social history reviewed and updated as indicated. Interim medical history since our last visit reviewed. Allergies and medications reviewed and updated.  ROS per HPI unless specifically indicated above     Objective:    BP (!) 155/93   Pulse (!) 42   Temp 97.9 F (36.6 C) (Oral)   Wt 133 lb (60.3 kg)   LMP 09/18/2018 (Approximate) Comment: u preg negative 11/28/2018  SpO2 99%   BMI 22.16 kg/m   Wt Readings from Last 3 Encounters:  03/21/24 133 lb (60.3 kg)  08/04/23 131 lb 12.8 oz (59.8 kg)  07/11/23 132 lb 9.6 oz (60.1 kg)     Physical Exam      03/21/2024   10:19 AM 08/04/2023    8:45 AM 07/11/2023    4:23 PM 05/01/2023    9:30 AM 04/12/2022    8:14 AM  Depression screen PHQ 2/9  Decreased Interest 0 3 3 0 0  Down, Depressed, Hopeless 0 0 0 0 2  PHQ - 2 Score 0 3 3 0 2  Altered sleeping 0 3 3 0 2  Tired, decreased energy 0 0 0 0 1  Change in appetite 0 0 0 0 1  Feeling bad or failure about yourself  0 2 2 0 2  Trouble concentrating 0 0 0 0 1  Moving slowly or fidgety/restless 0 0 0 0 0  Suicidal thoughts 0 0 0 0 0  PHQ-9 Score 0 8 8 0 9  Difficult doing work/chores Not difficult at all Not difficult at all Somewhat difficult Not difficult at all Not difficult at all       03/21/2024   10:20 AM 08/04/2023    8:45 AM 07/11/2023     4:23 PM 05/01/2023    9:30 AM  GAD 7 : Generalized Anxiety Score  Nervous, Anxious, on Edge 0 0 0 0  Control/stop worrying 0 0 0 0  Worry too much - different things 0 0 3 0  Trouble relaxing 0 3 3 0  Restless 0 0 0 0  Easily annoyed or irritable 0 3 0 0  Afraid - awful might happen 0 0 0 0  Total GAD 7 Score 0 6 6 0  Anxiety Difficulty Not difficult at all Not difficult at all Somewhat difficult Not difficult at all       Assessment & Plan:  Assessment & Plan   There are no diagnoses linked to this encounter.   Assessment and Plan Assessment & Plan      Follow up plan: No follow-ups on file.  Hadassah Letters, MD

## 2024-03-21 NOTE — Telephone Encounter (Signed)
 Copied from CRM [id number]. Topic: General - Other >> Mar 20, 2024  2:29 PM Felizardo Hotter wrote: Reason for CRM: Pt would like a transfer of care from Aileen Alexanders NP to Terre Ferri Payseur DO. Pt wants a call [phone] back for confirmation.

## 2024-03-21 NOTE — Patient Instructions (Addendum)
 Amlodipine  10mg  New one replacing lisinopril : olmesartan 20mg   Measure your blood pressure at home! Home Blood Pressure Monitoring is an important part of managing blood pressure and thought to be more accurate than the measures we get in the clinic.  Here's some tips on how to take your blood pressure accurately at home and some highly rated monitors. Most insurances (except for Medicaid) won't pay for monitors, so unfortunately they are an out-of-pocket expense for most people.  Taking an accurate blood pressure measurement: To get an accurate blood pressure reading, empty your bladder first, then rest in a seated position for at least 5 minutes. Ideally, no caffeine or tobacco use in last 30 minutes. Use an arm cuff (not wrist - see recommendations below) seated in a chair with a back next to a table or object that is high enough that you can rest your arm so the blood pressure cuff is at the level of your heart and you can lean back comfortably. Keep both feet on the floor and don't talk while the machine is working. Checking at different times of the day can be helpful to get an idea of your average numbers. Your goal blood pressure should be <140/90.

## 2024-03-22 NOTE — Telephone Encounter (Signed)
 error

## 2024-03-27 ENCOUNTER — Encounter: Payer: Self-pay | Admitting: Pediatrics

## 2024-03-27 NOTE — Assessment & Plan Note (Signed)
 Hypertension uncontrolled due to medication lapse. Restarting medication with amlodipine  and olmesartan  for efficacy and renal protection. Olmesartan  preferred for longer action. - Prescribe amlodipine  10 mg and olmesartan  20 mg. - Instruct her to monitor blood pressure at home. - Schedule follow-up in 2-3 weeks to assess control and adjust medication. - Consider combination pill if blood pressure is controlled. - Discuss lisinopril  if olmesartan  is not tolerated.

## 2024-04-25 ENCOUNTER — Encounter: Payer: Self-pay | Admitting: Pediatrics

## 2024-04-25 ENCOUNTER — Ambulatory Visit: Admitting: Pediatrics

## 2024-04-25 VITALS — BP 144/99 | HR 58 | Temp 98.2°F | Wt 119.6 lb

## 2024-04-25 DIAGNOSIS — I10 Essential (primary) hypertension: Secondary | ICD-10-CM

## 2024-04-25 DIAGNOSIS — R7309 Other abnormal glucose: Secondary | ICD-10-CM

## 2024-04-25 MED ORDER — OLMESARTAN MEDOXOMIL 40 MG PO TABS
40.0000 mg | ORAL_TABLET | Freq: Every day | ORAL | 3 refills | Status: AC
Start: 2024-04-25 — End: ?

## 2024-04-25 NOTE — Progress Notes (Signed)
 Office Visit  BP (!) 144/99   Pulse (!) 58   Temp 98.2 F (36.8 C) (Oral)   Wt 119 lb 9.6 oz (54.3 kg)   LMP 09/18/2018 (Approximate) Comment: u preg negative 11/28/2018  SpO2 100%   BMI 19.93 kg/m    Subjective:    Patient ID: Cassandra Tran, female    DOB: [birth year], 53 y.o.   MRN: [id number]  HPI: Cassandra Tran is a 53 y.o. female  Chief Complaint  Patient presents with   Hypertension    Discussed the use of AI scribe software for clinical note transcription with the patient, who gave verbal consent to proceed.  History of Present Illness   Cassandra Tran is a 53 year old female with hypertension who presents for medication management.  She has been taking olmesartan  20 mg once daily but was not taking amlodipine  due to a misunderstanding, thinking she was only supposed to take one medication. Her home blood pressure readings have been around 120/80 mmHg, which is better than the readings in the clinic. Asymptomatic.  She regularly monitors her blood pressure at home using a blood pressure cuff.    Relevant past medical, surgical, family and social history reviewed and updated as indicated. Interim medical history since our last visit reviewed. Allergies and medications reviewed and updated.  ROS per HPI unless specifically indicated above     Objective:    BP (!) 144/99   Pulse (!) 58   Temp 98.2 F (36.8 C) (Oral)   Wt 119 lb 9.6 oz (54.3 kg)   LMP 09/18/2018 (Approximate) Comment: u preg negative 11/28/2018  SpO2 100%   BMI 19.93 kg/m   Wt Readings from Last 3 Encounters:  04/25/24 119 lb 9.6 oz (54.3 kg)  03/21/24 133 lb (60.3 kg)  08/04/23 131 lb 12.8 oz (59.8 kg)     Physical Exam Constitutional:      Appearance: Normal appearance.  Cardiovascular:     Rate and Rhythm: Normal rate and regular rhythm.     Pulses: Normal pulses.     Heart sounds: Normal heart sounds.  Pulmonary:     Effort: Pulmonary effort is normal.     Breath sounds: Normal  breath sounds.  Musculoskeletal:        General: Normal range of motion.  Skin:    Comments: Normal skin color  Neurological:     General: No focal deficit present.     Mental Status: She is alert. Mental status is at baseline.  Psychiatric:        Mood and Affect: Mood normal.        Behavior: Behavior normal.        Thought Content: Thought content normal.         04/25/2024    9:30 AM 03/21/2024   10:19 AM 08/04/2023    8:45 AM 07/11/2023    4:23 PM 05/01/2023    9:30 AM  Depression screen PHQ 2/9  Decreased Interest 0 0 3 3 0  Down, Depressed, Hopeless 0 0 0 0 0  PHQ - 2 Score 0 0 3 3 0  Altered sleeping 0 0 3 3 0  Tired, decreased energy 0 0 0 0 0  Change in appetite 0 0 0 0 0  Feeling bad or failure about yourself  0 0 2 2 0  Trouble concentrating 0 0 0 0 0  Moving slowly or fidgety/restless 0 0 0 0 0  Suicidal thoughts 0 0 0 0 0  PHQ-9 Score 0 0 8 8 0  Difficult doing work/chores Not difficult at all Not difficult at all Not difficult at all Somewhat difficult Not difficult at all       04/25/2024    9:31 AM 03/21/2024   10:20 AM 08/04/2023    8:45 AM 07/11/2023    4:23 PM  GAD 7 : Generalized Anxiety Score  Nervous, Anxious, on Edge 0 0 0 0  Control/stop worrying 0 0 0 0  Worry too much - different things 0 0 0 3  Trouble relaxing 0 0 3 3  Restless 0 0 0 0  Easily annoyed or irritable 0 0 3 0  Afraid - awful might happen 0 0 0 0  Total GAD 7 Score 0 0 6 6  Anxiety Difficulty Not difficult at all Not difficult at all Not difficult at all Somewhat difficult       Assessment & Plan:  Assessment & Plan   Essential hypertension Assessment & Plan: Blood pressure is slightly above target due to a misunderstanding about amlodipine , though home readings are within the desired range. Optimize control with olmesartan  to simplify regimen. Increase olmesartan  to 40 mg by taking two 20 mg pills daily. Discontinue amlodipine . Monitor blood pressure at home and bring the  cuff to the next appointment. Schedule a follow-up in four weeks to reassess blood pressure control.  Orders: -     Olmesartan  Medoxomil; Take 1 tablet (40 mg total) by mouth daily.  Dispense: 90 tablet; Refill: 3 -     CBC with Differential/Platelet -     Comprehensive metabolic panel with GFR  Elevated hemoglobin A1c A1c was previously slightly elevated and is being monitored as part of routine blood work. Order blood work including A1c, electrolytes, and kidney function tests. -     Lipid panel -     Hemoglobin A1c    Follow up plan: Return in about 4 weeks (around 05/23/2024) for HTN.  Hadassah SHAUNNA Nett, MD

## 2024-04-25 NOTE — Patient Instructions (Signed)
 Increase olmesartan to 40 mg

## 2024-04-25 NOTE — Assessment & Plan Note (Signed)
 Blood pressure is slightly above target due to a misunderstanding about amlodipine , though home readings are within the desired range. Optimize control with olmesartan . Increase olmesartan  to 40 mg by taking two 20 mg pills daily. Discontinue amlodipine . Monitor blood pressure at home and bring the cuff to the next appointment. Schedule a follow-up in four weeks to reassess blood pressure control.

## 2024-04-26 ENCOUNTER — Ambulatory Visit: Payer: Self-pay | Admitting: Pediatrics

## 2024-04-26 LAB — CBC WITH DIFFERENTIAL/PLATELET
Basophils Absolute: 0 x10E3/uL (ref 0.0–0.2)
Basos: 1 %
EOS (ABSOLUTE): 0.1 x10E3/uL (ref 0.0–0.4)
Eos: 3 %
Hematocrit: 37.7 % (ref 34.0–46.6)
Hemoglobin: 11.7 g/dL (ref 11.1–15.9)
Immature Grans (Abs): 0 x10E3/uL (ref 0.0–0.1)
Immature Granulocytes: 0 %
Lymphocytes Absolute: 1.3 x10E3/uL (ref 0.7–3.1)
Lymphs: 31 %
MCH: 27.8 pg (ref 26.6–33.0)
MCHC: 31 g/dL — ABNORMAL LOW (ref 31.5–35.7)
MCV: 90 fL (ref 79–97)
Monocytes Absolute: 0.3 x10E3/uL (ref 0.1–0.9)
Monocytes: 7 %
Neutrophils Absolute: 2.5 x10E3/uL (ref 1.4–7.0)
Neutrophils: 58 %
Platelets: 211 x10E3/uL (ref 150–450)
RBC: 4.21 x10E6/uL (ref 3.77–5.28)
RDW: 13.2 % (ref 11.7–15.4)
WBC: 4.4 x10E3/uL (ref 3.4–10.8)

## 2024-04-26 LAB — LIPID PANEL
Chol/HDL Ratio: 2.3 ratio (ref 0.0–4.4)
Cholesterol, Total: 141 mg/dL (ref 100–199)
HDL: 61 mg/dL (ref >39)
LDL Chol Calc (NIH): 67 mg/dL (ref 0–99)
Triglycerides: 63 mg/dL (ref 0–149)
VLDL Cholesterol Cal: 13 mg/dL (ref 5–40)

## 2024-04-26 LAB — COMPREHENSIVE METABOLIC PANEL WITH GFR
ALT: 17 IU/L (ref 0–32)
AST: 19 IU/L (ref 0–40)
Albumin: 4.1 g/dL (ref 3.8–4.9)
Alkaline Phosphatase: 82 IU/L (ref 44–121)
BUN/Creatinine Ratio: 10 (ref 9–23)
BUN: 11 mg/dL (ref 6–24)
Bilirubin Total: <0.2 mg/dL (ref 0.0–1.2)
CO2: 25 mmol/L (ref 20–29)
Calcium: 9.3 mg/dL (ref 8.7–10.2)
Chloride: 104 mmol/L (ref 96–106)
Creatinine, Ser: 1.12 mg/dL — ABNORMAL HIGH (ref 0.57–1.00)
Globulin, Total: 2.3 g/dL (ref 1.5–4.5)
Glucose: 87 mg/dL (ref 70–99)
Potassium: 3.9 mmol/L (ref 3.5–5.2)
Sodium: 145 mmol/L — ABNORMAL HIGH (ref 134–144)
Total Protein: 6.4 g/dL (ref 6.0–8.5)
eGFR: 58 mL/min/1.73 — ABNORMAL LOW (ref >59)

## 2024-04-26 LAB — HEMOGLOBIN A1C
Est. average glucose Bld gHb Est-mCnc: 114 mg/dL
Hgb A1c MFr Bld: 5.6 % (ref 4.8–5.6)

## 2024-05-09 ENCOUNTER — Other Ambulatory Visit: Payer: Self-pay | Admitting: Pediatrics

## 2024-05-09 ENCOUNTER — Telehealth: Payer: Self-pay

## 2024-05-09 DIAGNOSIS — Z111 Encounter for screening for respiratory tuberculosis: Secondary | ICD-10-CM

## 2024-05-09 NOTE — Telephone Encounter (Signed)
 Attempted to contact pt to schedule lab visit no answer left vm for a return call.  Okay for E2C2 to schedule pt for a lab appointment for TB screening if the call is returned

## 2024-05-09 NOTE — Progress Notes (Signed)
 TB screen requesting for health assessment for foster care. Future order placed.   Cassandra SHAUNNA Nett, MD

## 2024-05-09 NOTE — Telephone Encounter (Signed)
 Patient brought in staff health assessment form to be completed by provider. On chart review last physical was 04/12/2022. Sending to provider for review.

## 2024-05-10 NOTE — Telephone Encounter (Signed)
Second attempt to reach pt.

## 2024-05-13 ENCOUNTER — Other Ambulatory Visit

## 2024-05-13 DIAGNOSIS — Z111 Encounter for screening for respiratory tuberculosis: Secondary | ICD-10-CM | POA: Diagnosis not present

## 2024-05-13 NOTE — Telephone Encounter (Signed)
 Pt came in office to complete labs today 8/11

## 2024-05-15 ENCOUNTER — Telehealth: Payer: Self-pay

## 2024-05-15 NOTE — Telephone Encounter (Signed)
 Attempted to reach pt to inform her that labs has not came back yet could take up to 4 days to result once they have returned we will contact her to pick up her completed paperwork.  Okay for E2C2 to go over with patient if call is returned

## 2024-05-15 NOTE — Telephone Encounter (Signed)
     Copied from CRM [id number]. Topic: Clinical - Lab/Test Results >> May 15, 2024  9:58 AM Montie POUR wrote: Reason for CRM:  [name] needs to get her QuantiFERON-TB results and also needs to pick up the medical records form that she dropped off. She needs these by 05/16/24 so she can turn them into her work. Please call her to discuss at [phone].

## 2024-05-16 ENCOUNTER — Ambulatory Visit: Payer: Self-pay | Admitting: Pediatrics

## 2024-05-16 LAB — QUANTIFERON-TB GOLD PLUS
QuantiFERON Mitogen Value: >10 [IU]/mL
QuantiFERON Nil Value: 0.06 [IU]/mL
QuantiFERON TB1 Ag Value: 0.05 [IU]/mL
QuantiFERON TB2 Ag Value: 0.09 [IU]/mL
QuantiFERON-TB Gold Plus: NEGATIVE

## 2024-05-23 ENCOUNTER — Ambulatory Visit: Admitting: Pediatrics

## 2024-06-16 ENCOUNTER — Other Ambulatory Visit: Payer: Self-pay | Admitting: Pediatrics

## 2024-06-16 DIAGNOSIS — I10 Essential (primary) hypertension: Secondary | ICD-10-CM
